# Patient Record
Sex: Female | Born: 1995 | Race: White | Hispanic: No | Smoking: Never smoker
Health system: Southern US, Community
[De-identification: ages and names within clinical notes are randomized; demographics above are authoritative.]

## PROBLEM LIST (undated history)

## (undated) ENCOUNTER — Inpatient Hospital Stay: Payer: Self-pay

## (undated) DIAGNOSIS — G930 Cerebral cysts: Secondary | ICD-10-CM

## (undated) DIAGNOSIS — F419 Anxiety disorder, unspecified: Secondary | ICD-10-CM

## (undated) DIAGNOSIS — M419 Scoliosis, unspecified: Secondary | ICD-10-CM

## (undated) DIAGNOSIS — R55 Syncope and collapse: Secondary | ICD-10-CM

## (undated) DIAGNOSIS — G43909 Migraine, unspecified, not intractable, without status migrainosus: Secondary | ICD-10-CM

## (undated) DIAGNOSIS — A1801 Tuberculosis of spine: Secondary | ICD-10-CM

## (undated) DIAGNOSIS — S82009A Unspecified fracture of unspecified patella, initial encounter for closed fracture: Secondary | ICD-10-CM

## (undated) DIAGNOSIS — R519 Headache, unspecified: Secondary | ICD-10-CM

## (undated) DIAGNOSIS — R000 Tachycardia, unspecified: Secondary | ICD-10-CM

## (undated) DIAGNOSIS — Z8489 Family history of other specified conditions: Secondary | ICD-10-CM

## (undated) DIAGNOSIS — Z87440 Personal history of urinary (tract) infections: Secondary | ICD-10-CM

## (undated) DIAGNOSIS — R569 Unspecified convulsions: Secondary | ICD-10-CM

## (undated) DIAGNOSIS — I499 Cardiac arrhythmia, unspecified: Secondary | ICD-10-CM

## (undated) DIAGNOSIS — R45851 Suicidal ideations: Secondary | ICD-10-CM

## (undated) DIAGNOSIS — R51 Headache: Secondary | ICD-10-CM

## (undated) DIAGNOSIS — R011 Cardiac murmur, unspecified: Secondary | ICD-10-CM

## (undated) HISTORY — DX: Unspecified fracture of unspecified patella, initial encounter for closed fracture: S82.009A

## (undated) HISTORY — DX: Migraine, unspecified, not intractable, without status migrainosus: G43.909

## (undated) HISTORY — DX: Tachycardia, unspecified: R00.0

## (undated) HISTORY — DX: Cardiac murmur, unspecified: R01.1

## (undated) HISTORY — DX: Syncope and collapse: R55

## (undated) HISTORY — DX: Scoliosis, unspecified: M41.9

## (undated) HISTORY — DX: Personal history of urinary (tract) infections: Z87.440

## (undated) HISTORY — DX: Suicidal ideations: R45.851

## (undated) HISTORY — DX: Headache: R51

## (undated) HISTORY — DX: Headache, unspecified: R51.9

---
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
## 2015-01-07 ENCOUNTER — Ambulatory Visit (INDEPENDENT_AMBULATORY_CARE_PROVIDER_SITE_OTHER): Payer: 59 | Admitting: Obstetrics and Gynecology

## 2015-01-07 ENCOUNTER — Encounter: Payer: Self-pay | Admitting: Obstetrics and Gynecology

## 2015-01-07 VITALS — BP 106/71 | HR 76 | Wt 168.2 lb

## 2015-01-07 DIAGNOSIS — Z3493 Encounter for supervision of normal pregnancy, unspecified, third trimester: Secondary | ICD-10-CM

## 2015-01-07 LAB — POCT URINALYSIS DIPSTICK
Bilirubin, UA: NEGATIVE
Blood, UA: NEGATIVE
Glucose, UA: NEGATIVE
Ketones, UA: NEGATIVE
LEUKOCYTES UA: NEGATIVE
Nitrite, UA: NEGATIVE
Spec Grav, UA: 1.01
UROBILINOGEN UA: 0.2
pH, UA: 8

## 2015-01-07 NOTE — Patient Instructions (Signed)

## 2015-01-07 NOTE — Progress Notes (Signed)
ROB- c/o fatigue- OK to take extra B12, glucola next visit- info given to enroll in [facility], [facility] and infant care class

## 2015-01-07 NOTE — Progress Notes (Signed)
ROB- pt thinks she has internal hemorrhoid, she is having some pain in R groin

## 2015-01-21 ENCOUNTER — Observation Stay
Admission: EM | Admit: 2015-01-21 | Discharge: 2015-01-21 | Disposition: A | Discharge status: Home / Self Care | Payer: Medicaid Other | Attending: Obstetrics and Gynecology | Admitting: Obstetrics and Gynecology

## 2015-01-21 DIAGNOSIS — Z3A27 27 weeks gestation of pregnancy: Secondary | ICD-10-CM | POA: Diagnosis not present

## 2015-01-21 DIAGNOSIS — O368120 Decreased fetal movements, second trimester, not applicable or unspecified: Secondary | ICD-10-CM | POA: Diagnosis present

## 2015-01-21 NOTE — Discharge Instructions (Signed)
Get lots of rest and drink plenty of water. Call your doctor with any questions or concerns.  Braxton Hicks Contractions Contractions of the uterus can occur throughout pregnancy. Contractions are not always a sign that you are in labor.  WHAT ARE BRAXTON HICKS CONTRACTIONS?  Contractions that occur before labor are called Braxton Hicks contractions, or false labor. Toward the end of pregnancy (32-34 weeks), these contractions can develop more often and may become more forceful. This is not true labor because these contractions do not result in opening (dilatation) and thinning of the cervix. They are sometimes difficult to tell apart from true labor because these contractions can be forceful and people have different pain tolerances. You should not feel embarrassed if you go to the hospital with false labor. Sometimes, the only way to tell if you are in true labor is for your health care provider to look for changes in the cervix. If there are no prenatal problems or other health problems associated with the pregnancy, it is completely safe to be sent home with false labor and await the onset of true labor. HOW CAN YOU TELL THE DIFFERENCE BETWEEN TRUE AND FALSE LABOR? False Labor  The contractions of false labor are usually shorter and not as hard as those of true labor.   The contractions are usually irregular.   The contractions are often felt in the front of the lower abdomen and in the groin.   The contractions may go away when you walk around or change positions while lying down.   The contractions get weaker and are shorter lasting as time goes on.   The contractions do not usually become progressively stronger, regular, and closer together as with true labor.  True Labor  Contractions in true labor last 30-70 seconds, become very regular, usually become more intense, and increase in frequency.   The contractions do not go away with walking.   The discomfort is usually felt  in the top of the uterus and spreads to the lower abdomen and low back.   True labor can be determined by your health care provider with an exam. This will show that the cervix is dilating and getting thinner.  WHAT TO REMEMBER  Keep up with your usual exercises and follow other instructions given by your health care provider.   Take medicines as directed by your health care provider.   Keep your regular prenatal appointments.   Eat and drink lightly if you think you are going into labor.   If Braxton Hicks contractions are making you uncomfortable:   Change your position from lying down or resting to walking, or from walking to resting.   Sit and rest in a tub of warm water.   Drink 2-3 glasses of water. Dehydration may cause these contractions.   Do slow and deep breathing several times an hour.  WHEN SHOULD I SEEK IMMEDIATE MEDICAL CARE? Seek immediate medical care if:  Your contractions become stronger, more regular, and closer together.   You have fluid leaking or gushing from your vagina.   You have a fever.   You pass blood-tinged mucus.   You have vaginal bleeding.   You have continuous abdominal pain.   You have low back pain that you never had before.   You feel your baby's head pushing down and causing pelvic pressure.   Your baby is not moving as much as it used to.    This information is not intended to replace advice given to  you by your health care provider. Make sure you discuss any questions you have with your health care provider.   Document Released: 02/07/2005 Document Revised: 02/12/2013 Document Reviewed: 11/19/2012 Elsevier Interactive Patient Education 2016 [place]. Fetal Movement Counts Patient Name: __________________________________________________ Patient Due Date: ____________________ Performing a fetal movement count is highly recommended in high-risk pregnancies, but it is good for every pregnant woman to do.  Your health care provider may ask you to start counting fetal movements at 28 weeks of the pregnancy. Fetal movements often increase:  After eating a full meal.  After physical activity.  After eating or drinking something sweet or cold.  At rest. Pay attention to when you feel the baby is most active. This will help you notice a pattern of your baby's sleep and wake cycles and what factors contribute to an increase in fetal movement. It is important to perform a fetal movement count at the same time each day when your baby is normally most active.  HOW TO COUNT FETAL MOVEMENTS  Find a quiet and comfortable area to sit or lie down on your left side. Lying on your left side provides the best blood and oxygen circulation to your baby.  Write down the day and time on a sheet of paper or in a journal.  Start counting kicks, flutters, swishes, rolls, or jabs in a 2-hour period. You should feel at least 10 movements within 2 hours.  If you do not feel 10 movements in 2 hours, wait 2-3 hours and count again. Look for a change in the pattern or not enough counts in 2 hours. SEEK MEDICAL CARE IF:  You feel less than 10 counts in 2 hours, tried twice.  There is no movement in over an hour.  The pattern is changing or taking longer each day to reach 10 counts in 2 hours.  You feel the baby is not moving as he or she usually does. Date: ____________ Movements: ____________ Start time: ____________ Elizebeth Koller time: ____________  Date: ____________ Movements: ____________ Start time: ____________ Elizebeth Koller time: ____________ Date: ____________ Movements: ____________ Start time: ____________ Elizebeth Koller time: ____________ Date: ____________ Movements: ____________ Start time: ____________ Elizebeth Koller time: ____________ Date: ____________ Movements: ____________ Start time: ____________ Elizebeth Koller time: ____________ Date: ____________ Movements: ____________ Start time: ____________ Elizebeth Koller time: ____________ Date:  ____________ Movements: ____________ Start time: ____________ Elizebeth Koller time: ____________ Date: ____________ Movements: ____________ Start time: ____________ Elizebeth Koller time: ____________  Date: ____________ Movements: ____________ Start time: ____________ Elizebeth Koller time: ____________ Date: ____________ Movements: ____________ Start time: ____________ Elizebeth Koller time: ____________ Date: ____________ Movements: ____________ Start time: ____________ Elizebeth Koller time: ____________ Date: ____________ Movements: ____________ Start time: ____________ Elizebeth Koller time: ____________ Date: ____________ Movements: ____________ Start time: ____________ Elizebeth Koller time: ____________ Date: ____________ Movements: ____________ Start time: ____________ Elizebeth Koller time: ____________ Date: ____________ Movements: ____________ Start time: ____________ Elizebeth Koller time: ____________  Date: ____________ Movements: ____________ Start time: ____________ Elizebeth Koller time: ____________ Date: ____________ Movements: ____________ Start time: ____________ Elizebeth Koller time: ____________ Date: ____________ Movements: ____________ Start time: ____________ Elizebeth Koller time: ____________ Date: ____________ Movements: ____________ Start time: ____________ Elizebeth Koller time: ____________ Date: ____________ Movements: ____________ Start time: ____________ Elizebeth Koller time: ____________ Date: ____________ Movements: ____________ Start time: ____________ Elizebeth Koller time: ____________ Date: ____________ Movements: ____________ Start time: ____________ Elizebeth Koller time: ____________  Date: ____________ Movements: ____________ Start time: ____________ Elizebeth Koller time: ____________ Date: ____________ Movements: ____________ Start time: ____________ Elizebeth Koller time: ____________ Date: ____________ Movements: ____________ Start time: ____________ Elizebeth Koller time: ____________ Date: ____________ Movements: ____________ Start time: ____________ Elizebeth Koller time: ____________ Date: ____________  Movements: ____________ Start  time: ____________ Elizebeth Koller time: ____________ Date: ____________ Movements: ____________ Start time: ____________ Elizebeth Koller time: ____________ Date: ____________ Movements: ____________ Start time: ____________ Elizebeth Koller time: ____________  Date: ____________ Movements: ____________ Start time: ____________ Elizebeth Koller time: ____________ Date: ____________ Movements: ____________ Start time: ____________ Elizebeth Koller time: ____________ Date: ____________ Movements: ____________ Start time: ____________ Elizebeth Koller time: ____________ Date: ____________ Movements: ____________ Start time: ____________ Elizebeth Koller time: ____________ Date: ____________ Movements: ____________ Start time: ____________ Elizebeth Koller time: ____________ Date: ____________ Movements: ____________ Start time: ____________ Elizebeth Koller time: ____________ Date: ____________ Movements: ____________ Start time: ____________ Elizebeth Koller time: ____________  Date: ____________ Movements: ____________ Start time: ____________ Elizebeth Koller time: ____________ Date: ____________ Movements: ____________ Start time: ____________ Elizebeth Koller time: ____________ Date: ____________ Movements: ____________ Start time: ____________ Elizebeth Koller time: ____________ Date: ____________ Movements: ____________ Start time: ____________ Elizebeth Koller time: ____________ Date: ____________ Movements: ____________ Start time: ____________ Elizebeth Koller time: ____________ Date: ____________ Movements: ____________ Start time: ____________ Elizebeth Koller time: ____________ Date: ____________ Movements: ____________ Start time: ____________ Elizebeth Koller time: ____________  Date: ____________ Movements: ____________ Start time: ____________ Elizebeth Koller time: ____________ Date: ____________ Movements: ____________ Start time: ____________ Elizebeth Koller time: ____________ Date: ____________ Movements: ____________ Start time: ____________ Elizebeth Koller time: ____________ Date: ____________ Movements: ____________ Start time: ____________ Elizebeth Koller time:  ____________ Date: ____________ Movements: ____________ Start time: ____________ Elizebeth Koller time: ____________ Date: ____________ Movements: ____________ Start time: ____________ Elizebeth Koller time: ____________ Date: ____________ Movements: ____________ Start time: ____________ Elizebeth Koller time: ____________  Date: ____________ Movements: ____________ Start time: ____________ Elizebeth Koller time: ____________ Date: ____________ Movements: ____________ Start time: ____________ Elizebeth Koller time: ____________ Date: ____________ Movements: ____________ Start time: ____________ Elizebeth Koller time: ____________ Date: ____________ Movements: ____________ Start time: ____________ Elizebeth Koller time: ____________ Date: ____________ Movements: ____________ Start time: ____________ Elizebeth Koller time: ____________ Date: ____________ Movements: ____________ Start time: ____________ Elizebeth Koller time: ____________   This information is not intended to replace advice given to you by your health care provider. Make sure you discuss any questions you have with your health care provider.   Document Released: 03/09/2006 Document Revised: 02/28/2014 Document Reviewed: 12/05/2011 Elsevier Interactive Patient Education [employer].

## 2015-01-21 NOTE — OB Triage Note (Signed)
Pt arrived complaining of no fetal movement today. Pt states that she fell onto her abdomen on her mattress yesterday night (01/20/15) between 9-10 pm. Pt Denies any bleeding or fluid leakage and no complaints of pain.

## 2015-01-23 NOTE — OB Triage Provider Note (Signed)
L&D OB Triage Note  Sarah Haney is a 19 y.o. G2P0 female at [redacted]w[redacted]d, EDD Estimated Date of Delivery: 04/20/15 who presented to triage for complaints of decreased fetal mov't.  She was evaluated by the nurses with no significant findings/findings significant for concern as fetus started moving like normal when she got on the unit. Vital signs stable. An NST was performed and has been reviewed by myself.   NST INTERPRETATION: Indications: decreased fetal movement  Mode: External Baseline Rate (A): 125 bpm Variability: Moderate Accelerations: 15 x 15 Decelerations: None        Impression: reactive   Plan: NST performed was reviewed and was found to be reactive. She was discharged home with bleeding/labor precautions.  Continue routine prenatal care. Follow up with OB/GYN as previously scheduled.     [name] Ghee, CNM

## 2015-01-27 ENCOUNTER — Other Ambulatory Visit: Payer: Self-pay | Admitting: *Deleted

## 2015-01-27 DIAGNOSIS — Z131 Encounter for screening for diabetes mellitus: Secondary | ICD-10-CM

## 2015-01-27 DIAGNOSIS — Z3493 Encounter for supervision of normal pregnancy, unspecified, third trimester: Secondary | ICD-10-CM

## 2015-01-28 ENCOUNTER — Encounter: Payer: Self-pay | Admitting: Obstetrics and Gynecology

## 2015-01-28 ENCOUNTER — Ambulatory Visit (INDEPENDENT_AMBULATORY_CARE_PROVIDER_SITE_OTHER): Payer: 59 | Admitting: Obstetrics and Gynecology

## 2015-01-28 VITALS — BP 112/62 | HR 88

## 2015-01-28 DIAGNOSIS — Z23 Encounter for immunization: Secondary | ICD-10-CM

## 2015-01-28 DIAGNOSIS — Z3493 Encounter for supervision of normal pregnancy, unspecified, third trimester: Secondary | ICD-10-CM

## 2015-01-28 MED ORDER — TETANUS-DIPHTH-ACELL PERTUSSIS 5-2.5-18.5 LF-MCG/0.5 IM SUSP
0.5000 mL | Freq: Once | INTRAMUSCULAR | Status: AC
  Administered 2015-01-28: 0.5 mL via INTRAMUSCULAR

## 2015-01-28 NOTE — Patient Instructions (Addendum)
Third Trimester of Pregnancy The third trimester is from week 29 through week 42, months 7 through 9. The third trimester is a time when the fetus is growing rapidly. At the end of the ninth month, the fetus is about 20 inches in length and weighs 6-10 pounds.  BODY CHANGES Your body goes through many changes during pregnancy. The changes vary from woman to woman.   Your weight will continue to increase. You can expect to gain 25-35 pounds (11-16 kg) by the end of the pregnancy.  You may begin to get stretch marks on your hips, abdomen, and breasts.  You may urinate more often because the fetus is moving lower into your pelvis and pressing on your bladder.  You may develop or continue to have heartburn as a result of your pregnancy.  You may develop constipation because certain hormones are causing the muscles that push waste through your intestines to slow down.  You may develop hemorrhoids or swollen, bulging veins (varicose veins).  You may have pelvic pain because of the weight gain and pregnancy hormones relaxing your joints between the bones in your pelvis. Backaches may result from overexertion of the muscles supporting your posture.  You may have changes in your hair. These can include thickening of your hair, rapid growth, and changes in texture. Some women also have hair loss during or after pregnancy, or hair that feels dry or thin. Your hair will most likely return to normal after your baby is born.  Your breasts will continue to grow and be tender. A yellow discharge may leak from your breasts called colostrum.  Your belly button may stick out.  You may feel short of breath because of your expanding uterus.  You may notice the fetus "dropping," or moving lower in your abdomen.  You may have a bloody mucus discharge. This usually occurs a few days to a week before labor begins.  Your cervix becomes thin and soft (effaced) near your due date. WHAT TO EXPECT AT YOUR PRENATAL  EXAMS  You will have prenatal exams every 2 weeks until week 36. Then, you will have weekly prenatal exams. During a routine prenatal visit:  You will be weighed to make sure you and the fetus are growing normally.  Your blood pressure is taken.  Your abdomen will be measured to track your baby's growth.  The fetal heartbeat will be listened to.  Any test results from the previous visit will be discussed.  You may have a cervical check near your due date to see if you have effaced. At around 36 weeks, your caregiver will check your cervix. At the same time, your caregiver will also perform a test on the secretions of the vaginal tissue. This test is to determine if a type of bacteria, Group B streptococcus, is present. Your caregiver will explain this further. Your caregiver may ask you:  What your birth plan is.  How you are feeling.  If you are feeling the baby move.  If you have had any abnormal symptoms, such as leaking fluid, bleeding, severe headaches, or abdominal cramping.  If you are using any tobacco products, including cigarettes, chewing tobacco, and electronic cigarettes.  If you have any questions. Other tests or screenings that may be performed during your third trimester include:  Blood tests that check for low iron levels (anemia).  Fetal testing to check the health, activity level, and growth of the fetus. Testing is done if you have certain medical conditions or if  there are problems during the pregnancy.  HIV (human immunodeficiency virus) testing. If you are at high risk, you may be screened for HIV during your third trimester of pregnancy. FALSE LABOR You may feel small, irregular contractions that eventually go away. These are called Braxton Hicks contractions, or false labor. Contractions may last for hours, days, or even weeks before true labor sets in. If contractions come at regular intervals, intensify, or become painful, it is best to be seen by your  caregiver.  SIGNS OF LABOR   Menstrual-like cramps.  Contractions that are 5 minutes apart or less.  Contractions that start on the top of the uterus and spread down to the lower abdomen and back.  A sense of increased pelvic pressure or back pain.  A watery or bloody mucus discharge that comes from the vagina. If you have any of these signs before the 37th week of pregnancy, call your caregiver right away. You need to go to the hospital to get checked immediately. HOME CARE INSTRUCTIONS   Avoid all smoking, herbs, alcohol, and unprescribed drugs. These chemicals affect the formation and growth of the baby.  Do not use any tobacco products, including cigarettes, chewing tobacco, and electronic cigarettes. If you need help quitting, ask your health care provider. You may receive counseling support and other resources to help you quit.  Follow your caregiver's instructions regarding medicine use. There are medicines that are either safe or unsafe to take during pregnancy.  Exercise only as directed by your caregiver. Experiencing uterine cramps is a good sign to stop exercising.  Continue to eat regular, healthy meals.  Wear a good support bra for breast tenderness.  Do not use hot tubs, steam rooms, or saunas.  Wear your seat belt at all times when driving.  Avoid raw meat, uncooked cheese, cat litter boxes, and soil used by cats. These carry germs that can cause birth defects in the baby.  Take your prenatal vitamins.  Take 1500-2000 mg of calcium daily starting at the 20th week of pregnancy until you deliver your baby.  Try taking a stool softener (if your caregiver approves) if you develop constipation. Eat more high-fiber foods, such as fresh vegetables or fruit and whole grains. Drink plenty of fluids to keep your urine clear or pale yellow.  Take warm sitz baths to soothe any pain or discomfort caused by hemorrhoids. Use hemorrhoid cream if your caregiver approves.  If  you develop varicose veins, wear support hose. Elevate your feet for 15 minutes, 3-4 times a day. Limit salt in your diet.  Avoid heavy lifting, wear low heal shoes, and practice good posture.  Rest a lot with your legs elevated if you have leg cramps or low back pain.  Visit your dentist if you have not gone during your pregnancy. Use a soft toothbrush to brush your teeth and be gentle when you floss.  A sexual relationship may be continued unless your caregiver directs you otherwise.  Do not travel far distances unless it is absolutely necessary and only with the approval of your caregiver.  Take prenatal classes to understand, practice, and ask questions about the labor and delivery.  Make a trial run to the hospital.  Pack your hospital bag.  Prepare the baby's nursery.  Continue to go to all your prenatal visits as directed by your caregiver. SEEK MEDICAL CARE IF:  You are unsure if you are in labor or if your water has broken.  You have dizziness.  You have  mild pelvic cramps, pelvic pressure, or nagging pain in your abdominal area.  You have persistent nausea, vomiting, or diarrhea.  You have a bad smelling vaginal discharge.  You have pain with urination. SEEK IMMEDIATE MEDICAL CARE IF:   You have a fever.  You are leaking fluid from your vagina.  You have spotting or bleeding from your vagina.  You have severe abdominal cramping or pain.  You have rapid weight loss or gain.  You have shortness of breath with chest pain.  You notice sudden or extreme swelling of your face, hands, ankles, feet, or legs.  You have not felt your baby move in over an hour.  You have severe headaches that do not go away with medicine.  You have vision changes.   This information is not intended to replace advice given to you by your health care provider. Make sure you discuss any questions you have with your health care provider.   Document Released: 02/01/2001 Document  Revised: 02/28/2014 Document Reviewed: 04/10/2012 Elsevier Interactive Patient Education 2016 [place] you for enrolling in [place]. Please follow the instructions below to securely access your online medical record. MyChart allows you to send messages to your doctor, view your test results, renew your prescriptions, schedule appointments, and more.  How Do I Sign Up? 1. In your Internet browser, go to [email or web address]. 2. Click on the New  User? link in the Sign In box.  3. Enter your MyChart Access Code exactly as it appears below. You will not need to use this code after you have completed the sign-up process. If you do not sign up before the expiration date, you must request a new code. MyChart Access Code: [email or web address] Expires: 03/29/2015 11:50 AM  4. Enter the last four digits of your Social Security Number (xxxx) and Date of Birth (mm/dd/yyyy) as indicated and click Next. You will be taken to the next sign-up page. 5. Create a MyChart ID. This will be your MyChart login ID and cannot be changed, so think of one that is secure and easy to remember. 6. Create a MyChart password. You can change your password at any time. 7. Enter your Password Reset Question and Answer and click Next. This can be used at a later time if you forget your password.  8. Select your communication preference, and if applicable enter your e-mail address. You will receive e-mail notification when new information is available in MyChart by choosing to receive e-mail notifications and filling in your e-mail. 9. Click Sign In. You can now view your medical record.   Additional Information If you have questions, you can email [email or web address] or call [phone] to talk to our [place] staff. Remember, MyChart is NOT to be used for urgent needs. For medical emergencies, dial 911.

## 2015-01-28 NOTE — Progress Notes (Signed)
ROB-glucola done, blood consent signed, tdap given Pt is having LLQ pain

## 2015-01-28 NOTE — Progress Notes (Signed)
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

## 2015-01-29 ENCOUNTER — Other Ambulatory Visit: Payer: Self-pay | Admitting: Obstetrics and Gynecology

## 2015-01-29 DIAGNOSIS — O99810 Abnormal glucose complicating pregnancy: Secondary | ICD-10-CM

## 2015-01-29 LAB — HEMOGLOBIN AND HEMATOCRIT, BLOOD
HEMATOCRIT: 32.3 % — AB (ref 34.0–46.6)
HEMOGLOBIN: 10.9 g/dL — AB (ref 11.1–15.9)

## 2015-01-29 LAB — GLUCOSE, 1 HOUR GESTATIONAL: GESTATIONAL DIABETES SCREEN: 139 mg/dL (ref 65–139)

## 2015-02-03 ENCOUNTER — Other Ambulatory Visit: Payer: Self-pay | Admitting: Obstetrics and Gynecology

## 2015-02-03 ENCOUNTER — Other Ambulatory Visit: Payer: 59

## 2015-02-03 DIAGNOSIS — O99810 Abnormal glucose complicating pregnancy: Secondary | ICD-10-CM

## 2015-02-04 LAB — GESTATIONAL GLUCOSE TOLERANCE
GLUCOSE 1 HOUR GTT: 141 mg/dL (ref 65–179)
GLUCOSE FASTING: 76 mg/dL (ref 65–94)
Glucose, GTT - 2 Hour: 124 mg/dL (ref 65–154)
Glucose, GTT - 3 Hour: 105 mg/dL (ref 65–139)

## 2015-02-10 ENCOUNTER — Encounter: Payer: Self-pay | Admitting: Obstetrics and Gynecology

## 2015-02-12 ENCOUNTER — Ambulatory Visit (INDEPENDENT_AMBULATORY_CARE_PROVIDER_SITE_OTHER): Payer: 59 | Admitting: Obstetrics and Gynecology

## 2015-02-12 ENCOUNTER — Encounter: Payer: Self-pay | Admitting: Obstetrics and Gynecology

## 2015-02-12 VITALS — BP 104/71 | HR 84 | Wt 176.2 lb

## 2015-02-12 DIAGNOSIS — Z3493 Encounter for supervision of normal pregnancy, unspecified, third trimester: Secondary | ICD-10-CM

## 2015-02-12 DIAGNOSIS — R1031 Right lower quadrant pain: Secondary | ICD-10-CM

## 2015-02-12 NOTE — Progress Notes (Signed)
ROB-  Counseled on V2 supporter for groin pain- patient tearful at today's visit due to pain, note given to excuse from work missed yesterday. PT referral placed also.Ibuprofen 600mg  ok is for next few days.

## 2015-02-12 NOTE — Progress Notes (Signed)
ROB- pt c/o R groin pain, pulling sensation, states it hurts badly

## 2015-02-12 NOTE — Patient Instructions (Signed)
Groin Strain  A groin strain (also called a groin pull) is an injury to the muscles or tendon on the upper inner part of the thigh. These muscles are called the adductor muscles or groin muscles. They are responsible for moving the leg across the body. A muscle strain occurs when a muscle is overstretched and some muscle fibers are torn. A groin strain can range from mild to severe depending on how many muscle fibers are affected and whether the muscle fibers are partially or completely torn.   Groin strains usually occur during exercise or participation in sports. The injury often happens when a sudden, violent force is placed on a muscle, stretching the muscle too far. A strain is more likely to occur when your muscles are not warmed up or if you are not properly conditioned. Depending on the severity of the groin strain, recovery time may vary from a few weeks to several weeks. Severe injuries often require 4-6 weeks for recovery. In these cases, complete healing can take 4-5 months.   CAUSES    Stretching the groin muscles too far or too suddenly, often during side-to-side motion with an abrupt change in direction.   Putting repeated stress on the groin muscles over a long period of time.   Performing vigorous activity without properly stretching the groin muscles beforehand.  SYMPTOMS    Pain and tenderness in the groin area. This begins as sharp pain and persists as a dull ache.   Popping or snapping feeling when the injury occurs (for severe strains).   Swelling or bruising.   Muscle spasms.   Weakness in the leg.   Stiffness in the groin area with decreased ability to move the affected muscles.  DIAGNOSIS   Your caregiver will perform a physical exam to diagnose a groin strain. You will be asked about your symptoms and how the injury occurred. X-rays are sometimes needed to rule out a broken bone or cartilage problems. Your caregiver may order a CT scan or MRI if a complete muscle tear is  suspected.  TREATMENT   A groin strain will often heal on its own. Your caregiver may prescribe medicines to help manage pain and swelling (anti-inflammatory medicine). You may be told to use crutches for the first few days to minimize your pain.  HOME CARE INSTRUCTIONS    Rest. Do not use the strained muscle if it causes pain.   Put ice on the injured area.   Put ice in a plastic bag.   Place a towel between your skin and the bag.   Leave the ice on for 15-20 minutes, every 2-3 hours. Do this for the first 2 days after the injury.   Only take over-the-counter or prescription medicines as directed by your caregiver.   Wrap the injured area with an elastic bandage as directed by your caregiver.   Keep the injured leg raised (elevated).   Walk, stretch, and perform range-of-motion exercises to improve blood flow to the injured area. Only perform these activities if you can do so without any pain.  To prevent muscle strains:   Warm up before exercise.   Develop proper conditioning and strength in the groin muscles.  SEEK IMMEDIATE MEDICAL CARE IF:    You have increased pain or swelling in the affected area.    Your symptoms are not improving or are getting worse.  MAKE SURE YOU:    Understand these instructions.   Will watch your condition.   Will get help   right away if you are not doing well or get worse.     This information is not intended to replace advice given to you by your health care provider. Make sure you discuss any questions you have with your health care provider.     Document Released: 10/06/2003 Document Revised: 01/25/2012 Document Reviewed: 10/12/2011  Elsevier Interactive Patient Education 2016 Elsevier Inc.

## 2015-02-22 NOTE — L&D Delivery Note (Signed)
Delivery Summary for [facility]  Labor Events:   Preterm labor:   Rupture date:   Rupture time:   Rupture type: Spontaneous  Fluid Color: Clear  Induction:   Augmentation:   Complications:   Cervical ripening:          Delivery:   Episiotomy:   Lacerations:   Repair suture:   Repair # of packets:   Blood loss (ml):    Information for the patient's newborn:  Celeste, Gong [id number]    Delivery 04/30/2015 10:23 AM by  Vaginal, Spontaneous Delivery Sex:  female Gestational Age: [redacted]w[redacted]d Delivery Clinician:  Melody N Shambley Living?: Yes        APGARS  One minute Five minutes Ten minutes  Skin color: 0   1      Heart rate: 2   2      Grimace: 2   2      Muscle tone: 1   2      Breathing: 2   2      Totals: 7  9      Presentation/position: Vertex     Resuscitation: None  Cord information: 3 vessels   Disposition of cord blood: No    Blood gases sent? No Complications: None  Placenta: Delivered: 04/30/2015 10:36 AM  Spontaneous  Intact appearance Newborn Measurements: Weight: 8 lb 7.5 oz (3840 g)  Height: 20.87"  Head circumference: 35 cm  Chest circumference: 35 cm  Other providers: Delivery Nurse Transition RN Valda Favia Tiffany D [place]otte  Additional  information: Forceps:   Vacuum:   Breech:   Observed anomalies         Delivery Note At  a viable and healthy female was delivered via  (Presentation:OA ;  ).  APGAR:8 ,9 ; weight  .8#7oz   Placenta status:delivered intact with 3 vessel Cord:  with the following complications:none .    Anesthesia:  epidural Episiotomy:  none Lacerations:  1st degree on right side Suture Repair: 3.0 vicryl rapide Est. Blood Loss (mL):  400- due to uterine atony-241mcg Cytotec placed rectally  Mom to postpartum.  Baby to Couplet care / Skin to Skin.  Melody N Shambley,CNM 04/30/2015, 10:47 AM

## 2015-02-26 ENCOUNTER — Ambulatory Visit (INDEPENDENT_AMBULATORY_CARE_PROVIDER_SITE_OTHER): Payer: 59 | Admitting: Obstetrics and Gynecology

## 2015-02-26 ENCOUNTER — Encounter: Payer: Self-pay | Admitting: Obstetrics and Gynecology

## 2015-02-26 VITALS — BP 112/78 | HR 98 | Wt 181.2 lb

## 2015-02-26 DIAGNOSIS — Z331 Pregnant state, incidental: Secondary | ICD-10-CM

## 2015-02-26 NOTE — Progress Notes (Signed)
ROB-pt is still c/o R groin pain

## 2015-02-26 NOTE — Progress Notes (Signed)
ROB-to see PT tomorrow. Plans breastfeeding, and condoms use PP, OK with all pain med options in labor

## 2015-02-27 ENCOUNTER — Ambulatory Visit: Payer: 59 | Attending: Obstetrics and Gynecology | Admitting: Physical Therapy

## 2015-02-27 ENCOUNTER — Encounter: Payer: Self-pay | Admitting: Physical Therapy

## 2015-02-27 DIAGNOSIS — R29898 Other symptoms and signs involving the musculoskeletal system: Secondary | ICD-10-CM | POA: Diagnosis not present

## 2015-02-27 DIAGNOSIS — R279 Unspecified lack of coordination: Secondary | ICD-10-CM

## 2015-02-27 NOTE — Patient Instructions (Signed)
Standing posture (handout )   Adductor squeeze with pillow  : sidelying with rolled towel under belly  5 sec with exhale 10x 3   Band on thighs  pull apart band seated:  10 x 3
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
## 2015-03-04 ENCOUNTER — Encounter: Payer: Self-pay | Admitting: Obstetrics and Gynecology

## 2015-03-09 ENCOUNTER — Ambulatory Visit: Payer: 59 | Admitting: Physical Therapy

## 2015-03-09 ENCOUNTER — Encounter: Payer: Self-pay | Admitting: Obstetrics and Gynecology

## 2015-03-11 ENCOUNTER — Ambulatory Visit: Payer: 59 | Admitting: Physical Therapy

## 2015-03-11 DIAGNOSIS — R29898 Other symptoms and signs involving the musculoskeletal system: Secondary | ICD-10-CM

## 2015-03-11 DIAGNOSIS — R279 Unspecified lack of coordination: Secondary | ICD-10-CM

## 2015-03-11 NOTE — Patient Instructions (Signed)
pelvic tilts 10-20 sidelying      Cat-cow pose  10-20 x morning and night       Sit to stand with knees alignment Standing with knees unlocked and pelvic neutral      PELVIC FLOOR / KEGEL EXERCISES   Pelvic floor/ Kegel exercises are used to strengthen the muscles in the base of your pelvis that are responsible for supporting your pelvic organs and preventing urine/feces leakage. Based on your therapist's recommendations, they can be performed while standing, sitting, or lying down. Imagine pelvic floor area as a diamond with pelvic landmarks: top =pubic bone, bottom tip=tailbone, sides=sitting bones (ischial tuberosities).    Make yourself aware of this muscle group by using these cues while coordinating your breath:  Inhale, feel pelvic floor diamond area lower like hammock towards your feet and ribcage/belly expanding. Pause. Let the exhale naturally and feel your belly sink, abdominal muscles hugging in around you and you may notice the pelvic diamond draws upward towards your head forming a umbrella shape. Give a squeeze during the exhalation like you are stopping the flow of urine. If you are squeezing the buttock muscles, try to give 50% less effort.   Common Errors:  Breath holding: If you are holding your breath, you may be bearing down against your bladder instead of pulling it up. If you belly bulges up while you are squeezing, you are holding your breath. Be sure to breathe gently in and out while exercising. Counting out loud may help you avoid holding your breath.  Accessory muscle use: You should not see or feel other muscle movement when performing pelvic floor exercises. When done properly, no one can tell that you are performing the exercises. Keep the buttocks, belly and inner thighs relaxed.  Overdoing it: Your muscles can fatigue and stop working for you if you over-exercise. You may actually leak more or feel soreness at the lower abdomen or rectum.  YOUR  HOME EXERCISE PROGRAM     SHORT HOLDS: Position: on back, sitting   Inhale and then exhale. Then squeeze the muscle.  (Be sure to let belly sink in with exhales and not push outward)  Perform 5 repetitions, 5  Times/day                      DECREASE DOWNWARD PRESSURE ON  YOUR PELVIC FLOOR, ABDOMINAL, LOW BACK MUSCLES       PRESERVE YOUR PELVIC HEALTH LONG-TERM   ** SQUEEZE pelvic floor BEFORE YOUR SNEEZE, COUGH, LAUGH   ** EXHALE BEFORE YOU RISE AGAINST GRAVITY (lifting, sit to stand, from squat to stand)   ** LOG ROLL OUT OF BED INSTEAD OF CRUNCH/SIT-UP
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
## 2015-03-11 NOTE — Addendum Note (Signed)
Addended by: Jerl Mina on: 03/11/2015 08:51 AM   Modules accepted: Orders

## 2015-03-12 ENCOUNTER — Ambulatory Visit (INDEPENDENT_AMBULATORY_CARE_PROVIDER_SITE_OTHER): Payer: 59 | Admitting: Obstetrics and Gynecology

## 2015-03-12 ENCOUNTER — Encounter: Payer: Self-pay | Admitting: Obstetrics and Gynecology

## 2015-03-12 VITALS — BP 101/71 | HR 81 | Wt 182.4 lb

## 2015-03-12 DIAGNOSIS — Z331 Pregnant state, incidental: Secondary | ICD-10-CM

## 2015-03-12 NOTE — Progress Notes (Signed)
ROB- going to PT, doesn't feel like it is helping much, belt makes her feel like she can't breath. Cultures next visit.

## 2015-03-12 NOTE — Progress Notes (Signed)
ROB-pt is still having pain in her groin area, having some pelvic pressure

## 2015-03-18 ENCOUNTER — Ambulatory Visit: Payer: 59 | Admitting: Physical Therapy

## 2015-03-20 ENCOUNTER — Ambulatory Visit: Payer: 59 | Admitting: Physical Therapy

## 2015-03-24 ENCOUNTER — Encounter: Payer: Self-pay | Admitting: *Deleted

## 2015-03-24 ENCOUNTER — Observation Stay
Admission: EM | Admit: 2015-03-24 | Discharge: 2015-03-24 | Disposition: A | Discharge status: Home / Self Care | Payer: 59 | Attending: Obstetrics and Gynecology | Admitting: Obstetrics and Gynecology

## 2015-03-24 DIAGNOSIS — Z3A36 36 weeks gestation of pregnancy: Secondary | ICD-10-CM | POA: Diagnosis not present

## 2015-03-24 DIAGNOSIS — O368130 Decreased fetal movements, third trimester, not applicable or unspecified: Secondary | ICD-10-CM | POA: Diagnosis present

## 2015-03-24 NOTE — Progress Notes (Signed)
Negative protein dip

## 2015-03-24 NOTE — OB Triage Note (Signed)
20 yo pt at [redacted]w[redacted]d gestation presents with c/o leaking fluid. Denies bleeding, +FM.

## 2015-03-25 ENCOUNTER — Ambulatory Visit: Payer: 59 | Admitting: Physical Therapy

## 2015-03-25 NOTE — OB Triage Provider Note (Signed)
L&D OB Triage Note  Sarah Haney is a 20 y.o. G1P0000 female at [redacted]w[redacted]d, EDD Estimated Date of Delivery: 04/20/15 who presented to triage for complaints of leaking of fluid.  She was evaluated by the nurses with no significant findings for ROM with negative NTZ and dry perineum. Vital signs stable. An NST was performed and has been reviewed by Me. She was d/c home.  NST INTERPRETATION: Indications: decreased fetal movement  Mode: External Baseline Rate (A): 135 bpm Variability: Moderate Accelerations: 15 x 15 Decelerations: None     Contraction Frequency (min): occasional  Impression: reactive   Plan: NST performed was reviewed and was found to be reactive. She was discharged home with bleeding/labor precautions.  Continue routine prenatal care. Follow up with OB/GYN as previously scheduled.     Melody Rockney Ghee, CNM

## 2015-03-26 ENCOUNTER — Ambulatory Visit (INDEPENDENT_AMBULATORY_CARE_PROVIDER_SITE_OTHER): Payer: 59 | Admitting: Obstetrics and Gynecology

## 2015-03-26 ENCOUNTER — Encounter: Payer: Self-pay | Admitting: Obstetrics and Gynecology

## 2015-03-26 VITALS — BP 120/78 | HR 95 | Wt 185.4 lb

## 2015-03-26 DIAGNOSIS — Z331 Pregnant state, incidental: Secondary | ICD-10-CM

## 2015-03-26 DIAGNOSIS — Z36 Encounter for antenatal screening of mother: Secondary | ICD-10-CM

## 2015-03-26 DIAGNOSIS — Z3685 Encounter for antenatal screening for Streptococcus B: Secondary | ICD-10-CM

## 2015-03-26 DIAGNOSIS — Z113 Encounter for screening for infections with a predominantly sexual mode of transmission: Secondary | ICD-10-CM

## 2015-03-26 LAB — POCT URINALYSIS DIPSTICK
BILIRUBIN UA: NEGATIVE
Blood, UA: NEGATIVE
Glucose, UA: NEGATIVE
KETONES UA: NEGATIVE
Leukocytes, UA: NEGATIVE
Nitrite, UA: NEGATIVE
Protein, UA: NEGATIVE
Spec Grav, UA: 1.01
Urobilinogen, UA: 0.2
pH, UA: 6.5

## 2015-03-26 NOTE — Progress Notes (Signed)
ROB-cultures obtained today, states she has a headache since last night, some pelvic pressure

## 2015-03-26 NOTE — Progress Notes (Signed)
ROB- doing OK just a lot of pelvic pains in hips. Cultures obtained. Desires to stop work- note given today; labor precautions discussed.

## 2015-03-27 ENCOUNTER — Ambulatory Visit: Payer: 59 | Attending: Obstetrics and Gynecology | Admitting: Physical Therapy

## 2015-03-28 LAB — GC/CHLAMYDIA PROBE AMP
Chlamydia trachomatis, NAA: NEGATIVE
Neisseria gonorrhoeae by PCR: NEGATIVE

## 2015-03-28 LAB — STREP GP B NAA: STREP GROUP B AG: NEGATIVE

## 2015-04-01 ENCOUNTER — Ambulatory Visit: Payer: 59 | Admitting: Physical Therapy

## 2015-04-02 ENCOUNTER — Encounter: Payer: Self-pay | Admitting: Obstetrics and Gynecology

## 2015-04-02 ENCOUNTER — Telehealth: Payer: Self-pay

## 2015-04-02 ENCOUNTER — Ambulatory Visit (INDEPENDENT_AMBULATORY_CARE_PROVIDER_SITE_OTHER): Payer: 59 | Admitting: Obstetrics and Gynecology

## 2015-04-02 VITALS — BP 131/73 | HR 94 | Wt 185.6 lb

## 2015-04-02 DIAGNOSIS — Z331 Pregnant state, incidental: Secondary | ICD-10-CM

## 2015-04-02 LAB — POCT URINALYSIS DIPSTICK
Bilirubin, UA: NEGATIVE
Glucose, UA: NEGATIVE
Ketones, UA: NEGATIVE
Leukocytes, UA: NEGATIVE
Nitrite, UA: NEGATIVE
PH UA: 7
RBC UA: NEGATIVE
SPEC GRAV UA: 1.01
UROBILINOGEN UA: 0.2

## 2015-04-02 NOTE — Telephone Encounter (Signed)
Pt dates for start of leave of work had changed from 04/20/2015 to 03/25/2015- clarified by pt. Taken out due to bilateral hip instability secondary to pregnancy. Correction sent to [employer] as requested.

## 2015-04-02 NOTE — Progress Notes (Signed)
ROB- pt is c/o some pelvic pressure

## 2015-04-02 NOTE — Progress Notes (Signed)
ROB-doing well, occassional [place], reviewed negative cultures.

## 2015-04-03 ENCOUNTER — Ambulatory Visit: Payer: 59 | Admitting: Physical Therapy

## 2015-04-09 ENCOUNTER — Encounter: Payer: Self-pay | Admitting: Obstetrics and Gynecology

## 2015-04-09 ENCOUNTER — Ambulatory Visit (INDEPENDENT_AMBULATORY_CARE_PROVIDER_SITE_OTHER): Payer: 59 | Admitting: Obstetrics and Gynecology

## 2015-04-09 ENCOUNTER — Encounter: Payer: 59 | Admitting: Obstetrics and Gynecology

## 2015-04-09 VITALS — BP 113/66 | HR 92 | Wt 189.7 lb

## 2015-04-09 DIAGNOSIS — Z3490 Encounter for supervision of normal pregnancy, unspecified, unspecified trimester: Secondary | ICD-10-CM

## 2015-04-09 DIAGNOSIS — Z331 Pregnant state, incidental: Secondary | ICD-10-CM

## 2015-04-09 LAB — POCT URINALYSIS DIPSTICK
BILIRUBIN UA: NEGATIVE
GLUCOSE UA: NEGATIVE
KETONES UA: NEGATIVE
Leukocytes, UA: NEGATIVE
NITRITE UA: NEGATIVE
PH UA: 6.5
Protein, UA: NEGATIVE
RBC UA: NEGATIVE
SPEC GRAV UA: 1.015
Urobilinogen, UA: 0.2

## 2015-04-09 NOTE — Progress Notes (Signed)
ROB- doing well, to increase fluid intake and eat freq. Small high protein snacks.

## 2015-04-09 NOTE — Progress Notes (Signed)
ROB-c/o being lightheaded, is feeling some pelvic pressure

## 2015-04-16 ENCOUNTER — Encounter: Payer: Self-pay | Admitting: Obstetrics and Gynecology

## 2015-04-16 ENCOUNTER — Ambulatory Visit (INDEPENDENT_AMBULATORY_CARE_PROVIDER_SITE_OTHER): Payer: 59 | Admitting: Obstetrics and Gynecology

## 2015-04-16 VITALS — BP 116/75 | HR 112 | Wt 187.5 lb

## 2015-04-16 DIAGNOSIS — Z331 Pregnant state, incidental: Secondary | ICD-10-CM

## 2015-04-16 LAB — POCT URINALYSIS DIPSTICK
Blood, UA: NEGATIVE
GLUCOSE UA: NEGATIVE
Ketones, UA: NEGATIVE
Leukocytes, UA: NEGATIVE
Nitrite, UA: NEGATIVE
SPEC GRAV UA: 1.01
UROBILINOGEN UA: 0.2
pH, UA: 7

## 2015-04-16 NOTE — Progress Notes (Signed)
ROB- reports irreg [place] and pelvis popping at times- causes pain and increased pressure- still won't wear maternity belt as she feels like it makes it worse- discussed postdate care and IOL at 41 weeks

## 2015-04-16 NOTE — Patient Instructions (Signed)
Nonstress Test  The nonstress test is a procedure that monitors the fetus's heartbeat. The test will monitor the heartbeat when the fetus is at rest and while the fetus is moving. In a healthy fetus, there will be an increase in fetal heart rate when the fetus moves or kicks. The heart rate will decrease at rest. This test helps determine if the fetus is healthy. Your health care provider will look at a number of patterns in the heart rate tracing to make sure your baby is thriving. If there is concern, your health care provider may order additional tests or may suggest another course of action. This test is often done in the third trimester and can help determine if an early delivery is needed and safe. Common reasons to have this test are:  · You are past your due date.  · You have a high-risk pregnancy.  · You are feeling less movement than normal.  · You have lost a pregnancy in the past.  · Your health care provider suspects fetal growth problems.  · You have too much or too little amniotic fluid.  BEFORE THE PROCEDURE  · Eat a meal right before the test or as directed by your health care provider. Food may help stimulate fetal movements.  · Use the restroom right before the test.  PROCEDURE  · Two belts will be placed around your abdomen. These belts have monitors attached to them. One records the fetal heart rate and the other records uterine contractions.  · You may be asked to lie down on your side or to stay sitting upright.  · You may be given a button to press when you feel movement.  · The fetal heartbeat is listened to and watched on a screen. The heartbeat is recorded on a sheet of paper.  · If the fetus seems to be sleeping, you may be asked to drink some juice or soda, gently press your abdomen, or make some noise to wake the fetus.  AFTER THE PROCEDURE   Your health care provider will discuss the test results with you and make recommendations for the near future.     This information is not  intended to replace advice given to you by your health care provider. Make sure you discuss any questions you have with your health care provider.     Document Released: 01/28/2002 Document Revised: 02/28/2014 Document Reviewed: 03/13/2012  Elsevier Interactive Patient Education ©2016 [employer]vier Inc[employer]

## 2015-04-16 NOTE — Progress Notes (Signed)
ROB- pt is having some contractions

## 2015-04-21 ENCOUNTER — Ambulatory Visit (INDEPENDENT_AMBULATORY_CARE_PROVIDER_SITE_OTHER): Payer: 59

## 2015-04-21 ENCOUNTER — Other Ambulatory Visit: Payer: 59

## 2015-04-21 ENCOUNTER — Ambulatory Visit (INDEPENDENT_AMBULATORY_CARE_PROVIDER_SITE_OTHER): Payer: 59 | Admitting: Obstetrics and Gynecology

## 2015-04-21 DIAGNOSIS — O480 Post-term pregnancy: Secondary | ICD-10-CM

## 2015-04-21 DIAGNOSIS — IMO0001 Reserved for inherently not codable concepts without codable children: Secondary | ICD-10-CM

## 2015-04-21 DIAGNOSIS — Z331 Pregnant state, incidental: Secondary | ICD-10-CM

## 2015-04-21 NOTE — Progress Notes (Signed)
Indications:Growth and AFI for post dates Findings:  Singleton intrauterine pregnancy is visualized with FHR at 144 BPM. Biometrics give an (U/S) Gestational age of 40 6/7 weeks and an (U/S) EDD of 04/22/15; this correlates with the clinically established EDD of 04/20/15.  Fetal presentation is Vertex.  EFW: 3836g (8lb 7oz). Placenta: Anterior, grade 1, remote to cervix. AFI: 16.6cm.  Survey of the adnexa demonstrates no adnexal masses. There is no free peritoneal fluid in the cul de sac.  Impression: 1. 39 6/7 week Viable Singleton Intrauterine pregnancy by U/S. 2. (U/S) EDD is consistent with Clinically established (LMP) EDD of 04/20/15. 3. Adequate growth and AFI  NST performed today was reviewed and was found to be reactive. Baseline128 with Moderate variability; No decels noted.  Continue recommended antenatal testing and prenatal care.

## 2015-04-24 ENCOUNTER — Ambulatory Visit (INDEPENDENT_AMBULATORY_CARE_PROVIDER_SITE_OTHER): Payer: Medicaid Other | Admitting: Obstetrics and Gynecology

## 2015-04-24 ENCOUNTER — Other Ambulatory Visit: Payer: 59

## 2015-04-24 ENCOUNTER — Encounter: Payer: Self-pay | Admitting: Obstetrics and Gynecology

## 2015-04-24 VITALS — BP 142/84 | HR 102 | Wt 186.8 lb

## 2015-04-24 DIAGNOSIS — Z331 Pregnant state, incidental: Secondary | ICD-10-CM

## 2015-04-24 DIAGNOSIS — Z3493 Encounter for supervision of normal pregnancy, unspecified, third trimester: Secondary | ICD-10-CM

## 2015-04-24 LAB — POCT URINALYSIS DIPSTICK
BILIRUBIN UA: NEGATIVE
Glucose, UA: NEGATIVE
Ketones, UA: NEGATIVE
Leukocytes, UA: NEGATIVE
NITRITE UA: NEGATIVE
PH UA: 7
RBC UA: NEGATIVE
Spec Grav, UA: 1.01
UROBILINOGEN UA: 0.2

## 2015-04-24 NOTE — Progress Notes (Signed)
ROB- NST done today

## 2015-04-24 NOTE — Progress Notes (Signed)
ROB- reports irregular mild contractions, NST reactive. Will plan IOL on 04/28/15 if not delivered

## 2015-04-26 ENCOUNTER — Encounter: Payer: Self-pay | Admitting: Obstetrics and Gynecology

## 2015-04-28 ENCOUNTER — Inpatient Hospital Stay
Admission: EM | Admit: 2015-04-28 | Discharge: 2015-05-02 | DRG: 774 | Disposition: A | Discharge status: Home / Self Care | Payer: 59 | Attending: Obstetrics and Gynecology | Admitting: Obstetrics and Gynecology

## 2015-04-28 DIAGNOSIS — Z3A41 41 weeks gestation of pregnancy: Secondary | ICD-10-CM | POA: Diagnosis not present

## 2015-04-28 DIAGNOSIS — Z3403 Encounter for supervision of normal first pregnancy, third trimester: Secondary | ICD-10-CM | POA: Diagnosis not present

## 2015-04-28 DIAGNOSIS — A084 Viral intestinal infection, unspecified: Secondary | ICD-10-CM | POA: Diagnosis present

## 2015-04-28 DIAGNOSIS — O9852 Other viral diseases complicating childbirth: Secondary | ICD-10-CM | POA: Diagnosis not present

## 2015-04-28 DIAGNOSIS — D649 Anemia, unspecified: Secondary | ICD-10-CM | POA: Diagnosis not present

## 2015-04-28 DIAGNOSIS — O9081 Anemia of the puerperium: Secondary | ICD-10-CM | POA: Diagnosis not present

## 2015-04-28 DIAGNOSIS — O480 Post-term pregnancy: Principal | ICD-10-CM | POA: Diagnosis present

## 2015-04-28 LAB — CBC
HCT: 30.4 % — ABNORMAL LOW (ref 35.0–47.0)
HEMOGLOBIN: 10.1 g/dL — AB (ref 12.0–16.0)
MCH: 27.1 pg (ref 26.0–34.0)
MCHC: 33.1 g/dL (ref 32.0–36.0)
MCV: 81.9 fL (ref 80.0–100.0)
PLATELETS: 222 10*3/uL (ref 150–440)
RBC: 3.72 MIL/uL — ABNORMAL LOW (ref 3.80–5.20)
RDW: 15.3 % — AB (ref 11.5–14.5)
WBC: 10.6 10*3/uL (ref 3.6–11.0)

## 2015-04-28 LAB — CHLAMYDIA/NGC RT PCR (ARMC ONLY)
Chlamydia Tr: NOT DETECTED
N gonorrhoeae: NOT DETECTED

## 2015-04-28 LAB — ABO/RH: ABO/RH(D): O NEG

## 2015-04-28 MED ORDER — OXYTOCIN 40 UNITS IN LACTATED RINGERS INFUSION - SIMPLE MED
1.0000 m[IU]/min | INTRAVENOUS | Status: DC
  Administered 2015-04-29: 2 m[IU]/min via INTRAVENOUS
  Filled 2015-04-28: qty 1000

## 2015-04-28 MED ORDER — LACTATED RINGERS IV SOLN
INTRAVENOUS | Status: DC
  Administered 2015-04-28 – 2015-04-29 (×4): via INTRAVENOUS

## 2015-04-28 MED ORDER — OXYTOCIN 40 UNITS IN LACTATED RINGERS INFUSION - SIMPLE MED: 2.5000 [IU]/h | INTRAVENOUS | Status: DC

## 2015-04-28 MED ORDER — OXYCODONE-ACETAMINOPHEN 5-325 MG PO TABS: 2.0000 | ORAL_TABLET | ORAL | Status: DC | PRN

## 2015-04-28 MED ORDER — OXYCODONE-ACETAMINOPHEN 5-325 MG PO TABS: 1.0000 | ORAL_TABLET | ORAL | Status: DC | PRN

## 2015-04-28 MED ORDER — OXYTOCIN BOLUS FROM INFUSION
500.0000 mL | INTRAVENOUS | Status: DC
  Administered 2015-04-30: 500 mL via INTRAVENOUS

## 2015-04-28 MED ORDER — LACTATED RINGERS IV SOLN
500.0000 mL | INTRAVENOUS | Status: DC | PRN
  Administered 2015-04-29: 500 mL via INTRAVENOUS

## 2015-04-28 MED ORDER — FENTANYL CITRATE (PF) 100 MCG/2ML IJ SOLN
50.0000 ug | INTRAMUSCULAR | Status: DC | PRN
  Administered 2015-04-29 (×2): 100 ug via INTRAVENOUS
  Filled 2015-04-28 (×2): qty 2

## 2015-04-28 MED ORDER — CITRIC ACID-SODIUM CITRATE 334-500 MG/5ML PO SOLN: 30.0000 mL | ORAL | Status: DC | PRN

## 2015-04-28 MED ORDER — ONDANSETRON HCL 4 MG/2ML IJ SOLN
4.0000 mg | Freq: Four times a day (QID) | INTRAMUSCULAR | Status: DC | PRN
  Administered 2015-04-29 – 2015-04-30 (×4): 4 mg via INTRAVENOUS
  Filled 2015-04-28 (×4): qty 2

## 2015-04-28 MED ORDER — TERBUTALINE SULFATE 1 MG/ML IJ SOLN: 0.2500 mg | Freq: Once | INTRAMUSCULAR | Status: DC | PRN

## 2015-04-28 MED ORDER — ACETAMINOPHEN 325 MG PO TABS: 650.0000 mg | ORAL_TABLET | ORAL | Status: DC | PRN

## 2015-04-28 MED ORDER — LIDOCAINE HCL (PF) 1 % IJ SOLN
30.0000 mL | INTRAMUSCULAR | Status: DC | PRN
  Administered 2015-04-30: 30 mL via SUBCUTANEOUS

## 2015-04-28 MED ORDER — MISOPROSTOL 25 MCG QUARTER TABLET
25.0000 ug | ORAL_TABLET | ORAL | Status: DC | PRN
  Administered 2015-04-28: 25 ug via VAGINAL
  Filled 2015-04-28 (×2): qty 1
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
## 2015-04-28 NOTE — Plan of Care (Signed)
Pt here for IOL for postdates.pt's heart rate is 148 with FHR 131. Pt's mom states that her heart rate "does that" at times and she has taken her to a cardiologist but unable to find out why

## 2015-04-29 ENCOUNTER — Inpatient Hospital Stay: Payer: 59 | Admitting: Anesthesiology

## 2015-04-29 MED ORDER — BUPIVACAINE HCL (PF) 0.25 % IJ SOLN
INTRAMUSCULAR | Status: DC | PRN
  Administered 2015-04-29: 5 mL via EPIDURAL

## 2015-04-29 MED ORDER — SODIUM CHLORIDE FLUSH 0.9 % IV SOLN
INTRAVENOUS | Status: AC
Start: 2015-04-29 — End: 2015-04-30
  Filled 2015-04-29: qty 10

## 2015-04-29 MED ORDER — OXYTOCIN 10 UNIT/ML IJ SOLN
INTRAMUSCULAR | Status: AC
  Filled 2015-04-29: qty 2

## 2015-04-29 MED ORDER — LIDOCAINE HCL (PF) 1 % IJ SOLN
INTRAMUSCULAR | Status: AC
  Administered 2015-04-29: 2 mL
  Filled 2015-04-29: qty 30

## 2015-04-29 MED ORDER — AMMONIA AROMATIC IN INHA
RESPIRATORY_TRACT | Status: AC
  Filled 2015-04-29: qty 10

## 2015-04-29 MED ORDER — LIDOCAINE-EPINEPHRINE (PF) 1.5 %-1:200000 IJ SOLN
INTRAMUSCULAR | Status: DC | PRN
  Administered 2015-04-29: 2 mL via EPIDURAL

## 2015-04-29 MED ORDER — FENTANYL 2.5 MCG/ML W/ROPIVACAINE 0.2% IN NS 100 ML EPIDURAL INFUSION (ARMC-ANES)
EPIDURAL | Status: AC
  Administered 2015-04-29: 10 mL/h via EPIDURAL
  Filled 2015-04-29: qty 100

## 2015-04-29 MED ORDER — MISOPROSTOL 200 MCG PO TABS
ORAL_TABLET | ORAL | Status: AC
  Administered 2015-04-30: 200 ug via RECTAL
  Filled 2015-04-29: qty 4

## 2015-04-29 NOTE — Progress Notes (Signed)
Sarah Haney is a 20 y.o. G1P0000 at [redacted]w[redacted]d by LMP admitted for induction of labor due to Post dates.  Subjective: Denies pain with epidural inplace, resting well  Objective: BP 125/85 mmHg  Pulse 118  Temp(Src) 98.5 F (36.9 C) (Oral)  Resp 18  Ht 5\' 5"  (1.651 m)  Wt 84.369 kg (186 lb)  BMI 30.95 kg/m2  SpO2 99%  LMP 07/14/2014 (Approximate) I/O last 3 completed shifts: In: 3501.5 [I.V.:3501.5] Out: - unable to monitor contractions either with IUPC or external monitor, but able to palpate    FHT:  FHR: 125 bpm, variability: minimal ,  accelerations:  Present,  decelerations:  Absent UC:   irregular, every 2 minutes, moderate to palpation SVE:   Dilation: 5 Effacement (%): 80 Station: -1 Exam by:: Millner, RN   Labs: Lab Results  Component Value Date   WBC 10.6 04/28/2015   HGB 10.1* 04/28/2015   HCT 30.4* 04/28/2015   MCV 81.9 04/28/2015   PLT 222 04/28/2015    Assessment / Plan: Protracted active phase, second IUPC placed with adequate monitoring now, will continue to increase pitocin per protocol  Labor: slow progression due to ineffective contractions Preeclampsia:  labs stable Fetal Wellbeing:  Category II Pain Control:  Epidural I/D:  n/a Anticipated MOD:  NSVD  Sarah Haney, CNM 04/29/2015, 10:22 PM

## 2015-04-29 NOTE — Progress Notes (Signed)
Isabellamarie Gaylyn Rong is a 20 y.o. G1P0000 at [redacted]w[redacted]d by LMP admitted for induction of labor due to Post dates.  Subjective: Reports diarrhea like cramping  Objective: BP 142/84 mmHg  Pulse 118  Temp(Src) 98.2 F (36.8 C) (Oral)  SpO2 99%  LMP 07/14/2014 (Approximate) I/O last 3 completed shifts: In: 1562.5 [I.V.:1562.5] Out: -     FHT:  FHR: 135-145 bpm, variability: moderate,  accelerations:  Present,  decelerations:  Absent UC:   irregular, every 2-6 minutes, mild to palpation SVE:   Dilation: 2 Effacement (%): 80 Station: -2 Exam by:: JCM  Labs: Lab Results  Component Value Date   WBC 10.6 04/28/2015   HGB 10.1* 04/28/2015   HCT 30.4* 04/28/2015   MCV 81.9 04/28/2015   PLT 222 04/28/2015    Assessment / Plan: Induction of labor due to postterm,  progressing well on pitocin  Labor: SROM at 2am with irregular contractions since, starting pitocin now per protocol Preeclampsia:  labs stable Fetal Wellbeing:  Category I Pain Control:  Labor support without medications I/D:  n/a Anticipated MOD:  NSVD  Melody Rockney Ghee, CNM, Dr Marcelline Mates updated on patient status 04/29/2015, 8:47 AM

## 2015-04-29 NOTE — Progress Notes (Signed)
Pt. Dislikes IV location, but decline restart. IV site without s/s of infiltrate.

## 2015-04-29 NOTE — Progress Notes (Signed)
Sarah Haney is a 20 y.o. G1P0000 at [redacted]w[redacted]d by LMP admitted for induction of labor due to Post dates.  Subjective: Reports lower pelvic pain with contractions, was ambulating.  Objective: BP 114/63 mmHg  Pulse 82  Temp(Src) 98.5 F (36.9 C) (Oral)  Resp 12  Ht 5\' 5"  (1.651 m)  Wt 84.369 kg (186 lb)  BMI 30.95 kg/m2  SpO2 99%  LMP 07/14/2014 (Approximate) I/O last 3 completed shifts: In: 1562.5 [I.V.:1562.5] Out: -  Total I/O In: 1904.1 [I.V.:1904.1] Out: -   FHT:  FHR: 125 bpm, variability: moderate,  accelerations:  Present,  decelerations:  Absent UC:   regular, every 2-3 minutes, mild on palpation; IUPC placed; pitocin at 4 mu/min SVE:   Dilation: 4.5 Effacement (%): 80 Station: -1 Exam by:: Felisa Bonier CNM  Labs: Lab Results  Component Value Date   WBC 10.6 04/28/2015   HGB 10.1* 04/28/2015   HCT 30.4* 04/28/2015   MCV 81.9 04/28/2015   PLT 222 04/28/2015    Assessment / Plan: Induction of labor due to postterm,  progressing well on pitocin  Labor: Progressing normally Preeclampsia:  labs stable Fetal Wellbeing:  Category I Pain Control:  IV pain meds I/D:  n/a Anticipated MOD:  NSVD  Sarah Haney 04/29/2015, 6:22 PM

## 2015-04-29 NOTE — Anesthesia Procedure Notes (Signed)
Epidural Patient location during procedure: OB Start time: 04/29/2015 7:32 PM End time: 04/29/2015 8:05 PM  Staffing Anesthesiologist: Marline Backbone F Performed by: anesthesiologist   Preanesthetic Checklist Completed: patient identified, site marked, surgical consent, pre-op evaluation, timeout performed, IV checked, risks and benefits discussed and monitors and equipment checked  Epidural Patient position: sitting Prep: Betadine Patient monitoring: heart rate and blood pressure Approach: midline Location: L3-L4 Injection technique: LOR air and LOR saline  Needle:  Needle type: Tuohy  Needle gauge: 17 G Needle length: 9 cm Needle insertion depth: 6 cm Catheter type: closed end flexible Catheter size: 20 Guage Catheter at skin depth: 10 cm Test dose: negative and 1.5% lidocaine with Epi 1:200 K  Assessment Sensory level: T8

## 2015-04-29 NOTE — Progress Notes (Signed)
Sarah Haney is a 20 y.o. G1P0000 at [redacted]w[redacted]d by LMP admitted for induction of labor due to Post dates.  Subjective: Rates pain an 8 on pain scale, down to 4 with IV fentanyl, tearful, and frustrated over lack of cervical change,   Objective: BP 131/81 mmHg  Pulse 86  Temp(Src) 98.8 F (37.1 C) (Oral)  Resp 12  Ht 5\' 5"  (1.651 m)  Wt 84.369 kg (186 lb)  BMI 30.95 kg/m2  SpO2 99%  LMP 07/14/2014 (Approximate) I/O last 3 completed shifts: In: 1562.5 [I.V.:1562.5] Out: -  Total I/O In: 640 [I.V.:640] Out: -   FHT:  FHR: 130 bpm, variability: moderate,  accelerations:  Present,  decelerations:  Absent UC:   irregular, every 2-4 minutes, mild to palpation, on 4 mu/min of pitocin SVE:   Dilation: 1.5 Effacement (%): 80 Station: -2 Exam by:: JCM  Labs: Lab Results  Component Value Date   WBC 10.6 04/28/2015   HGB 10.1* 04/28/2015   HCT 30.4* 04/28/2015   MCV 81.9 04/28/2015   PLT 222 04/28/2015    Assessment / Plan: IOL progressing slowly, encouraged patient to ambulate, and agrees- is up now on wireless monitoring and doing well.  Labor: Progressing normally Preeclampsia:  labs stable Fetal Wellbeing:  Category I Pain Control:  IV pain meds I/D:  n/a Anticipated MOD:  NSVD  Saleha Kalp N Brieonna Crutcher, CNM 04/29/2015, 1:30 PM

## 2015-04-30 ENCOUNTER — Encounter: Payer: Self-pay | Admitting: Student

## 2015-04-30 DIAGNOSIS — Z3403 Encounter for supervision of normal first pregnancy, third trimester: Secondary | ICD-10-CM

## 2015-04-30 LAB — RPR: RPR Ser Ql: NONREACTIVE

## 2015-04-30 MED ORDER — BENZOCAINE-MENTHOL 20-0.5 % EX AERO: 1.0000 "application " | INHALATION_SPRAY | CUTANEOUS | Status: DC | PRN

## 2015-04-30 MED ORDER — IBUPROFEN 600 MG PO TABS
ORAL_TABLET | ORAL | Status: AC
  Administered 2015-04-30: 600 mg via ORAL
  Filled 2015-04-30: qty 1

## 2015-04-30 MED ORDER — ONDANSETRON HCL 4 MG/2ML IJ SOLN
4.0000 mg | INTRAMUSCULAR | Status: DC | PRN
Start: 2015-04-30 — End: 2015-04-30

## 2015-04-30 MED ORDER — MISOPROSTOL 200 MCG PO TABS
200.0000 ug | ORAL_TABLET | Freq: Once | ORAL | Status: AC
  Administered 2015-04-30: 200 ug via RECTAL

## 2015-04-30 MED ORDER — MEASLES, MUMPS & RUBELLA VAC ~~LOC~~ INJ
0.5000 mL | INJECTION | Freq: Once | SUBCUTANEOUS | Status: DC
  Filled 2015-04-30: qty 0.5

## 2015-04-30 MED ORDER — MISOPROSTOL 200 MCG PO TABS: 2000.0000 ug | ORAL_TABLET | Freq: Once | ORAL | Status: DC

## 2015-04-30 MED ORDER — PHENYLEPHRINE 40 MCG/ML (10ML) SYRINGE FOR IV PUSH (FOR BLOOD PRESSURE SUPPORT)
80.0000 ug | PREFILLED_SYRINGE | INTRAVENOUS | Status: DC | PRN
  Filled 2015-04-30: qty 2

## 2015-04-30 MED ORDER — LANOLIN HYDROUS EX OINT: TOPICAL_OINTMENT | CUTANEOUS | Status: DC | PRN

## 2015-04-30 MED ORDER — VARICELLA VIRUS VACCINE LIVE 1350 PFU/0.5ML IJ SUSR
0.5000 mL | Freq: Once | INTRAMUSCULAR | Status: DC
  Filled 2015-04-30: qty 0.5

## 2015-04-30 MED ORDER — FENTANYL 2.5 MCG/ML W/ROPIVACAINE 0.2% IN NS 100 ML EPIDURAL INFUSION (ARMC-ANES)
10.0000 mL/h | EPIDURAL | Status: DC
  Administered 2015-04-29: 10 mL/h via EPIDURAL
  Administered 2015-04-30: 250 ug via EPIDURAL

## 2015-04-30 MED ORDER — OXYCODONE-ACETAMINOPHEN 5-325 MG PO TABS
2.0000 | ORAL_TABLET | ORAL | Status: DC | PRN
  Administered 2015-05-01 (×2): 2 via ORAL
  Filled 2015-04-30 (×2): qty 2

## 2015-04-30 MED ORDER — PRENATAL MULTIVITAMIN CH
1.0000 | ORAL_TABLET | Freq: Every day | ORAL | Status: DC
  Administered 2015-05-01: 1 via ORAL
  Filled 2015-04-30: qty 1

## 2015-04-30 MED ORDER — ACETAMINOPHEN 325 MG PO TABS
650.0000 mg | ORAL_TABLET | ORAL | Status: DC | PRN
Start: 2015-04-30 — End: 2015-05-02

## 2015-04-30 MED ORDER — DIPHENHYDRAMINE HCL 25 MG PO CAPS: 25.0000 mg | ORAL_CAPSULE | Freq: Four times a day (QID) | ORAL | Status: DC | PRN

## 2015-04-30 MED ORDER — LACTATED RINGERS IV SOLN: 500.0000 mL | Freq: Once | INTRAVENOUS | Status: DC

## 2015-04-30 MED ORDER — WITCH HAZEL-GLYCERIN EX PADS
1.0000 | MEDICATED_PAD | CUTANEOUS | Status: DC | PRN
Start: 2015-04-30 — End: 2015-05-02

## 2015-04-30 MED ORDER — OXYCODONE-ACETAMINOPHEN 5-325 MG PO TABS
1.0000 | ORAL_TABLET | ORAL | Status: DC | PRN
  Administered 2015-04-30 – 2015-05-01 (×3): 1 via ORAL
  Filled 2015-04-30 (×3): qty 1

## 2015-04-30 MED ORDER — FENTANYL 2.5 MCG/ML W/ROPIVACAINE 0.2% IN NS 100 ML EPIDURAL INFUSION (ARMC-ANES)
EPIDURAL | Status: AC
  Administered 2015-04-30: 250 ug via EPIDURAL
  Filled 2015-04-30: qty 100

## 2015-04-30 MED ORDER — DOCUSATE SODIUM 100 MG PO CAPS
100.0000 mg | ORAL_CAPSULE | Freq: Two times a day (BID) | ORAL | Status: DC
  Administered 2015-04-30 – 2015-05-01 (×3): 100 mg via ORAL
  Filled 2015-04-30 (×3): qty 1

## 2015-04-30 MED ORDER — DIBUCAINE 1 % RE OINT: 1.0000 "application " | TOPICAL_OINTMENT | RECTAL | Status: DC | PRN

## 2015-04-30 MED ORDER — EPHEDRINE 5 MG/ML INJ
10.0000 mg | INTRAVENOUS | Status: DC | PRN
Start: 2015-04-30 — End: 2015-04-30
  Filled 2015-04-30: qty 2

## 2015-04-30 MED ORDER — DIPHENHYDRAMINE HCL 50 MG/ML IJ SOLN: 12.5000 mg | INTRAMUSCULAR | Status: DC | PRN

## 2015-04-30 MED ORDER — SIMETHICONE 80 MG PO CHEW: 80.0000 mg | CHEWABLE_TABLET | ORAL | Status: DC | PRN

## 2015-04-30 MED ORDER — PHENYLEPHRINE 40 MCG/ML (10ML) SYRINGE FOR IV PUSH (FOR BLOOD PRESSURE SUPPORT)
80.0000 ug | PREFILLED_SYRINGE | INTRAVENOUS | Status: DC | PRN
Start: 2015-04-30 — End: 2015-04-30
  Filled 2015-04-30: qty 2

## 2015-04-30 MED ORDER — EPHEDRINE 5 MG/ML INJ
10.0000 mg | INTRAVENOUS | Status: DC | PRN
  Filled 2015-04-30: qty 2

## 2015-04-30 MED ORDER — IBUPROFEN 600 MG PO TABS
600.0000 mg | ORAL_TABLET | Freq: Four times a day (QID) | ORAL | Status: DC
  Administered 2015-04-30 – 2015-05-02 (×9): 600 mg via ORAL
  Filled 2015-04-30 (×8): qty 1

## 2015-04-30 MED ORDER — ONDANSETRON HCL 4 MG PO TABS: 4.0000 mg | ORAL_TABLET | ORAL | Status: DC | PRN

## 2015-04-30 NOTE — Progress Notes (Addendum)
Pt.'s pain 7/10, level at T9, Dr. Janeann Forehand notified of patient's pain. Order received to increase epidural pump to 20 ml for one hour, after one hour decrease back to 10 ml/hr. RN will continue to monitor. FHR 125 bpm, contractions q85min, each contraction lasting 60-70 sec.,  with IUPC in place (MVU 65-135).

## 2015-04-30 NOTE — Progress Notes (Signed)
Sarah Haney is a 20 y.o. G1P0000 at [redacted]w[redacted]d by LMP admitted for induction of labor due to Post dates.  Subjective: Feeling light pressure , otherwise denies pain, epidural in place  Objective: BP 124/72 mmHg  Pulse 104  Temp(Src) 98.7 F (37.1 C) (Oral)  Resp 18  Ht 5\' 5"  (1.651 m)  Wt 186 lb (84.369 kg)  BMI 30.95 kg/m2  SpO2 99%  LMP 07/14/2014 (Approximate) I/O last 3 completed shifts: In: 5447.9 [I.V.:5338.1; Other:109.8] Out: 1425 [Urine:1425]    Fetal Wellbeing:  Category II UC:   regular, every 2 minutes SVE:   9.5/95/+1 Labs: Lab Results  Component Value Date   WBC 10.6 04/28/2015   HGB 10.1* 04/28/2015   HCT 30.4* 04/28/2015   MCV 81.9 04/28/2015   PLT 222 04/28/2015    Assessment / Plan: progressing well  Labor: Progressing normally Preeclampsia:  labs stable Pain Control:  Epidural Anticipated MOD:  NSVD  Elexius Minar N Donicia Druck, CNM 04/30/2015, 8:13 AM

## 2015-04-30 NOTE — Progress Notes (Signed)
IUPC will not monitor when peanut ball removed.

## 2015-04-30 NOTE — Lactation Note (Signed)
This note was copied from a baby's chart. Lactation Consultation Note  Patient Name: Sarah Haney [id number] Date: 04/30/2015 Reason for consult: Initial assessment   Maternal Data Formula Feeding for Exclusion: No Has patient been taught Hand Expression?: Yes Does the patient have breastfeeding experience prior to this delivery?: No  Feeding Feeding Type: Breast Fed  LATCH Score/Interventions Latch: Grasps breast easily, tongue down, lips flanged, rhythmical sucking.  Intervention(s): Skin to skin  Type of Nipple: Everted at rest and after stimulation  Comfort (Breast/Nipple): Soft / non-tender     Hold (Positioning): No assistance needed to correctly position infant at breast.     Lactation Tools Discussed/Used     Consult Status      Daryel November 04/30/2015, 4:39 PM

## 2015-04-30 NOTE — Lactation Note (Signed)
This note was copied from a baby's chart. Lactation Consultation Note  Patient Name: Girl Susana Regier [id number] Date: 04/30/2015 Reason for consult: Initial assessment   Maternal Data Formula Feeding for Exclusion: No Has patient been taught Hand Expression?: Yes Does the patient have breastfeeding experience prior to this delivery?: No   Feeding Feeding Type: Breast Fed  LATCH Score/Interventions Latch: Grasps breast easily, tongue down, lips flanged, rhythmical sucking.  Intervention(s): Skin to skin  Type of Nipple: Everted at rest and after stimulation  Comfort (Breast/Nipple): Soft / non-tender     Hold (Positioning): No assistance needed to correctly position infant at breast.       Mother is pumping.   Consult Status  Ongoing  Daryel November 04/30/2015, 4:34 PM

## 2015-05-01 LAB — CBC
HEMATOCRIT: 23.3 % — AB (ref 35.0–47.0)
HEMATOCRIT: 31 % — AB (ref 35.0–47.0)
HEMOGLOBIN: 7.5 g/dL — AB (ref 12.0–16.0)
Hemoglobin: 10.3 g/dL — ABNORMAL LOW (ref 12.0–16.0)
MCH: 26.2 pg (ref 26.0–34.0)
MCH: 27 pg (ref 26.0–34.0)
MCHC: 32.2 g/dL (ref 32.0–36.0)
MCHC: 33.2 g/dL (ref 32.0–36.0)
MCV: 81.4 fL (ref 80.0–100.0)
MCV: 81.5 fL (ref 80.0–100.0)
Platelets: 191 10*3/uL (ref 150–440)
Platelets: 200 10*3/uL (ref 150–440)
RBC: 2.86 MIL/uL — AB (ref 3.80–5.20)
RBC: 3.8 MIL/uL (ref 3.80–5.20)
RDW: 15.6 % — AB (ref 11.5–14.5)
RDW: 15.7 % — ABNORMAL HIGH (ref 11.5–14.5)
WBC: 13.3 10*3/uL — AB (ref 3.6–11.0)
WBC: 13.1 10*3/uL — AB (ref 3.6–11.0)

## 2015-05-01 LAB — PREPARE RBC (CROSSMATCH)

## 2015-05-01 MED ORDER — SODIUM CHLORIDE 0.9 % IV SOLN
Freq: Once | INTRAVENOUS | Status: AC
  Administered 2015-05-01: 09:00:00 via INTRAVENOUS

## 2015-05-01 MED ORDER — ACETAMINOPHEN 325 MG PO TABS
650.0000 mg | ORAL_TABLET | Freq: Once | ORAL | Status: AC
Start: 2015-05-01 — End: 2015-05-01
  Administered 2015-05-01: 650 mg via ORAL
  Filled 2015-05-01: qty 2

## 2015-05-01 MED ORDER — DIPHENHYDRAMINE HCL 25 MG PO CAPS
25.0000 mg | ORAL_CAPSULE | Freq: Once | ORAL | Status: AC
  Administered 2015-05-01: 25 mg via ORAL
  Filled 2015-05-01: qty 1

## 2015-05-01 NOTE — Anesthesia Preprocedure Evaluation (Signed)
Anesthesia Evaluation  Patient identified by MRN, date of birth, ID band Patient awake    Reviewed: Allergy & Precautions  History of Anesthesia Complications Negative for: history of anesthetic complications  Airway Mallampati: II       Dental  (+) Teeth Intact   Pulmonary neg pulmonary ROS,    breath sounds clear to auscultation       Cardiovascular Exercise Tolerance: Good  Rhythm:Regular     Neuro/Psych    GI/Hepatic negative GI ROS, Neg liver ROS,   Endo/Other  negative endocrine ROS  Renal/GU negative Renal ROS     Musculoskeletal negative musculoskeletal ROS (+)   Abdominal Normal abdominal exam  (+)   Peds  Hematology negative hematology ROS (+)   Anesthesia Other Findings   Reproductive/Obstetrics negative OB ROS                             Anesthesia Physical Anesthesia Plan  ASA: II  Anesthesia Plan: Epidural   Post-op Pain Management:    Induction:   Airway Management Planned: Natural Airway  Additional Equipment:   Intra-op Plan:   Post-operative Plan:   Informed Consent: I have reviewed the patients History and Physical, chart, labs and discussed the procedure including the risks, benefits and alternatives for the proposed anesthesia with the patient or authorized representative who has indicated his/her understanding and acceptance.     Plan Discussed with:   Anesthesia Plan Comments:         Anesthesia Quick Evaluation

## 2015-05-01 NOTE — Anesthesia Postprocedure Evaluation (Signed)
Anesthesia Post Note  Patient: Sarah Haney  Procedure(s) Performed: * No procedures listed *  Patient location during evaluation: Mother Baby Anesthesia Type: Epidural Level of consciousness: awake and alert Pain management: pain level controlled Vital Signs Assessment: post-procedure vital signs reviewed and stable Respiratory status: spontaneous breathing Cardiovascular status: stable Postop Assessment: no headache and patient able to bend at knees (c/o localized backache) Anesthetic complications: no    Last Vitals:  Filed Vitals:   05/01/15 0009 05/01/15 0406  BP: 104/79 112/59  Pulse: 90 93  Temp: 36.6 C 36.7 C  Resp: 18 20    Last Pain:  Filed Vitals:   05/01/15 0656  PainSc: 6                  Rolla Plate P

## 2015-05-01 NOTE — Progress Notes (Signed)
Post Partum Day 1 Subjective: up ad lib, voiding and some dizziness when up  Objective: Blood pressure 112/59, pulse 93, temperature 98.1 F (36.7 C), temperature source Oral, resp. rate 20, height 5\' 5"  (1.651 m), weight 186 lb (84.369 kg), last menstrual period 07/14/2014, SpO2 99 %, unknown if currently breastfeeding.  Physical Exam:  General: alert, cooperative, appears stated age, fatigued and pale Lochia: appropriate Uterine Fundus: firm Incision: NA DVT Evaluation: No evidence of DVT seen on physical exam. Negative Homan's sign.   Recent Labs  04/28/15 1855 05/01/15 0554  HGB 10.1* 7.5*  HCT 30.4* 23.3*    Assessment/Plan: Anemic secondary to [place] yesterday- will transfuse 2 units PRBC Plan for discharge tomorrow Infant feeding formula   LOS: 3 days   Melody N Shambley,CNM 05/01/2015, 8:24 AM

## 2015-05-02 MED ORDER — ONDANSETRON 4 MG PO TBDP: 4.0000 mg | ORAL_TABLET | Freq: Three times a day (TID) | ORAL | Status: DC | PRN

## 2015-05-02 MED ORDER — IBUPROFEN 600 MG PO TABS: 600.0000 mg | ORAL_TABLET | Freq: Four times a day (QID) | ORAL | Status: DC

## 2015-05-02 MED ORDER — DOCUSATE SODIUM 100 MG PO CAPS: 100.0000 mg | ORAL_CAPSULE | Freq: Two times a day (BID) | ORAL | Status: DC

## 2015-05-02 MED ORDER — ONDANSETRON 4 MG PO TBDP
4.0000 mg | ORAL_TABLET | Freq: Three times a day (TID) | ORAL | Status: DC | PRN
  Administered 2015-05-02: 4 mg via ORAL
  Filled 2015-05-02: qty 1

## 2015-05-02 MED ORDER — OXYCODONE-ACETAMINOPHEN 5-325 MG PO TABS: 2.0000 | ORAL_TABLET | ORAL | Status: DC | PRN

## 2015-05-02 NOTE — Progress Notes (Signed)
Prenatal records indicate that pt received TDaP and Influenza vaccines during pregnancy.  Pt educated on need for MMR and rationale for taking this vaccine.  Pt declines MMR vaccine at this time. Reed Breech, RN 05/02/2015 2:02 PM

## 2015-05-02 NOTE — Progress Notes (Signed)
Discharge instructions provided.  Pt and pt;s mother verbalize understanding of all instructions and follow-up care.  Prescriptions given.  Pt discharged to home with infant at 1545 on 05/02/15 via wheelchair by CNA. Reed Breech, RN 05/02/2015 4:18 PM

## 2015-05-02 NOTE — Discharge Summary (Signed)
Obstetric Discharge Summary Reason for Admission: induction of labor and Postdates Prenatal Procedures: NST and ultrasound Intrapartum Procedures: spontaneous vaginal delivery Postpartum Procedures: transfusion 2 units PRBC's Complications-Operative and Postpartum: vaginal laceration and [place] with PPH HEMOGLOBIN  Date Value Ref Range Status  05/01/2015 10.3* 12.0 - 16.0 g/dL Final    Comment:    RESULT REPEATED AND VERIFIED   HGB  Date Value Ref Range Status  11/23/2012 14.2 12.0-16.0 g/dL Final   HCT  Date Value Ref Range Status  05/01/2015 31.0* 35.0 - 47.0 % Final  11/23/2012 41.2 35.0-47.0 % Final   HEMATOCRIT  Date Value Ref Range Status  01/28/2015 32.3* 34.0 - 46.6 % Final   CBC Latest Ref Rng 05/01/2015 05/01/2015 04/28/2015  WBC 3.6 - 11.0 K/uL 13.1(H) 13.3(H) 10.6  Hemoglobin 12.0 - 16.0 g/dL 10.3(L) 7.5(L) 10.1(L)  Hematocrit 35.0 - 47.0 % 31.0(L) 23.3(L) 30.4(L)  Platelets 150 - 440 K/uL 200 191 222     Physical Exam:  General: alert and cooperative Lochia: appropriate Uterine Fundus: firm Incision: NA DVT Evaluation: No evidence of DVT seen on physical exam.  Discharge Diagnoses: 1. Term Pregnancy-delivered  2. Nausea and diarrhea, likely Acute Viral GE 3. PPH, S/P Transfusion 2 units PRBC'S  Discharge Information: Date: 05/02/2015 Activity: pelvic rest Diet: routine and BRAT diet while Nausea and diarrhea are ongoing Medications: PNV, Ibuprofen, Colace, Percocet and Zofran Condition: stable Instructions: refer to practice specific booklet Discharge to: home Follow-up Information    Follow up with Melody Rockney Ghee, CNM. Schedule an appointment as soon as possible for a visit in 6 weeks.   Specialties:  Obstetrics and Gynecology, Radiology   Why:  6 week Post Partum Check   Contact information:   [place] [id number] [phone]       Newborn Data: Live born female  Birth Weight: 8 lb 7.5 oz (3840 g) APGAR: 7,  9  Home with mother and bottlefeeding.  Hassell Done A Earlene Bjelland 05/02/2015, 11:14 AM

## 2015-05-02 NOTE — Discharge Instructions (Signed)
Vaginal Delivery, Care After Refer to this sheet in the next few weeks. These discharge instructions provide you with information on caring for yourself after delivery. Your health care provider may also give you specific instructions. Your treatment has been planned according to the most current medical practices available, but problems sometimes occur. Call your health care provider if you have any problems or questions after you go home. HOME CARE INSTRUCTIONS  Take over-the-counter or prescription medicines only as directed by your health care provider or pharmacist.  Do not drink alcohol, especially if you are breastfeeding or taking medicine to relieve pain.  Do not chew or smoke tobacco.  Do not use illegal drugs.  Continue to use good perineal care. Good perineal care includes:  Wiping your perineum from front to back.  Keeping your perineum clean.  Do not use tampons for the next 6 weeks   Showers only, no tub baths for the next six weeks.   Wear a well-fitting bra that provides breast support.  Eat healthy foods.  Drink enough fluids to keep your urine clear or pale yellow.  Eat high-fiber foods such as whole grain cereals and breads, brown rice, beans, and fresh fruits and vegetables every day. These foods may help prevent or relieve constipation.  No heavy lifting or strenuous activity for the next 2 weeks.   No sexual intercourse for at least the next 6 weeks, and then not until you are ready.   Try to have someone help you with your household activities and your newborn for at least a few days after you leave the hospital.  Rest as much as possible. Try to rest or take a nap when your newborn is sleeping.  Increase your activities gradually.  Keep all of your scheduled postpartum appointments. It is very important to keep your scheduled follow-up appointments. At these appointments, your health care provider will be checking to make sure that you are healing  physically and emotionally. SEEK MEDICAL CARE IF:   You are passing large clots from your vagina. Save any clots to show your health care provider.  You have a foul smelling discharge from your vagina.  You have trouble urinating.  You are urinating frequently.  You have pain when you urinate.  You have a change in your bowel movements.  You have increasing redness, pain, or swelling near your vaginal incision (episiotomy) or vaginal tear.  You have pus draining from your episiotomy or vaginal tear.  Your episiotomy or vaginal tear is separating.  You have painful, hard, or reddened breasts.  You have a severe headache.  You have blurred vision or see spots.  You feel sad or depressed.  You have thoughts of hurting yourself or your newborn.  You have questions about your care, the care of your newborn, or medicines.  You are dizzy or light-headed.  You have a rash.  You have nausea or vomiting.  You were breastfeeding and have not had a menstrual period within 12 weeks after you stopped breastfeeding.  You are not breastfeeding and have not had a menstrual period by the 12th week after delivery.  You have a fever. SEEK IMMEDIATE MEDICAL CARE IF:   You have persistent pain.  You have chest pain.  You have shortness of breath.  You faint.  You have leg pain.  You have stomach pain.  Your vaginal bleeding saturates two or more sanitary pads in 1 hour.   This information is not intended to replace advice given  to you by your health care provider. Make sure you discuss any questions you have with your health care provider.   Document Released: 02/05/2000 Document Revised: 10/29/2014 Document Reviewed: 10/05/2011 Elsevier Interactive Patient Education 2016 [employer].  Call your doctor for increased pain or vaginal bleeding, temperature above 100.4, depression, or concerns.  No strenuous activity or heavy lifting for 6 weeks.  No intercourse, tampons,  douching, or enemas for 6 weeks.  No tub baths-showers only.  No driving for 2 weeks or while taking pain medications.  Continue prenatal vitamin and iron.

## 2015-05-03 LAB — TYPE AND SCREEN
ABO/RH(D): O NEG
ANTIBODY SCREEN: NEGATIVE
UNIT DIVISION: 0
Unit division: 0

## 2015-06-02 ENCOUNTER — Ambulatory Visit: Payer: 59 | Admitting: Obstetrics and Gynecology

## 2015-06-02 ENCOUNTER — Encounter: Payer: Self-pay | Admitting: Obstetrics and Gynecology

## 2015-06-02 VITALS — BP 117/84 | HR 81 | Wt 166.0 lb

## 2015-06-02 DIAGNOSIS — R519 Headache, unspecified: Secondary | ICD-10-CM | POA: Insufficient documentation

## 2015-06-02 DIAGNOSIS — R51 Headache: Principal | ICD-10-CM

## 2015-06-02 MED ORDER — LEVONORGEST-ETH ESTRAD 91-DAY 0.1-0.02 & 0.01 MG PO TABS: 1.0000 | ORAL_TABLET | Freq: Every day | ORAL | Status: DC

## 2015-06-02 MED ORDER — IBUPROFEN 600 MG PO TABS: 600.0000 mg | ORAL_TABLET | Freq: Four times a day (QID) | ORAL | Status: DC

## 2015-06-02 NOTE — Patient Instructions (Signed)
  Place postpartum visit patient instructions here.  

## 2015-06-02 NOTE — Progress Notes (Unsigned)
  Subjective:     Sarah Haney is a 20 y.o. female who presents for a postpartum visit. She is {1-10:13787} {time; units:18646} postpartum following a {delivery:12449}. I have fully reviewed the prenatal and intrapartum course. The delivery was at *** gestational weeks. Outcome: {delivery outcome:32078}. Anesthesia: {anesthesia types:812}. Postpartum course has been ***. Baby's course has been ***. Baby is feeding by {breast/bottle:69}. Bleeding {vag bleed:12292}. Bowel function is {normal:32111}. Bladder function is {normal:32111}. Patient {is/is not:9024} sexually active. Contraception method is {contraceptive method:5051}. Postpartum depression screening: {neg default:13464::"negative"}.  {Common ambulatory SmartLinks:19316}  Review of Systems {ros; complete:30496}   Objective:    BP 117/84 mmHg  Pulse 81  Wt 166 lb (75.297 kg)  Breastfeeding? No  General:  {gen appearance:16600}   Breasts:  {breast exam:1202::"inspection negative, no nipple discharge or bleeding, no masses or nodularity palpable"}  Lungs: {lung exam:16931}  Heart:  {heart exam:5510}  Abdomen: {abdomen exam:16834}   Vulva:  {labia exam:12198}  Vagina: {vagina exam:12200}  Cervix:  {cervix exam:14595}  Corpus: {uterus exam:12215}  Adnexa:  {adnexa exam:12223}  Rectal Exam: {rectal/vaginal exam:12274}        Assessment:    *** postpartum exam. Pap smear {done:10129} at today's visit.   Plan:    1. Contraception: {method:5051} 2. *** 3. Follow up in: {1-10:13787} {time; units:19136} or as needed.

## 2015-06-03 ENCOUNTER — Other Ambulatory Visit: Payer: Self-pay | Admitting: Obstetrics and Gynecology

## 2015-06-03 DIAGNOSIS — E559 Vitamin D deficiency, unspecified: Secondary | ICD-10-CM | POA: Insufficient documentation

## 2015-06-03 LAB — COMPREHENSIVE METABOLIC PANEL
ALBUMIN: 4.6 g/dL (ref 3.5–5.5)
ALK PHOS: 123 IU/L — AB (ref 39–117)
ALT: 59 IU/L — ABNORMAL HIGH (ref 0–32)
AST: 37 IU/L (ref 0–40)
Albumin/Globulin Ratio: 1.8 (ref 1.2–2.2)
BUN / CREAT RATIO: 14 (ref 9–23)
BUN: 11 mg/dL (ref 6–20)
Bilirubin Total: 0.3 mg/dL (ref 0.0–1.2)
CALCIUM: 9.4 mg/dL (ref 8.7–10.2)
CO2: 23 mmol/L (ref 18–29)
CREATININE: 0.77 mg/dL (ref 0.57–1.00)
Chloride: 102 mmol/L (ref 96–106)
GFR calc Af Amer: 129 mL/min/{1.73_m2} (ref >59)
GFR, EST NON AFRICAN AMERICAN: 112 mL/min/{1.73_m2} (ref >59)
GLOBULIN, TOTAL: 2.5 g/dL (ref 1.5–4.5)
Glucose: 86 mg/dL (ref 65–99)
POTASSIUM: 4.2 mmol/L (ref 3.5–5.2)
SODIUM: 143 mmol/L (ref 134–144)
Total Protein: 7.1 g/dL (ref 6.0–8.5)

## 2015-06-03 LAB — CBC
HEMATOCRIT: 43.2 % (ref 34.0–46.6)
HEMOGLOBIN: 13.8 g/dL (ref 11.1–15.9)
MCH: 27.4 pg (ref 26.6–33.0)
MCHC: 31.9 g/dL (ref 31.5–35.7)
MCV: 86 fL (ref 79–97)
Platelets: 330 10*3/uL (ref 150–379)
RBC: 5.04 x10E6/uL (ref 3.77–5.28)
RDW: 18.2 % — AB (ref 12.3–15.4)
WBC: 5.9 10*3/uL (ref 3.4–10.8)

## 2015-06-03 LAB — VITAMIN D 25 HYDROXY (VIT D DEFICIENCY, FRACTURES): Vit D, 25-Hydroxy: 17.7 ng/mL — ABNORMAL LOW (ref 30.0–100.0)

## 2015-06-03 MED ORDER — VITAMIN D (ERGOCALCIFEROL) 1.25 MG (50000 UNIT) PO CAPS: 50000.0000 [IU] | ORAL_CAPSULE | ORAL | Status: DC

## 2015-06-08 ENCOUNTER — Telehealth: Payer: Self-pay | Admitting: *Deleted

## 2015-06-08 NOTE — Telephone Encounter (Signed)
Mailed all info to pt 

## 2015-06-08 NOTE — Telephone Encounter (Signed)
-----   Message from Joylene Igo, [place] sent at 06/03/2015  5:43 PM EDT ----- Please mail info on vit D def

## 2015-06-11 ENCOUNTER — Ambulatory Visit: Payer: 59 | Admitting: Obstetrics and Gynecology

## 2015-06-22 ENCOUNTER — Telehealth: Payer: Self-pay | Admitting: *Deleted

## 2015-06-22 NOTE — Telephone Encounter (Signed)
Patient called and stated that the nurse called her. She was returning the missed call. Patient is requesting a call back. Call back number [phone]

## 2015-06-23 NOTE — Telephone Encounter (Signed)
Spoke with pt will fix paperwork

## 2015-07-02 ENCOUNTER — Encounter: Payer: Self-pay | Admitting: Obstetrics and Gynecology

## 2015-10-02 ENCOUNTER — Encounter: Payer: Self-pay | Admitting: Obstetrics and Gynecology

## 2015-10-02 ENCOUNTER — Ambulatory Visit (INDEPENDENT_AMBULATORY_CARE_PROVIDER_SITE_OTHER): Payer: 59 | Admitting: Obstetrics and Gynecology

## 2015-10-02 VITALS — BP 89/67 | HR 74 | Ht 65.0 in | Wt 170.0 lb

## 2015-10-02 DIAGNOSIS — A599 Trichomoniasis, unspecified: Secondary | ICD-10-CM

## 2015-10-02 DIAGNOSIS — Z01419 Encounter for gynecological examination (general) (routine) without abnormal findings: Secondary | ICD-10-CM

## 2015-10-02 DIAGNOSIS — E559 Vitamin D deficiency, unspecified: Secondary | ICD-10-CM

## 2015-10-02 MED ORDER — TINIDAZOLE 500 MG PO TABS: 2.0000 g | ORAL_TABLET | Freq: Once | ORAL | 1 refills | Status: AC

## 2015-10-02 NOTE — Patient Instructions (Addendum)
Preventive Care for Adults, Female A healthy lifestyle and preventive care can promote health and wellness. Preventive health guidelines for women include the following key practices.  A routine yearly physical is a good way to check with your health care provider about your health and preventive screening. It is a chance to share any concerns and updates on your health and to receive a thorough exam.  Visit your dentist for a routine exam and preventive care every 6 months. Brush your teeth twice a day and floss once a day. Good oral hygiene prevents tooth decay and gum disease.  The frequency of eye exams is based on your age, health, family medical history, use of contact lenses, and other factors. Follow your health care provider's recommendations for frequency of eye exams.  Eat a healthy diet. Foods like vegetables, fruits, whole grains, low-fat dairy products, and lean protein foods contain the nutrients you need without too many calories. Decrease your intake of foods high in solid fats, added sugars, and salt. Eat the right amount of calories for you.Get information about a proper diet from your health care provider, if necessary.  Regular physical exercise is one of the most important things you can do for your health. Most adults should get at least 150 minutes of moderate-intensity exercise (any activity that increases your heart rate and causes you to sweat) each week. In addition, most adults need muscle-strengthening exercises on 2 or more days a week.  Maintain a healthy weight. The body mass index (BMI) is a screening tool to identify possible weight problems. It provides an estimate of body fat based on height and weight. Your health care provider can find your BMI and can help you achieve or maintain a healthy weight.For adults 20 years and older:  A BMI below 18.5 is considered underweight.  A BMI of 18.5 to 24.9 is normal.  A BMI of 25 to 29.9 is considered  overweight.  A BMI of 30 and above is considered obese.  Maintain normal blood lipids and cholesterol levels by exercising and minimizing your intake of saturated fat. Eat a balanced diet with plenty of fruit and vegetables. Blood tests for lipids and cholesterol should begin at age 64 and be repeated every 5 years. If your lipid or cholesterol levels are high, you are over 50, or you are at high risk for heart disease, you may need your cholesterol levels checked more frequently.Ongoing high lipid and cholesterol levels should be treated with medicines if diet and exercise are not working.  If you smoke, find out from your health care provider how to quit. If you do not use tobacco, do not start.  Lung cancer screening is recommended for adults aged 52-80 years who are at high risk for developing lung cancer because of a history of smoking. A yearly low-dose CT scan of the lungs is recommended for people who have at least a 30-pack-year history of smoking and are a current smoker or have quit within the past 15 years. A pack year of smoking is smoking an average of 1 pack of cigarettes a day for 1 year (for example: 1 pack a day for 30 years or 2 packs a day for 15 years). Yearly screening should continue until the smoker has stopped smoking for at least 15 years. Yearly screening should be stopped for people who develop a health problem that would prevent them from having lung cancer treatment.  If you are pregnant, do not drink alcohol. If you are  breastfeeding, be very cautious about drinking alcohol. If you are not pregnant and choose to drink alcohol, do not have more than 1 drink per day. One drink is considered to be 12 ounces (355 mL) of beer, 5 ounces (148 mL) of wine, or 1.5 ounces (44 mL) of liquor.  Avoid use of street drugs. Do not share needles with anyone. Ask for help if you need support or instructions about stopping the use of drugs.  High blood pressure causes heart disease and  increases the risk of stroke. Your blood pressure should be checked at least every 1 to 2 years. Ongoing high blood pressure should be treated with medicines if weight loss and exercise do not work.  If you are 25-78 years old, ask your health care provider if you should take aspirin to prevent strokes.  Diabetes screening is done by taking a blood sample to check your blood glucose level after you have not eaten for a certain period of time (fasting). If you are not overweight and you do not have risk factors for diabetes, you should be screened once every 3 years starting at age 86. If you are overweight or obese and you are 3-87 years of age, you should be screened for diabetes every year as part of your cardiovascular risk assessment.  Breast cancer screening is essential preventive care for women. You should practice "breast self-awareness." This means understanding the normal appearance and feel of your breasts and may include breast self-examination. Any changes detected, no matter how small, should be reported to a health care provider. Women in their 66s and 30s should have a clinical breast exam (CBE) by a health care provider as part of a regular health exam every 1 to 3 years. After age 43, women should have a CBE every year. Starting at age 37, women should consider having a mammogram (breast X-ray test) every year. Women who have a family history of breast cancer should talk to their health care provider about genetic screening. Women at a high risk of breast cancer should talk to their health care providers about having an MRI and a mammogram every year.  Breast cancer gene (BRCA)-related cancer risk assessment is recommended for women who have family members with BRCA-related cancers. BRCA-related cancers include breast, ovarian, tubal, and peritoneal cancers. Having family members with these cancers may be associated with an increased risk for harmful changes (mutations) in the breast  cancer genes BRCA1 and BRCA2. Results of the assessment will determine the need for genetic counseling and BRCA1 and BRCA2 testing.  Your health care provider may recommend that you be screened regularly for cancer of the pelvic organs (ovaries, uterus, and vagina). This screening involves a pelvic examination, including checking for microscopic changes to the surface of your cervix (Pap test). You may be encouraged to have this screening done every 3 years, beginning at age 78.  For women ages 79-65, health care providers may recommend pelvic exams and Pap testing every 3 years, or they may recommend the Pap and pelvic exam, combined with testing for human papilloma virus (HPV), every 5 years. Some types of HPV increase your risk of cervical cancer. Testing for HPV may also be done on women of any age with unclear Pap test results.  Other health care providers may not recommend any screening for nonpregnant women who are considered low risk for pelvic cancer and who do not have symptoms. Ask your health care provider if a screening pelvic exam is right for  you.  If you have had past treatment for cervical cancer or a condition that could lead to cancer, you need Pap tests and screening for cancer for at least 20 years after your treatment. If Pap tests have been discontinued, your risk factors (such as having a new sexual partner) need to be reassessed to determine if screening should resume. Some women have medical problems that increase the chance of getting cervical cancer. In these cases, your health care provider may recommend more frequent screening and Pap tests.  Colorectal cancer can be detected and often prevented. Most routine colorectal cancer screening begins at the age of 50 years and continues through age 75 years. However, your health care provider may recommend screening at an earlier age if you have risk factors for colon cancer. On a yearly basis, your health care provider may provide  home test kits to check for hidden blood in the stool. Use of a small camera at the end of a tube, to directly examine the colon (sigmoidoscopy or colonoscopy), can detect the earliest forms of colorectal cancer. Talk to your health care provider about this at age 50, when routine screening begins. Direct exam of the colon should be repeated every 5-10 years through age 75 years, unless early forms of precancerous polyps or small growths are found.  People who are at an increased risk for hepatitis B should be screened for this virus. You are considered at high risk for hepatitis B if:  You were born in a country where hepatitis B occurs often. Talk with your health care provider about which countries are considered high risk.  Your parents were born in a high-risk country and you have not received a shot to protect against hepatitis B (hepatitis B vaccine).  You have HIV or AIDS.  You use needles to inject street drugs.  You live with, or have sex with, someone who has hepatitis B.  You get hemodialysis treatment.  You take certain medicines for conditions like cancer, organ transplantation, and autoimmune conditions.  Hepatitis C blood testing is recommended for all people born from 1945 through 1965 and any individual with known risks for hepatitis C.  Practice safe sex. Use condoms and avoid high-risk sexual practices to reduce the spread of sexually transmitted infections (STIs). STIs include gonorrhea, chlamydia, syphilis, trichomonas, herpes, HPV, and human immunodeficiency virus (HIV). Herpes, HIV, and HPV are viral illnesses that have no cure. They can result in disability, cancer, and death.  You should be screened for sexually transmitted illnesses (STIs) including gonorrhea and chlamydia if:  You are sexually active and are younger than 24 years.  You are older than 24 years and your health care provider tells you that you are at risk for this type of infection.  Your sexual  activity has changed since you were last screened and you are at an increased risk for chlamydia or gonorrhea. Ask your health care provider if you are at risk.  If you are at risk of being infected with HIV, it is recommended that you take a prescription medicine daily to prevent HIV infection. This is called preexposure prophylaxis (PrEP). You are considered at risk if:  You are sexually active and do not regularly use condoms or know the HIV status of your partner(s).  You take drugs by injection.  You are sexually active with a partner who has HIV.  Talk with your health care provider about whether you are at high risk of being infected with HIV. If   you choose to begin PrEP, you should first be tested for HIV. You should then be tested every 3 months for as long as you are taking PrEP.  Osteoporosis is a disease in which the bones lose minerals and strength with aging. This can result in serious bone fractures or breaks. The risk of osteoporosis can be identified using a bone density scan. Women ages 1 years and over and women at risk for fractures or osteoporosis should discuss screening with their health care providers. Ask your health care provider whether you should take a calcium supplement or vitamin D to reduce the rate of osteoporosis.  Menopause can be associated with physical symptoms and risks. Hormone replacement therapy is available to decrease symptoms and risks. You should talk to your health care provider about whether hormone replacement therapy is right for you.  Use sunscreen. Apply sunscreen liberally and repeatedly throughout the day. You should seek shade when your shadow is shorter than you. Protect yourself by wearing long sleeves, pants, a wide-brimmed hat, and sunglasses year round, whenever you are outdoors.  Once a month, do a whole body skin exam, using a mirror to look at the skin on your back. Tell your health care provider of new moles, moles that have irregular  borders, moles that are larger than a pencil eraser, or moles that have changed in shape or color.  Stay current with required vaccines (immunizations).  Influenza vaccine. All adults should be immunized every year.  Tetanus, diphtheria, and acellular pertussis (Td, Tdap) vaccine. Pregnant women should receive 1 dose of Tdap vaccine during each pregnancy. The dose should be obtained regardless of the length of time since the last dose. Immunization is preferred during the 27th-36th week of gestation. An adult who has not previously received Tdap or who does not know her vaccine status should receive 1 dose of Tdap. This initial dose should be followed by tetanus and diphtheria toxoids (Td) booster doses every 10 years. Adults with an unknown or incomplete history of completing a 3-dose immunization series with Td-containing vaccines should begin or complete a primary immunization series including a Tdap dose. Adults should receive a Td booster every 10 years.  Varicella vaccine. An adult without evidence of immunity to varicella should receive 2 doses or a second dose if she has previously received 1 dose. Pregnant females who do not have evidence of immunity should receive the first dose after pregnancy. This first dose should be obtained before leaving the health care facility. The second dose should be obtained 4-8 weeks after the first dose.  Human papillomavirus (HPV) vaccine. Females aged 13-26 years who have not received the vaccine previously should obtain the 3-dose series. The vaccine is not recommended for use in pregnant females. However, pregnancy testing is not needed before receiving a dose. If a female is found to be pregnant after receiving a dose, no treatment is needed. In that case, the remaining doses should be delayed until after the pregnancy. Immunization is recommended for any person with an immunocompromised condition through the age of 24 years if she did not get any or all doses  earlier. During the 3-dose series, the second dose should be obtained 4-8 weeks after the first dose. The third dose should be obtained 24 weeks after the first dose and 16 weeks after the second dose.  Zoster vaccine. One dose is recommended for adults aged 97 years or older unless certain conditions are present.  Measles, mumps, and rubella (MMR) vaccine. Adults born  before 1957 generally are considered immune to measles and mumps. Adults born in [birth year] or later should have 1 or more doses of MMR vaccine unless there is a contraindication to the vaccine or there is laboratory evidence of immunity to each of the three diseases. A routine second dose of MMR vaccine should be obtained at least 28 days after the first dose for students attending postsecondary schools, health care workers, or international travelers. People who received inactivated measles vaccine or an unknown type of measles vaccine during 1963-1967 should receive 2 doses of MMR vaccine. People who received inactivated mumps vaccine or an unknown type of mumps vaccine before 1979 and are at high risk for mumps infection should consider immunization with 2 doses of MMR vaccine. For females of childbearing age, rubella immunity should be determined. If there is no evidence of immunity, females who are not pregnant should be vaccinated. If there is no evidence of immunity, females who are pregnant should delay immunization until after pregnancy. Unvaccinated health care workers born before 60 who lack laboratory evidence of measles, mumps, or rubella immunity or laboratory confirmation of disease should consider measles and mumps immunization with 2 doses of MMR vaccine or rubella immunization with 1 dose of MMR vaccine.  Pneumococcal 13-valent conjugate (PCV13) vaccine. When indicated, a person who is uncertain of his immunization history and has no record of immunization should receive the PCV13 vaccine. All adults 61 years of age and older  should receive this vaccine. An adult aged 92 years or older who has certain medical conditions and has not been previously immunized should receive 1 dose of PCV13 vaccine. This PCV13 should be followed with a dose of pneumococcal polysaccharide (PPSV23) vaccine. Adults who are at high risk for pneumococcal disease should obtain the PPSV23 vaccine at least 8 weeks after the dose of PCV13 vaccine. Adults older than 21 years of age who have normal immune system function should obtain the PPSV23 vaccine dose at least 1 year after the dose of PCV13 vaccine.  Pneumococcal polysaccharide (PPSV23) vaccine. When PCV13 is also indicated, PCV13 should be obtained first. All adults aged 2 years and older should be immunized. An adult younger than age 30 years who has certain medical conditions should be immunized. Any person who resides in a nursing home or long-term care facility should be immunized. An adult smoker should be immunized. People with an immunocompromised condition and certain other conditions should receive both PCV13 and PPSV23 vaccines. People with human immunodeficiency virus (HIV) infection should be immunized as soon as possible after diagnosis. Immunization during chemotherapy or radiation therapy should be avoided. Routine use of PPSV23 vaccine is not recommended for American Indians, [place] Natives, or people younger than 65 years unless there are medical conditions that require PPSV23 vaccine. When indicated, people who have unknown immunization and have no record of immunization should receive PPSV23 vaccine. One-time revaccination 5 years after the first dose of PPSV23 is recommended for people aged 19-64 years who have chronic kidney failure, nephrotic syndrome, asplenia, or immunocompromised conditions. People who received 1-2 doses of PPSV23 before age 44 years should receive another dose of PPSV23 vaccine at age 83 years or later if at least 5 years have passed since the previous dose. Doses  of PPSV23 are not needed for people immunized with PPSV23 at or after age 20 years.  Meningococcal vaccine. Adults with asplenia or persistent complement component deficiencies should receive 2 doses of quadrivalent meningococcal conjugate (MenACWY-D) vaccine. The doses should be obtained  at least 2 months apart. Microbiologists working with certain meningococcal bacteria, [place] recruits, people at risk during an outbreak, and people who travel to or live in countries with a high rate of meningitis should be immunized. A first-year college student up through age 28 years who is living in a residence hall should receive a dose if she did not receive a dose on or after her 16th birthday. Adults who have certain high-risk conditions should receive one or more doses of vaccine.  Hepatitis A vaccine. Adults who wish to be protected from this disease, have certain high-risk conditions, work with hepatitis A-infected animals, work in hepatitis A research labs, or travel to or work in countries with a high rate of hepatitis A should be immunized. Adults who were previously unvaccinated and who anticipate close contact with an international adoptee during the first 60 days after arrival in the [place] from a country with a high rate of hepatitis A should be immunized.  Hepatitis B vaccine. Adults who wish to be protected from this disease, have certain high-risk conditions, may be exposed to blood or other infectious body fluids, are household contacts or sex partners of hepatitis B positive people, are clients or workers in certain care facilities, or travel to or work in countries with a high rate of hepatitis B should be immunized.  Haemophilus influenzae type b (Hib) vaccine. A previously unvaccinated person with asplenia or sickle cell disease or having a scheduled splenectomy should receive 1 dose of Hib vaccine. Regardless of previous immunization, a recipient of a hematopoietic stem cell transplant  should receive a 3-dose series 6-12 months after her successful transplant. Hib vaccine is not recommended for adults with HIV infection. Preventive Services / Frequency Ages 71 to 87 years  Blood pressure check.** / Every 3-5 years.  Lipid and cholesterol check.** / Every 5 years beginning at age 1.  Clinical breast exam.** / Every 3 years for women in their 3s and 31s.  BRCA-related cancer risk assessment.** / For women who have family members with a BRCA-related cancer (breast, ovarian, tubal, or peritoneal cancers).  Pap test.** / Every 2 years from ages 50 through 86. Every 3 years starting at age 87 through age 7 or 75 with a history of 3 consecutive normal Pap tests.  HPV screening.** / Every 3 years from ages 59 through ages 35 to 6 with a history of 3 consecutive normal Pap tests.  Hepatitis C blood test.** / For any individual with known risks for hepatitis C.  Skin self-exam. / Monthly.  Influenza vaccine. / Every year.  Tetanus, diphtheria, and acellular pertussis (Tdap, Td) vaccine.** / Consult your health care provider. Pregnant women should receive 1 dose of Tdap vaccine during each pregnancy. 1 dose of Td every 10 years.  Varicella vaccine.** / Consult your health care provider. Pregnant females who do not have evidence of immunity should receive the first dose after pregnancy.  HPV vaccine. / 3 doses over 6 months, if 72 and younger. The vaccine is not recommended for use in pregnant females. However, pregnancy testing is not needed before receiving a dose.  Measles, mumps, rubella (MMR) vaccine.** / You need at least 1 dose of MMR if you were born in 1957 or later. You may also need a 2nd dose. For females of childbearing age, rubella immunity should be determined. If there is no evidence of immunity, females who are not pregnant should be vaccinated. If there is no evidence of immunity, females who are  pregnant should delay immunization until after  pregnancy.  Pneumococcal 13-valent conjugate (PCV13) vaccine.** / Consult your health care provider.  Pneumococcal polysaccharide (PPSV23) vaccine.** / 1 to 2 doses if you smoke cigarettes or if you have certain conditions.  Meningococcal vaccine.** / 1 dose if you are age 87 to 44 years and a Market researcher living in a residence hall, or have one of several medical conditions, you need to get vaccinated against meningococcal disease. You may also need additional booster doses.  Hepatitis A vaccine.** / Consult your health care provider.  Hepatitis B vaccine.** / Consult your health care provider.  Haemophilus influenzae type b (Hib) vaccine.** / Consult your health care provider. Ages 86 to 38 years  Blood pressure check.** / Every year.  Lipid and cholesterol check.** / Every 5 years beginning at age 49 years.  Lung cancer screening. / Every year if you are aged 71-80 years and have a 30-pack-year history of smoking and currently smoke or have quit within the past 15 years. Yearly screening is stopped once you have quit smoking for at least 15 years or develop a health problem that would prevent you from having lung cancer treatment.  Clinical breast exam.** / Every year after age 51 years.  BRCA-related cancer risk assessment.** / For women who have family members with a BRCA-related cancer (breast, ovarian, tubal, or peritoneal cancers).  Mammogram.** / Every year beginning at age 18 years and continuing for as long as you are in good health. Consult with your health care provider.  Pap test.** / Every 3 years starting at age 63 years through age 37 or 57 years with a history of 3 consecutive normal Pap tests.  HPV screening.** / Every 3 years from ages 41 years through ages 76 to 23 years with a history of 3 consecutive normal Pap tests.  Fecal occult blood test (FOBT) of stool. / Every year beginning at age 36 years and continuing until age 51 years. You may not need  to do this test if you get a colonoscopy every 10 years.  Flexible sigmoidoscopy or colonoscopy.** / Every 5 years for a flexible sigmoidoscopy or every 10 years for a colonoscopy beginning at age 36 years and continuing until age 35 years.  Hepatitis C blood test.** / For all people born from [birth year] through 1965 and any individual with known risks for hepatitis C.  Skin self-exam. / Monthly.  Influenza vaccine. / Every year.  Tetanus, diphtheria, and acellular pertussis (Tdap/Td) vaccine.** / Consult your health care provider. Pregnant women should receive 1 dose of Tdap vaccine during each pregnancy. 1 dose of Td every 10 years.  Varicella vaccine.** / Consult your health care provider. Pregnant females who do not have evidence of immunity should receive the first dose after pregnancy.  Zoster vaccine.** / 1 dose for adults aged 73 years or older.  Measles, mumps, rubella (MMR) vaccine.** / You need at least 1 dose of MMR if you were born in 1957 or later. You may also need a second dose. For females of childbearing age, rubella immunity should be determined. If there is no evidence of immunity, females who are not pregnant should be vaccinated. If there is no evidence of immunity, females who are pregnant should delay immunization until after pregnancy.  Pneumococcal 13-valent conjugate (PCV13) vaccine.** / Consult your health care provider.  Pneumococcal polysaccharide (PPSV23) vaccine.** / 1 to 2 doses if you smoke cigarettes or if you have certain conditions.  Meningococcal vaccine.** /  Consult your health care provider.  Hepatitis A vaccine.** / Consult your health care provider.  Hepatitis B vaccine.** / Consult your health care provider.  Haemophilus influenzae type b (Hib) vaccine.** / Consult your health care provider. Ages 62 years and over  Blood pressure check.** / Every year.  Lipid and cholesterol check.** / Every 5 years beginning at age 3 years.  Lung cancer  screening. / Every year if you are aged 34-80 years and have a 30-pack-year history of smoking and currently smoke or have quit within the past 15 years. Yearly screening is stopped once you have quit smoking for at least 15 years or develop a health problem that would prevent you from having lung cancer treatment.  Clinical breast exam.** / Every year after age 29 years.  BRCA-related cancer risk assessment.** / For women who have family members with a BRCA-related cancer (breast, ovarian, tubal, or peritoneal cancers).  Mammogram.** / Every year beginning at age 99 years and continuing for as long as you are in good health. Consult with your health care provider.  Pap test.** / Every 3 years starting at age 71 years through age 90 or 36 years with 3 consecutive normal Pap tests. Testing can be stopped between 65 and 70 years with 3 consecutive normal Pap tests and no abnormal Pap or HPV tests in the past 10 years.  HPV screening.** / Every 3 years from ages 64 years through ages 81 or 1 years with a history of 3 consecutive normal Pap tests. Testing can be stopped between 65 and 70 years with 3 consecutive normal Pap tests and no abnormal Pap or HPV tests in the past 10 years.  Fecal occult blood test (FOBT) of stool. / Every year beginning at age 50 years and continuing until age 69 years. You may not need to do this test if you get a colonoscopy every 10 years.  Flexible sigmoidoscopy or colonoscopy.** / Every 5 years for a flexible sigmoidoscopy or every 10 years for a colonoscopy beginning at age 40 years and continuing until age 2 years.  Hepatitis C blood test.** / For all people born from [birth year] through 1965 and any individual with known risks for hepatitis C.  Osteoporosis screening.** / A one-time screening for women ages 48 years and over and women at risk for fractures or osteoporosis.  Skin self-exam. / Monthly.  Influenza vaccine. / Every year.  Tetanus, diphtheria, and  acellular pertussis (Tdap/Td) vaccine.** / 1 dose of Td every 10 years.  Varicella vaccine.** / Consult your health care provider.  Zoster vaccine.** / 1 dose for adults aged 60 years or older.  Pneumococcal 13-valent conjugate (PCV13) vaccine.** / Consult your health care provider.  Pneumococcal polysaccharide (PPSV23) vaccine.** / 1 dose for all adults aged 14 years and older.  Meningococcal vaccine.** / Consult your health care provider.  Hepatitis A vaccine.** / Consult your health care provider.  Hepatitis B vaccine.** / Consult your health care provider.  Haemophilus influenzae type b (Hib) vaccine.** / Consult your health care provider. ** Family history and personal history of risk and conditions may change your health care provider's recommendations.   This information is not intended to replace advice given to you by your health care provider. Make sure you discuss any questions you have with your health care provider.   Document Released: 04/05/2001 Document Revised: 02/28/2014 Document Reviewed: 07/05/2010 Elsevier Interactive Patient Education 2016 [place] you for enrolling in [place]. Please follow the instructions below  to securely access your online medical record. MyChart allows you to send messages to your doctor, view your test results, renew your prescriptions, schedule appointments, and more.  How Do I Sign Up? 1. In your Internet browser, go to [email or web address]. 2. Click on the New  User? link in the Sign In box.  3. Enter your MyChart Access Code exactly as it appears below. You will not need to use this code after you have completed the sign-up process. If you do not sign up before the expiration date, you must request a new code. MyChart Access Code: [email or web address] Expires: 12/01/2015 10:02 AM  4. Enter the last four digits of your Social Security Number (xxxx) and Date of Birth (mm/dd/yyyy) as indicated and click Next.  You will be taken to the next sign-up page. 5. Create a MyChart ID. This will be your MyChart login ID and cannot be changed, so think of one that is secure and easy to remember. 6. Create a MyChart password. You can change your password at any time. 7. Enter your Password Reset Question and Answer and click Next. This can be used at a later time if you forget your password.  8. Select your communication preference, and if applicable enter your e-mail address. You will receive e-mail notification when new information is available in MyChart by choosing to receive e-mail notifications and filling in your e-mail. 9. Click Sign In. You can now view your medical record.   Additional Information If you have questions, you can email [email or web address] or call [phone] to talk to our [facility] staff. Remember, MyChart is NOT to be used for urgent needs. For medical emergencies, dial 911.   Trichomoniasis Trichomoniasis is an infection caused by an organism called Trichomonas. The infection can affect both women and men. In women, the outer female genitalia and the vagina are affected. In men, the penis is mainly affected, but the prostate and other reproductive organs can also be involved. Trichomoniasis is a sexually transmitted infection (STI) and is most often passed to another person through sexual contact.  RISK FACTORS  Having unprotected sexual intercourse.  Having sexual intercourse with an infected partner. SIGNS AND SYMPTOMS  Symptoms of trichomoniasis in women include:  Abnormal gray-green frothy vaginal discharge.  Itching and irritation of the vagina.  Itching and irritation of the area outside the vagina. Symptoms of trichomoniasis in men include:   Penile discharge with or without pain.  Pain during urination. This results from inflammation of the urethra. DIAGNOSIS  Trichomoniasis may be found during a Pap test or physical exam. Your health care provider may  use one of the following methods to help diagnose this infection:  Testing the pH of the vagina with a test tape.  Using a vaginal swab test that checks for the Trichomonas organism. A test is available that provides results within a few minutes.  Examining a urine sample.  Testing vaginal secretions. Your health care provider may test you for other STIs, including HIV. TREATMENT   You may be given medicine to fight the infection. Women should inform their health care provider if they could be or are pregnant. Some medicines used to treat the infection should not be taken during pregnancy.  Your health care provider may recommend over-the-counter medicines or creams to decrease itching or irritation.  Your sexual partner will need to be treated if infected.  Your health care provider may test you for infection again 3 months after treatment. HOME  CARE INSTRUCTIONS   Take medicines only as directed by your health care provider.  Take over-the-counter medicine for itching or irritation as directed by your health care provider.  Do not have sexual intercourse while you have the infection.  Women should not douche or wear tampons while they have the infection.  Discuss your infection with your partner. Your partner may have gotten the infection from you, or you may have gotten it from your partner.  Have your sex partner get examined and treated if necessary.  Practice safe, informed, and protected sex.  See your health care provider for other STI testing. SEEK MEDICAL CARE IF:   You still have symptoms after you finish your medicine.  You develop abdominal pain.  You have pain when you urinate.  You have bleeding after sexual intercourse.  You develop a rash.  Your medicine makes you sick or makes you throw up (vomit). MAKE SURE YOU:  Understand these instructions.  Will watch your condition.  Will get help right away if you are not doing well or get worse.    This information is not intended to replace advice given to you by your health care provider. Make sure you discuss any questions you have with your health care provider.   Document Released: 08/03/2000 Document Revised: 02/28/2014 Document Reviewed: 11/19/2012 Elsevier Interactive Patient Education [employer].
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
## 2015-10-03 LAB — VITAMIN D 25 HYDROXY (VIT D DEFICIENCY, FRACTURES): VIT D 25 HYDROXY: 24.1 ng/mL — AB (ref 30.0–100.0)

## 2015-10-05 LAB — CT NG TV HSV BY NAA
CHLAMYDIA BY NAA: NEGATIVE
Gonococcus by NAA: NEGATIVE
HSV 1 NAA: NEGATIVE
HSV 2 NAA: NEGATIVE
Trich vag by NAA: NEGATIVE

## 2015-11-12 ENCOUNTER — Ambulatory Visit (INDEPENDENT_AMBULATORY_CARE_PROVIDER_SITE_OTHER): Payer: 59 | Admitting: Obstetrics and Gynecology

## 2015-11-12 ENCOUNTER — Encounter: Payer: Self-pay | Admitting: Obstetrics and Gynecology

## 2015-11-12 VITALS — BP 118/74 | HR 88 | Ht 65.0 in | Wt 172.0 lb

## 2015-11-12 DIAGNOSIS — Z09 Encounter for follow-up examination after completed treatment for conditions other than malignant neoplasm: Secondary | ICD-10-CM | POA: Diagnosis not present

## 2015-11-12 MED ORDER — METRONIDAZOLE 500 MG PO TABS: ORAL_TABLET | ORAL | 1 refills | Status: DC

## 2015-11-12 NOTE — Progress Notes (Signed)
Subjective:     Patient ID: Sarah Haney, female   DOB: [birth year], 20 y.o.   MRN: [id number]  HPI Seen 1 month ago and found to have trich, and here for TOC. States partner did not take all of medication. Request refill for him.  Review of Systems negative    Objective:   Physical Exam A&O x4 Well groomed female Blood pressure 118/74, pulse 88, height 5\' 5"  (1.651 m), weight 172 lb (78 kg), last menstrual period 11/07/2015, not currently breastfeeding. Pelvic exam: normal external genitalia, vulva, vagina, cervix, uterus and adnexa, WET MOUNT done - results: negative for pathogens, normal epithelial cells.    Assessment:     TOC trich    Plan:     Reassured of normal findings, med refilled for partner. RTC 1 year  Melody Cornelius, [place]

## 2015-11-12 NOTE — Patient Instructions (Signed)
Preventive Care for Adults, Female A healthy lifestyle and preventive care can promote health and wellness. Preventive health guidelines for women include the following key practices.  A routine yearly physical is a good way to check with your health care provider about your health and preventive screening. It is a chance to share any concerns and updates on your health and to receive a thorough exam.  Visit your dentist for a routine exam and preventive care every 6 months. Brush your teeth twice a day and floss once a day. Good oral hygiene prevents tooth decay and gum disease.  The frequency of eye exams is based on your age, health, family medical history, use of contact lenses, and other factors. Follow your health care provider's recommendations for frequency of eye exams.  Eat a healthy diet. Foods like vegetables, fruits, whole grains, low-fat dairy products, and lean protein foods contain the nutrients you need without too many calories. Decrease your intake of foods high in solid fats, added sugars, and salt. Eat the right amount of calories for you.Get information about a proper diet from your health care provider, if necessary.  Regular physical exercise is one of the most important things you can do for your health. Most adults should get at least 150 minutes of moderate-intensity exercise (any activity that increases your heart rate and causes you to sweat) each week. In addition, most adults need muscle-strengthening exercises on 2 or more days a week.  Maintain a healthy weight. The body mass index (BMI) is a screening tool to identify possible weight problems. It provides an estimate of body fat based on height and weight. Your health care provider can find your BMI and can help you achieve or maintain a healthy weight.For adults 20 years and older:  A BMI below 18.5 is considered underweight.  A BMI of 18.5 to 24.9 is normal.  A BMI of 25 to 29.9 is considered  overweight.  A BMI of 30 and above is considered obese.  Maintain normal blood lipids and cholesterol levels by exercising and minimizing your intake of saturated fat. Eat a balanced diet with plenty of fruit and vegetables. Blood tests for lipids and cholesterol should begin at age 64 and be repeated every 5 years. If your lipid or cholesterol levels are high, you are over 50, or you are at high risk for heart disease, you may need your cholesterol levels checked more frequently.Ongoing high lipid and cholesterol levels should be treated with medicines if diet and exercise are not working.  If you smoke, find out from your health care provider how to quit. If you do not use tobacco, do not start.  Lung cancer screening is recommended for adults aged 52-80 years who are at high risk for developing lung cancer because of a history of smoking. A yearly low-dose CT scan of the lungs is recommended for people who have at least a 30-pack-year history of smoking and are a current smoker or have quit within the past 15 years. A pack year of smoking is smoking an average of 1 pack of cigarettes a day for 1 year (for example: 1 pack a day for 30 years or 2 packs a day for 15 years). Yearly screening should continue until the smoker has stopped smoking for at least 15 years. Yearly screening should be stopped for people who develop a health problem that would prevent them from having lung cancer treatment.  If you are pregnant, do not drink alcohol. If you are  breastfeeding, be very cautious about drinking alcohol. If you are not pregnant and choose to drink alcohol, do not have more than 1 drink per day. One drink is considered to be 12 ounces (355 mL) of beer, 5 ounces (148 mL) of wine, or 1.5 ounces (44 mL) of liquor.  Avoid use of street drugs. Do not share needles with anyone. Ask for help if you need support or instructions about stopping the use of drugs.  High blood pressure causes heart disease and  increases the risk of stroke. Your blood pressure should be checked at least every 1 to 2 years. Ongoing high blood pressure should be treated with medicines if weight loss and exercise do not work.  If you are 25-78 years old, ask your health care provider if you should take aspirin to prevent strokes.  Diabetes screening is done by taking a blood sample to check your blood glucose level after you have not eaten for a certain period of time (fasting). If you are not overweight and you do not have risk factors for diabetes, you should be screened once every 3 years starting at age 86. If you are overweight or obese and you are 3-87 years of age, you should be screened for diabetes every year as part of your cardiovascular risk assessment.  Breast cancer screening is essential preventive care for women. You should practice "breast self-awareness." This means understanding the normal appearance and feel of your breasts and may include breast self-examination. Any changes detected, no matter how small, should be reported to a health care provider. Women in their 66s and 30s should have a clinical breast exam (CBE) by a health care provider as part of a regular health exam every 1 to 3 years. After age 43, women should have a CBE every year. Starting at age 37, women should consider having a mammogram (breast X-ray test) every year. Women who have a family history of breast cancer should talk to their health care provider about genetic screening. Women at a high risk of breast cancer should talk to their health care providers about having an MRI and a mammogram every year.  Breast cancer gene (BRCA)-related cancer risk assessment is recommended for women who have family members with BRCA-related cancers. BRCA-related cancers include breast, ovarian, tubal, and peritoneal cancers. Having family members with these cancers may be associated with an increased risk for harmful changes (mutations) in the breast  cancer genes BRCA1 and BRCA2. Results of the assessment will determine the need for genetic counseling and BRCA1 and BRCA2 testing.  Your health care provider may recommend that you be screened regularly for cancer of the pelvic organs (ovaries, uterus, and vagina). This screening involves a pelvic examination, including checking for microscopic changes to the surface of your cervix (Pap test). You may be encouraged to have this screening done every 3 years, beginning at age 78.  For women ages 79-65, health care providers may recommend pelvic exams and Pap testing every 3 years, or they may recommend the Pap and pelvic exam, combined with testing for human papilloma virus (HPV), every 5 years. Some types of HPV increase your risk of cervical cancer. Testing for HPV may also be done on women of any age with unclear Pap test results.  Other health care providers may not recommend any screening for nonpregnant women who are considered low risk for pelvic cancer and who do not have symptoms. Ask your health care provider if a screening pelvic exam is right for  you.  If you have had past treatment for cervical cancer or a condition that could lead to cancer, you need Pap tests and screening for cancer for at least 20 years after your treatment. If Pap tests have been discontinued, your risk factors (such as having a new sexual partner) need to be reassessed to determine if screening should resume. Some women have medical problems that increase the chance of getting cervical cancer. In these cases, your health care provider may recommend more frequent screening and Pap tests.  Colorectal cancer can be detected and often prevented. Most routine colorectal cancer screening begins at the age of 50 years and continues through age 75 years. However, your health care provider may recommend screening at an earlier age if you have risk factors for colon cancer. On a yearly basis, your health care provider may provide  home test kits to check for hidden blood in the stool. Use of a small camera at the end of a tube, to directly examine the colon (sigmoidoscopy or colonoscopy), can detect the earliest forms of colorectal cancer. Talk to your health care provider about this at age 50, when routine screening begins. Direct exam of the colon should be repeated every 5-10 years through age 75 years, unless early forms of precancerous polyps or small growths are found.  People who are at an increased risk for hepatitis B should be screened for this virus. You are considered at high risk for hepatitis B if:  You were born in a country where hepatitis B occurs often. Talk with your health care provider about which countries are considered high risk.  Your parents were born in a high-risk country and you have not received a shot to protect against hepatitis B (hepatitis B vaccine).  You have HIV or AIDS.  You use needles to inject street drugs.  You live with, or have sex with, someone who has hepatitis B.  You get hemodialysis treatment.  You take certain medicines for conditions like cancer, organ transplantation, and autoimmune conditions.  Hepatitis C blood testing is recommended for all people born from 1945 through 1965 and any individual with known risks for hepatitis C.  Practice safe sex. Use condoms and avoid high-risk sexual practices to reduce the spread of sexually transmitted infections (STIs). STIs include gonorrhea, chlamydia, syphilis, trichomonas, herpes, HPV, and human immunodeficiency virus (HIV). Herpes, HIV, and HPV are viral illnesses that have no cure. They can result in disability, cancer, and death.  You should be screened for sexually transmitted illnesses (STIs) including gonorrhea and chlamydia if:  You are sexually active and are younger than 24 years.  You are older than 24 years and your health care provider tells you that you are at risk for this type of infection.  Your sexual  activity has changed since you were last screened and you are at an increased risk for chlamydia or gonorrhea. Ask your health care provider if you are at risk.  If you are at risk of being infected with HIV, it is recommended that you take a prescription medicine daily to prevent HIV infection. This is called preexposure prophylaxis (PrEP). You are considered at risk if:  You are sexually active and do not regularly use condoms or know the HIV status of your partner(s).  You take drugs by injection.  You are sexually active with a partner who has HIV.  Talk with your health care provider about whether you are at high risk of being infected with HIV. If   you choose to begin PrEP, you should first be tested for HIV. You should then be tested every 3 months for as long as you are taking PrEP.  Osteoporosis is a disease in which the bones lose minerals and strength with aging. This can result in serious bone fractures or breaks. The risk of osteoporosis can be identified using a bone density scan. Women ages 1 years and over and women at risk for fractures or osteoporosis should discuss screening with their health care providers. Ask your health care provider whether you should take a calcium supplement or vitamin D to reduce the rate of osteoporosis.  Menopause can be associated with physical symptoms and risks. Hormone replacement therapy is available to decrease symptoms and risks. You should talk to your health care provider about whether hormone replacement therapy is right for you.  Use sunscreen. Apply sunscreen liberally and repeatedly throughout the day. You should seek shade when your shadow is shorter than you. Protect yourself by wearing long sleeves, pants, a wide-brimmed hat, and sunglasses year round, whenever you are outdoors.  Once a month, do a whole body skin exam, using a mirror to look at the skin on your back. Tell your health care provider of new moles, moles that have irregular  borders, moles that are larger than a pencil eraser, or moles that have changed in shape or color.  Stay current with required vaccines (immunizations).  Influenza vaccine. All adults should be immunized every year.  Tetanus, diphtheria, and acellular pertussis (Td, Tdap) vaccine. Pregnant women should receive 1 dose of Tdap vaccine during each pregnancy. The dose should be obtained regardless of the length of time since the last dose. Immunization is preferred during the 27th-36th week of gestation. An adult who has not previously received Tdap or who does not know her vaccine status should receive 1 dose of Tdap. This initial dose should be followed by tetanus and diphtheria toxoids (Td) booster doses every 10 years. Adults with an unknown or incomplete history of completing a 3-dose immunization series with Td-containing vaccines should begin or complete a primary immunization series including a Tdap dose. Adults should receive a Td booster every 10 years.  Varicella vaccine. An adult without evidence of immunity to varicella should receive 2 doses or a second dose if she has previously received 1 dose. Pregnant females who do not have evidence of immunity should receive the first dose after pregnancy. This first dose should be obtained before leaving the health care facility. The second dose should be obtained 4-8 weeks after the first dose.  Human papillomavirus (HPV) vaccine. Females aged 13-26 years who have not received the vaccine previously should obtain the 3-dose series. The vaccine is not recommended for use in pregnant females. However, pregnancy testing is not needed before receiving a dose. If a female is found to be pregnant after receiving a dose, no treatment is needed. In that case, the remaining doses should be delayed until after the pregnancy. Immunization is recommended for any person with an immunocompromised condition through the age of 24 years if she did not get any or all doses  earlier. During the 3-dose series, the second dose should be obtained 4-8 weeks after the first dose. The third dose should be obtained 24 weeks after the first dose and 16 weeks after the second dose.  Zoster vaccine. One dose is recommended for adults aged 97 years or older unless certain conditions are present.  Measles, mumps, and rubella (MMR) vaccine. Adults born  before 1957 generally are considered immune to measles and mumps. Adults born in [birth year] or later should have 1 or more doses of MMR vaccine unless there is a contraindication to the vaccine or there is laboratory evidence of immunity to each of the three diseases. A routine second dose of MMR vaccine should be obtained at least 28 days after the first dose for students attending postsecondary schools, health care workers, or international travelers. People who received inactivated measles vaccine or an unknown type of measles vaccine during 1963-1967 should receive 2 doses of MMR vaccine. People who received inactivated mumps vaccine or an unknown type of mumps vaccine before 1979 and are at high risk for mumps infection should consider immunization with 2 doses of MMR vaccine. For females of childbearing age, rubella immunity should be determined. If there is no evidence of immunity, females who are not pregnant should be vaccinated. If there is no evidence of immunity, females who are pregnant should delay immunization until after pregnancy. Unvaccinated health care workers born before 60 who lack laboratory evidence of measles, mumps, or rubella immunity or laboratory confirmation of disease should consider measles and mumps immunization with 2 doses of MMR vaccine or rubella immunization with 1 dose of MMR vaccine.  Pneumococcal 13-valent conjugate (PCV13) vaccine. When indicated, a person who is uncertain of his immunization history and has no record of immunization should receive the PCV13 vaccine. All adults 61 years of age and older  should receive this vaccine. An adult aged 92 years or older who has certain medical conditions and has not been previously immunized should receive 1 dose of PCV13 vaccine. This PCV13 should be followed with a dose of pneumococcal polysaccharide (PPSV23) vaccine. Adults who are at high risk for pneumococcal disease should obtain the PPSV23 vaccine at least 8 weeks after the dose of PCV13 vaccine. Adults older than 21 years of age who have normal immune system function should obtain the PPSV23 vaccine dose at least 1 year after the dose of PCV13 vaccine.  Pneumococcal polysaccharide (PPSV23) vaccine. When PCV13 is also indicated, PCV13 should be obtained first. All adults aged 2 years and older should be immunized. An adult younger than age 30 years who has certain medical conditions should be immunized. Any person who resides in a nursing home or long-term care facility should be immunized. An adult smoker should be immunized. People with an immunocompromised condition and certain other conditions should receive both PCV13 and PPSV23 vaccines. People with human immunodeficiency virus (HIV) infection should be immunized as soon as possible after diagnosis. Immunization during chemotherapy or radiation therapy should be avoided. Routine use of PPSV23 vaccine is not recommended for American Indians, [place] Natives, or people younger than 65 years unless there are medical conditions that require PPSV23 vaccine. When indicated, people who have unknown immunization and have no record of immunization should receive PPSV23 vaccine. One-time revaccination 5 years after the first dose of PPSV23 is recommended for people aged 19-64 years who have chronic kidney failure, nephrotic syndrome, asplenia, or immunocompromised conditions. People who received 1-2 doses of PPSV23 before age 44 years should receive another dose of PPSV23 vaccine at age 83 years or later if at least 5 years have passed since the previous dose. Doses  of PPSV23 are not needed for people immunized with PPSV23 at or after age 20 years.  Meningococcal vaccine. Adults with asplenia or persistent complement component deficiencies should receive 2 doses of quadrivalent meningococcal conjugate (MenACWY-D) vaccine. The doses should be obtained  at least 2 months apart. Microbiologists working with certain meningococcal bacteria, [place] recruits, people at risk during an outbreak, and people who travel to or live in countries with a high rate of meningitis should be immunized. A first-year college student up through age 28 years who is living in a residence hall should receive a dose if she did not receive a dose on or after her 16th birthday. Adults who have certain high-risk conditions should receive one or more doses of vaccine.  Hepatitis A vaccine. Adults who wish to be protected from this disease, have certain high-risk conditions, work with hepatitis A-infected animals, work in hepatitis A research labs, or travel to or work in countries with a high rate of hepatitis A should be immunized. Adults who were previously unvaccinated and who anticipate close contact with an international adoptee during the first 60 days after arrival in the [place] from a country with a high rate of hepatitis A should be immunized.  Hepatitis B vaccine. Adults who wish to be protected from this disease, have certain high-risk conditions, may be exposed to blood or other infectious body fluids, are household contacts or sex partners of hepatitis B positive people, are clients or workers in certain care facilities, or travel to or work in countries with a high rate of hepatitis B should be immunized.  Haemophilus influenzae type b (Hib) vaccine. A previously unvaccinated person with asplenia or sickle cell disease or having a scheduled splenectomy should receive 1 dose of Hib vaccine. Regardless of previous immunization, a recipient of a hematopoietic stem cell transplant  should receive a 3-dose series 6-12 months after her successful transplant. Hib vaccine is not recommended for adults with HIV infection. Preventive Services / Frequency Ages 71 to 87 years  Blood pressure check.** / Every 3-5 years.  Lipid and cholesterol check.** / Every 5 years beginning at age 1.  Clinical breast exam.** / Every 3 years for women in their 3s and 31s.  BRCA-related cancer risk assessment.** / For women who have family members with a BRCA-related cancer (breast, ovarian, tubal, or peritoneal cancers).  Pap test.** / Every 2 years from ages 50 through 86. Every 3 years starting at age 87 through age 7 or 75 with a history of 3 consecutive normal Pap tests.  HPV screening.** / Every 3 years from ages 59 through ages 35 to 6 with a history of 3 consecutive normal Pap tests.  Hepatitis C blood test.** / For any individual with known risks for hepatitis C.  Skin self-exam. / Monthly.  Influenza vaccine. / Every year.  Tetanus, diphtheria, and acellular pertussis (Tdap, Td) vaccine.** / Consult your health care provider. Pregnant women should receive 1 dose of Tdap vaccine during each pregnancy. 1 dose of Td every 10 years.  Varicella vaccine.** / Consult your health care provider. Pregnant females who do not have evidence of immunity should receive the first dose after pregnancy.  HPV vaccine. / 3 doses over 6 months, if 72 and younger. The vaccine is not recommended for use in pregnant females. However, pregnancy testing is not needed before receiving a dose.  Measles, mumps, rubella (MMR) vaccine.** / You need at least 1 dose of MMR if you were born in 1957 or later. You may also need a 2nd dose. For females of childbearing age, rubella immunity should be determined. If there is no evidence of immunity, females who are not pregnant should be vaccinated. If there is no evidence of immunity, females who are  pregnant should delay immunization until after  pregnancy.  Pneumococcal 13-valent conjugate (PCV13) vaccine.** / Consult your health care provider.  Pneumococcal polysaccharide (PPSV23) vaccine.** / 1 to 2 doses if you smoke cigarettes or if you have certain conditions.  Meningococcal vaccine.** / 1 dose if you are age 87 to 44 years and a Market researcher living in a residence hall, or have one of several medical conditions, you need to get vaccinated against meningococcal disease. You may also need additional booster doses.  Hepatitis A vaccine.** / Consult your health care provider.  Hepatitis B vaccine.** / Consult your health care provider.  Haemophilus influenzae type b (Hib) vaccine.** / Consult your health care provider. Ages 86 to 38 years  Blood pressure check.** / Every year.  Lipid and cholesterol check.** / Every 5 years beginning at age 49 years.  Lung cancer screening. / Every year if you are aged 71-80 years and have a 30-pack-year history of smoking and currently smoke or have quit within the past 15 years. Yearly screening is stopped once you have quit smoking for at least 15 years or develop a health problem that would prevent you from having lung cancer treatment.  Clinical breast exam.** / Every year after age 51 years.  BRCA-related cancer risk assessment.** / For women who have family members with a BRCA-related cancer (breast, ovarian, tubal, or peritoneal cancers).  Mammogram.** / Every year beginning at age 18 years and continuing for as long as you are in good health. Consult with your health care provider.  Pap test.** / Every 3 years starting at age 63 years through age 37 or 57 years with a history of 3 consecutive normal Pap tests.  HPV screening.** / Every 3 years from ages 41 years through ages 76 to 23 years with a history of 3 consecutive normal Pap tests.  Fecal occult blood test (FOBT) of stool. / Every year beginning at age 36 years and continuing until age 51 years. You may not need  to do this test if you get a colonoscopy every 10 years.  Flexible sigmoidoscopy or colonoscopy.** / Every 5 years for a flexible sigmoidoscopy or every 10 years for a colonoscopy beginning at age 36 years and continuing until age 35 years.  Hepatitis C blood test.** / For all people born from [birth year] through 1965 and any individual with known risks for hepatitis C.  Skin self-exam. / Monthly.  Influenza vaccine. / Every year.  Tetanus, diphtheria, and acellular pertussis (Tdap/Td) vaccine.** / Consult your health care provider. Pregnant women should receive 1 dose of Tdap vaccine during each pregnancy. 1 dose of Td every 10 years.  Varicella vaccine.** / Consult your health care provider. Pregnant females who do not have evidence of immunity should receive the first dose after pregnancy.  Zoster vaccine.** / 1 dose for adults aged 73 years or older.  Measles, mumps, rubella (MMR) vaccine.** / You need at least 1 dose of MMR if you were born in 1957 or later. You may also need a second dose. For females of childbearing age, rubella immunity should be determined. If there is no evidence of immunity, females who are not pregnant should be vaccinated. If there is no evidence of immunity, females who are pregnant should delay immunization until after pregnancy.  Pneumococcal 13-valent conjugate (PCV13) vaccine.** / Consult your health care provider.  Pneumococcal polysaccharide (PPSV23) vaccine.** / 1 to 2 doses if you smoke cigarettes or if you have certain conditions.  Meningococcal vaccine.** /  Consult your health care provider.  Hepatitis A vaccine.** / Consult your health care provider.  Hepatitis B vaccine.** / Consult your health care provider.  Haemophilus influenzae type b (Hib) vaccine.** / Consult your health care provider. Ages 80 years and over  Blood pressure check.** / Every year.  Lipid and cholesterol check.** / Every 5 years beginning at age 62 years.  Lung cancer  screening. / Every year if you are aged 32-80 years and have a 30-pack-year history of smoking and currently smoke or have quit within the past 15 years. Yearly screening is stopped once you have quit smoking for at least 15 years or develop a health problem that would prevent you from having lung cancer treatment.  Clinical breast exam.** / Every year after age 61 years.  BRCA-related cancer risk assessment.** / For women who have family members with a BRCA-related cancer (breast, ovarian, tubal, or peritoneal cancers).  Mammogram.** / Every year beginning at age 39 years and continuing for as long as you are in good health. Consult with your health care provider.  Pap test.** / Every 3 years starting at age 85 years through age 74 or 72 years with 3 consecutive normal Pap tests. Testing can be stopped between 65 and 70 years with 3 consecutive normal Pap tests and no abnormal Pap or HPV tests in the past 10 years.  HPV screening.** / Every 3 years from ages 55 years through ages 67 or 77 years with a history of 3 consecutive normal Pap tests. Testing can be stopped between 65 and 70 years with 3 consecutive normal Pap tests and no abnormal Pap or HPV tests in the past 10 years.  Fecal occult blood test (FOBT) of stool. / Every year beginning at age 81 years and continuing until age 22 years. You may not need to do this test if you get a colonoscopy every 10 years.  Flexible sigmoidoscopy or colonoscopy.** / Every 5 years for a flexible sigmoidoscopy or every 10 years for a colonoscopy beginning at age 67 years and continuing until age 22 years.  Hepatitis C blood test.** / For all people born from [birth year] through 1965 and any individual with known risks for hepatitis C.  Osteoporosis screening.** / A one-time screening for women ages 8 years and over and women at risk for fractures or osteoporosis.  Skin self-exam. / Monthly.  Influenza vaccine. / Every year.  Tetanus, diphtheria, and  acellular pertussis (Tdap/Td) vaccine.** / 1 dose of Td every 10 years.  Varicella vaccine.** / Consult your health care provider.  Zoster vaccine.** / 1 dose for adults aged 56 years or older.  Pneumococcal 13-valent conjugate (PCV13) vaccine.** / Consult your health care provider.  Pneumococcal polysaccharide (PPSV23) vaccine.** / 1 dose for all adults aged 15 years and older.  Meningococcal vaccine.** / Consult your health care provider.  Hepatitis A vaccine.** / Consult your health care provider.  Hepatitis B vaccine.** / Consult your health care provider.  Haemophilus influenzae type b (Hib) vaccine.** / Consult your health care provider. ** Family history and personal history of risk and conditions may change your health care provider's recommendations.   This information is not intended to replace advice given to you by your health care provider. Make sure you discuss any questions you have with your health care provider.   Document Released: 04/05/2001 Document Revised: 02/28/2014 Document Reviewed: 07/05/2010 Elsevier Interactive Patient Education [employer].

## 2015-11-16 ENCOUNTER — Encounter: Payer: Self-pay | Admitting: Obstetrics and Gynecology

## 2015-11-17 ENCOUNTER — Other Ambulatory Visit: Payer: Self-pay | Admitting: Obstetrics and Gynecology

## 2015-11-17 MED ORDER — DROSPIRENONE-ETHINYL ESTRADIOL 3-0.02 MG PO TABS: 1.0000 | ORAL_TABLET | Freq: Every day | ORAL | 11 refills | Status: DC

## 2016-10-14 ENCOUNTER — Emergency Department
Admission: EM | Admit: 2016-10-14 | Discharge: 2016-10-14 | Disposition: A | Discharge status: Home / Self Care | Payer: 59 | Attending: Emergency Medicine | Admitting: Emergency Medicine

## 2016-10-14 ENCOUNTER — Emergency Department: Payer: 59

## 2016-10-14 DIAGNOSIS — S3992XA Unspecified injury of lower back, initial encounter: Secondary | ICD-10-CM | POA: Diagnosis present

## 2016-10-14 DIAGNOSIS — Y990 Civilian activity done for income or pay: Secondary | ICD-10-CM | POA: Diagnosis not present

## 2016-10-14 DIAGNOSIS — Y9301 Activity, walking, marching and hiking: Secondary | ICD-10-CM | POA: Insufficient documentation

## 2016-10-14 DIAGNOSIS — G8911 Acute pain due to trauma: Secondary | ICD-10-CM | POA: Insufficient documentation

## 2016-10-14 DIAGNOSIS — Y929 Unspecified place or not applicable: Secondary | ICD-10-CM | POA: Diagnosis not present

## 2016-10-14 DIAGNOSIS — W102XXA Fall (on)(from) incline, initial encounter: Secondary | ICD-10-CM | POA: Diagnosis not present

## 2016-10-14 DIAGNOSIS — Z79899 Other long term (current) drug therapy: Secondary | ICD-10-CM | POA: Diagnosis not present

## 2016-10-14 LAB — POCT PREGNANCY, URINE: PREG TEST UR: NEGATIVE

## 2016-10-14 MED ORDER — HYDROCODONE-ACETAMINOPHEN 5-325 MG PO TABS: 1.0000 | ORAL_TABLET | ORAL | 0 refills | Status: DC | PRN

## 2016-10-14 MED ORDER — CYCLOBENZAPRINE HCL 10 MG PO TABS: 10.0000 mg | ORAL_TABLET | Freq: Three times a day (TID) | ORAL | 0 refills | Status: DC | PRN

## 2016-10-14 MED ORDER — CYCLOBENZAPRINE HCL 10 MG PO TABS
10.0000 mg | ORAL_TABLET | Freq: Once | ORAL | Status: AC
  Administered 2016-10-14: 10 mg via ORAL
  Filled 2016-10-14: qty 1

## 2016-10-14 MED ORDER — HYDROCODONE-ACETAMINOPHEN 5-325 MG PO TABS
1.0000 | ORAL_TABLET | Freq: Once | ORAL | Status: AC
  Administered 2016-10-14: 1 via ORAL
  Filled 2016-10-14: qty 1

## 2016-10-14 NOTE — ED Notes (Signed)
Pt given specimen cup for POCt preg testing for xray
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
## 2016-10-14 NOTE — ED Triage Notes (Signed)
Pt arrives to ED via POV with c/o bilateral lower back/tailbone pain s/p slip and fall that happened last night at work. Pt denies head injury or LOC, no loss of bowel or bladder control. Pt does not wish to file WC at this time. Pt is A&O, in NAD; RR even, regular, and unlabored. Skin color/temp is WNL.

## 2016-10-14 NOTE — ED Notes (Signed)

## 2016-10-14 NOTE — Discharge Instructions (Signed)
Take a stool softener if taking the Hydrocodone pain medication. Follow up with the primary care provider of your choice for symptoms that are not improving over the next week or so. Return to the ER for symptoms that change or worsen if unable to schedule an appointment.

## 2017-02-21 NOTE — L&D Delivery Note (Signed)
Delivery Note:  0115 Called in room to see patient, reports pelvic pressure and urge to push. SVE: 10/100/+3, vertex.   Spontaneous vaginal birth of liveborn female infant at 75 in right occiput anterior position. Infant immediately to maternal abdomen. Delayed cord clamping. Three (3) vessel cord. Cord blood collected. APGARS: 8, 9. Weight: 3820 grams or 8 pounds 7 ounces. Receiving nurse present at bedside for birth.   Pitocin infusing. Spontaneous delivery of intact placenta at 0133. Right labial laceration repaired with 3-0 vicryl rapide under epidural anesthesia. QBL: 460 ml. Uterus firm. Lochia: small. Counts correct x 2. Vault check completed. 1000 mcg cytotec placed rectal due to history of delayed postpartum hemorrhage. Reviewed red flag symptoms and when to call.   Initiate routine postpartum care and orders. Mom to postpartum.  Baby to Couplet care / Skin to Skin.  FOB, maternal grandmother, and paternal grandmother present at bedside and overjoyed with the birth of "Sharlyn Bologna". Infant gestation [redacted]w[redacted]d.    Diona Fanti, CNM [facility], [facility] 09/04/2017, 1:52 AM

## 2017-02-23 ENCOUNTER — Ambulatory Visit: Payer: 59 | Admitting: Certified Nurse Midwife

## 2017-02-23 ENCOUNTER — Encounter: Payer: Self-pay | Admitting: Certified Nurse Midwife

## 2017-02-23 VITALS — BP 117/67 | HR 97 | Ht 65.0 in | Wt 201.8 lb

## 2017-02-23 DIAGNOSIS — N926 Irregular menstruation, unspecified: Secondary | ICD-10-CM | POA: Diagnosis not present

## 2017-02-23 DIAGNOSIS — O219 Vomiting of pregnancy, unspecified: Secondary | ICD-10-CM | POA: Diagnosis not present

## 2017-02-23 DIAGNOSIS — R109 Unspecified abdominal pain: Secondary | ICD-10-CM

## 2017-02-23 DIAGNOSIS — O26899 Other specified pregnancy related conditions, unspecified trimester: Secondary | ICD-10-CM

## 2017-02-23 LAB — POCT URINE PREGNANCY: Preg Test, Ur: POSITIVE — AB

## 2017-02-23 MED ORDER — DOXYLAMINE-PYRIDOXINE 10-10 MG PO TBEC: 2.0000 | DELAYED_RELEASE_TABLET | Freq: Every day | ORAL | 5 refills | Status: DC
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
## 2017-02-23 NOTE — Patient Instructions (Signed)
Eating Plan for Pregnant Women While you are pregnant, your body will require additional nutrition to help support your growing baby. It is recommended that you consume:  150 additional calories each day during your first trimester.  300 additional calories each day during your second trimester.  300 additional calories each day during your third trimester.  Eating a healthy, well-balanced diet is very important for your health and for your baby's health. You also have a higher need for some vitamins and minerals, such as folic acid, calcium, iron, and vitamin D. What do I need to know about eating during pregnancy?  Do not try to lose weight or go on a diet during pregnancy.  Choose healthy, nutritious foods. Choose  of a sandwich with a glass of milk instead of a candy bar or a high-calorie sugar-sweetened beverage.  Limit your overall intake of foods that have "empty calories." These are foods that have little nutritional value, such as sweets, desserts, candies, sugar-sweetened beverages, and fried foods.  Eat a variety of foods, especially fruits and vegetables.  Take a prenatal vitamin to help meet the additional needs during pregnancy, specifically for folic acid, iron, calcium, and vitamin D.  Remember to stay active. Ask your health care provider for exercise recommendations that are specific to you.  Practice good food safety and cleanliness, such as washing your hands before you eat and after you prepare raw meat. This helps to prevent foodborne illnesses, such as listeriosis, that can be very dangerous for your baby. Ask your health care provider for more information about listeriosis. What does 150 extra calories look like? Healthy options for an additional 150 calories each day could be any of the following:  Plain low-fat yogurt (6-8 oz) with  cup of berries.  1 apple with 2 teaspoons of peanut butter.  Cut-up vegetables with  cup of hummus.  Low-fat chocolate milk  (8 oz or 1 cup).  1 string cheese with 1 medium orange.   of a peanut butter and jelly sandwich on whole-wheat bread (1 tsp of peanut butter).  For 300 calories, you could eat two of those healthy options each day. What is a healthy amount of weight to gain? The recommended amount of weight for you to gain is based on your pre-pregnancy BMI. If your pre-pregnancy BMI was:  Less than 18 (underweight), you should gain 28-40 lb.  18-24.9 (normal), you should gain 25-35 lb.  25-29.9 (overweight), you should gain 15-25 lb.  Greater than 30 (obese), you should gain 11-20 lb.  What if I am having twins or multiples? Generally, pregnant women who will be having twins or multiples may need to increase their daily calories by 300-600 calories each day. The recommended range for total weight gain is 25-54 lb, depending on your pre-pregnancy BMI. Talk with your health care provider for specific guidance about additional nutritional needs, weight gain, and exercise during your pregnancy. What foods can I eat? Grains Any grains. Try to choose whole grains, such as whole-wheat bread, oatmeal, or brown rice. Vegetables Any vegetables. Try to eat a variety of colors and types of vegetables to get a full range of vitamins and minerals. Remember to wash your vegetables well before eating. Fruits Any fruits. Try to eat a variety of colors and types of fruit to get a full range of vitamins and minerals. Remember to wash your fruits well before eating. Meats and Other Protein Sources Lean meats, including chicken, [place], fish, and lean cuts of beef, veal,  or pork. Make sure that all meats are cooked to "well done." Tofu. Tempeh. Beans. Eggs. Peanut butter and other nut butters. Seafood, such as shrimp, crab, and lobster. If you choose fish, select types that are higher in omega-3 fatty acids, including salmon, herring, mussels, trout, sardines, and pollock. Make sure that all meats are cooked to food-safe  temperatures. Dairy Pasteurized milk and milk alternatives. Pasteurized yogurt and pasteurized cheese. Cottage cheese. Sour cream. Beverages Water. Juices that contain 100% fruit juice or vegetable juice. Caffeine-free teas and decaffeinated coffee. Drinks that contain caffeine are okay to drink, but it is better to avoid caffeine. Keep your total caffeine intake to less than 200 mg each day (12 oz of coffee, tea, or soda) or as directed by your health care provider. Condiments Any pasteurized condiments. Sweets and Desserts Any sweets and desserts. Fats and Oils Any fats and oils. The items listed above may not be a complete list of recommended foods or beverages. Contact your dietitian for more options. What foods are not recommended? Vegetables Unpasteurized (raw) vegetable juices. Fruits Unpasteurized (raw) fruit juices. Meats and Other Protein Sources Cured meats that have nitrates, such as bacon, salami, and hotdogs. Luncheon meats, bologna, or other deli meats (unless they are reheated until they are steaming hot). Refrigerated pate, meat spreads from a meat counter, smoked seafood that is found in the refrigerated section of a store. Raw fish, such as sushi or sashimi. High mercury content fish, such as tilefish, shark, swordfish, and king mackerel. Raw meats, such as tuna or beef tartare. Undercooked meats and poultry. Make sure that all meats are cooked to food-safe temperatures. Dairy Unpasteurized (raw) milk and any foods that have raw milk in them. Soft cheeses, such as feta, queso blanco, queso fresco, Brie, Camembert cheeses, blue-veined cheeses, and Panela cheese (unless it is made with pasteurized milk, which must be stated on the label). Beverages Alcohol. Sugar-sweetened beverages, such as sodas, teas, or energy drinks. Condiments Homemade fermented foods and drinks, such as pickles, sauerkraut, or kombucha drinks. (Store-bought pasteurized versions of these are  okay.) Other Salads that are made in the store, such as ham salad, chicken salad, egg salad, tuna salad, and seafood salad. The items listed above may not be a complete list of foods and beverages to avoid. Contact your dietitian for more information. This information is not intended to replace advice given to you by your health care provider. Make sure you discuss any questions you have with your health care provider. Document Released: 11/22/2013 Document Revised: 07/16/2015 Document Reviewed: 07/23/2013 Elsevier Interactive Patient Education  2018 [employer]. Abdominal Pain During Pregnancy Belly (abdominal) pain is common during pregnancy. Most of the time, it is not a serious problem. Other times, it can be a sign that something is wrong with the pregnancy. Always tell your doctor if you have belly pain. Follow these instructions at home: Monitor your belly pain for any changes. The following actions may help you feel better:  Do not have sex (intercourse) or put anything in your vagina until you feel better.  Rest until your pain stops.  Drink clear fluids if you feel sick to your stomach (nauseous). Do not eat solid food until you feel better.  Only take medicine as told by your doctor.  Keep all doctor visits as told.  Get help right away if:  You are bleeding, leaking fluid, or pieces of tissue come out of your vagina.  You have more pain or cramping.  You keep  throwing up (vomiting).  You have pain when you pee (urinate) or have blood in your pee.  You have a fever.  You do not feel your baby moving as much.  You feel very weak or feel like passing out.  You have trouble breathing, with or without belly pain.  You have a very bad headache and belly pain.  You have fluid leaking from your vagina and belly pain.  You keep having watery poop (diarrhea).  Your belly pain does not go away after resting, or the pain gets worse. This information is not intended to  replace advice given to you by your health care provider. Make sure you discuss any questions you have with your health care provider. Document Released: 01/26/2009 Document Revised: 09/16/2015 Document Reviewed: 09/06/2012 Elsevier Interactive Patient Education  2018 [place]. Back Pain in Pregnancy Back pain during pregnancy is common. Back pain may be caused by several factors that are related to changes during your pregnancy. Follow these instructions at home: Managing pain, stiffness, and swelling  If directed, apply ice for sudden (acute) back pain. ? Put ice in a plastic bag. ? Place a towel between your skin and the bag. ? Leave the ice on for 20 minutes, 2-3 times per day.  If directed, apply heat to the affected area before you exercise: ? Place a towel between your skin and the heat pack or heating pad. ? Leave the heat on for 20-30 minutes. ? Remove the heat if your skin turns bright red. This is especially important if you are unable to feel pain, heat, or cold. You may have a greater risk of getting burned. Activity  Exercise as told by your health care provider. Exercising is the best way to prevent or manage back pain.  Listen to your body when lifting. If lifting hurts, ask for help or bend your knees. This uses your leg muscles instead of your back muscles.  Squat down when picking up something from the floor. Do not bend over.  Only use bed rest as told by your health care provider. Bed rest should only be used for the most severe episodes of back pain. Standing, Sitting, and Lying Down  Do not stand in one place for long periods of time.  Use good posture when sitting. Make sure your head rests over your shoulders and is not hanging forward. Use a pillow on your lower back if necessary.  Try sleeping on your side, preferably the left side, with a pillow or two between your legs. If you are sore after a night's rest, your bed may be too soft. A firm mattress  may provide more support for your back during pregnancy. General instructions  Do not wear high heels.  Eat a healthy diet. Try to gain weight within your health care provider's recommendations.  Use a maternity girdle, elastic sling, or back brace as told by your health care provider.  Take over-the-counter and prescription medicines only as told by your health care provider.  Keep all follow-up visits as told by your health care provider. This is important. This includes any visits with any specialists, such as a physical therapist. Contact a health care provider if:  Your back pain interferes with your daily activities.  You have increasing pain in other parts of your body. Get help right away if:  You develop numbness, tingling, weakness, or problems with the use of your arms or legs.  You develop severe back pain that is not controlled  with medicine.  You have a sudden change in bowel or bladder control.  You develop shortness of breath, dizziness, or you faint.  You develop nausea, vomiting, or sweating.  You have back pain that is a rhythmic, cramping pain similar to labor pains. Labor pain is usually 1-2 minutes apart, lasts for about 1 minute, and involves a bearing down feeling or pressure in your pelvis.  You have back pain and your water breaks or you have vaginal bleeding.  You have back pain or numbness that travels down your leg.  Your back pain developed after you fell.  You develop pain on one side of your back.  You see blood in your urine.  You develop skin blisters in the area of your back pain. This information is not intended to replace advice given to you by your health care provider. Make sure you discuss any questions you have with your health care provider. Document Released: 05/18/2005 Document Revised: 07/16/2015 Document Reviewed: 10/22/2014 Elsevier Interactive Patient Education  2018 [employer]. Common Medications Safe in  Pregnancy  Acne:      Constipation:  Benzoyl Peroxide     Colace  Clindamycin      Dulcolax Suppository  Topica Erythromycin     Fibercon  Salicylic Acid      Metamucil         Miralax AVOID:        Senakot   Accutane    Cough:  Retin-A       Cough Drops  Tetracycline      Phenergan w/ Codeine if Rx  Minocycline      Robitussin (Plain & DM)  Antibiotics:     Crabs/Lice:  Ceclor       RID  Cephalosporins    AVOID:  E-Mycins      Kwell  Keflex  Macrobid/Macrodantin   Diarrhea:  Penicillin      Kao-Pectate  Zithromax      Imodium AD         PUSH FLUIDS AVOID:       Cipro     Fever:  Tetracycline      Tylenol (Regular or Extra  Minocycline       Strength)  Levaquin      Extra Strength-Do not          Exceed 8 tabs/24 hrs Caffeine:        '200mg'$ /day (equiv. To 1 cup of coffee or  approx. 3 12 oz sodas)         Gas: Cold/Hayfever:       Gas-X  Benadryl      Mylicon  Claritin       Phazyme  **Claritin-D        Chlor-Trimeton    Headaches:  Dimetapp      ASA-Free Excedrin  Drixoral-Non-Drowsy     Cold Compress  Mucinex (Guaifenasin)     Tylenol (Regular or Extra  Sudafed/Sudafed-12 Hour     Strength)  **Sudafed PE Pseudoephedrine   Tylenol Cold & Sinus     Vicks Vapor Rub  Zyrtec  **AVOID if Problems With Blood Pressure         Heartburn: Avoid lying down for at least 1 hour after meals  Aciphex      Maalox     Rash:  Milk of Magnesia     Benadryl    Mylanta       1% Hydrocortisone Cream  Pepcid  Pepcid Complete   Sleep Aids:  Prevacid  Ambien   Prilosec       Benadryl  Rolaids       Chamomile Tea  Tums (Limit 4/day)     Unisom  Zantac       Tylenol PM         Warm milk-add vanilla or  Hemorrhoids:       Sugar for taste  Anusol/Anusol H.C.  (RX: Analapram 2.5%)  Sugar Substitutes:  Hydrocortisone OTC     Ok in moderation  Preparation H      Tucks        Vaseline lotion applied to tissue with  wiping    Herpes:     Throat:  Acyclovir      Oragel  Famvir  Valtrex     Vaccines:         Flu Shot Leg Cramps:       *Gardasil  Benadryl      Hepatitis A         Hepatitis B Nasal Spray:       Pneumovax  Saline Nasal Spray     Polio Booster         Tetanus Nausea:       Tuberculosis test or PPD  Vitamin B6 25 mg TID   AVOID:    Dramamine      *Gardasil  Emetrol       Live Poliovirus  Ginger Root 250 mg QID    MMR (measles, mumps &  High Complex Carbs @ Bedtime    rebella)  Sea Bands-Accupressure    Varicella (Chickenpox)  Unisom 1/2 tab TID     *No known complications           If received before Pain:         Known pregnancy;   Darvocet       Resume series after  Lortab        Delivery  Percocet    Yeast:   Tramadol      Femstat  Tylenol 3      Gyne-lotrimin  Ultram       Monistat  Vicodin           MISC:         All Sunscreens           Hair Coloring/highlights          Insect Repellant's          (Including DEET)         Mystic Tans First Trimester of Pregnancy The first trimester of pregnancy is from week 1 until the end of week 13 (months 1 through 3). During this time, your baby will begin to develop inside you. At 6-8 weeks, the eyes and face are formed, and the heartbeat can be seen on ultrasound. At the end of 12 weeks, all the baby's organs are formed. Prenatal care is all the medical care you receive before the birth of your baby. Make sure you get good prenatal care and follow all of your doctor's instructions. Follow these instructions at home: Medicines  Take over-the-counter and prescription medicines only as told by your doctor. Some medicines are safe and some medicines are not safe during pregnancy.  Take a prenatal vitamin that contains at least 600 micrograms (mcg) of folic acid.  If you have trouble pooping (constipation), take medicine that will make your stool soft (stool softener) if your doctor approves. Eating and drinking  Eat regular,  healthy meals.  Your doctor will tell  you the amount of weight gain that is right for you.  Avoid raw meat and uncooked cheese.  If you feel sick to your stomach (nauseous) or throw up (vomit): ? Eat 4 or 5 small meals a day instead of 3 large meals. ? Try eating a few soda crackers. ? Drink liquids between meals instead of during meals.  To prevent constipation: ? Eat foods that are high in fiber, like fresh fruits and vegetables, whole grains, and beans. ? Drink enough fluids to keep your pee (urine) clear or pale yellow. Activity  Exercise only as told by your doctor. Stop exercising if you have cramps or pain in your lower belly (abdomen) or low back.  Do not exercise if it is too hot, too humid, or if you are in a place of great height (high altitude).  Try to avoid standing for long periods of time. Move your legs often if you must stand in one place for a long time.  Avoid heavy lifting.  Wear low-heeled shoes. Sit and stand up straight.  You can have sex unless your doctor tells you not to. Relieving pain and discomfort  Wear a good support bra if your breasts are sore.  Take warm water baths (sitz baths) to soothe pain or discomfort caused by hemorrhoids. Use hemorrhoid cream if your doctor says it is okay.  Rest with your legs raised if you have leg cramps or low back pain.  If you have puffy, bulging veins (varicose veins) in your legs: ? Wear support hose or compression stockings as told by your doctor. ? Raise (elevate) your feet for 15 minutes, 3-4 times a day. ? Limit salt in your food. Prenatal care  Schedule your prenatal visits by the twelfth week of pregnancy.  Write down your questions. Take them to your prenatal visits.  Keep all your prenatal visits as told by your doctor. This is important. Safety  Wear your seat belt at all times when driving.  Make a list of emergency phone numbers. The list should include numbers for family, friends, the  hospital, and police and fire departments. General instructions  Ask your doctor for a referral to a local prenatal class. Begin classes no later than at the start of month 6 of your pregnancy.  Ask for help if you need counseling or if you need help with nutrition. Your doctor can give you advice or tell you where to go for help.  Do not use hot tubs, steam rooms, or saunas.  Do not douche or use tampons or scented sanitary pads.  Do not cross your legs for long periods of time.  Avoid all herbs and alcohol. Avoid drugs that are not approved by your doctor.  Do not use any tobacco products, including cigarettes, chewing tobacco, and electronic cigarettes. If you need help quitting, ask your doctor. You may get counseling or other support to help you quit.  Avoid cat litter boxes and soil used by cats. These carry germs that can cause birth defects in the baby and can cause a loss of your baby (miscarriage) or stillbirth.  Visit your dentist. At home, brush your teeth with a soft toothbrush. Be gentle when you floss. Contact a doctor if:  You are dizzy.  You have mild cramps or pressure in your lower belly.  You have a nagging pain in your belly area.  You continue to feel sick to your stomach, you throw up, or you have watery poop (diarrhea).  You have  a bad smelling fluid coming from your vagina.  You have pain when you pee (urinate).  You have increased puffiness (swelling) in your face, hands, legs, or ankles. Get help right away if:  You have a fever.  You are leaking fluid from your vagina.  You have spotting or bleeding from your vagina.  You have very bad belly cramping or pain.  You gain or lose weight rapidly.  You throw up blood. It may look like coffee grounds.  You are around people who have [place] measles, fifth disease, or chickenpox.  You have a very bad headache.  You have shortness of breath.  You have any kind of trauma, such as from a fall or  a car accident. Summary  The first trimester of pregnancy is from week 1 until the end of week 13 (months 1 through 3).  To take care of yourself and your unborn baby, you will need to eat healthy meals, take medicines only if your doctor tells you to do so, and do activities that are safe for you and your baby.  Keep all follow-up visits as told by your doctor. This is important as your doctor will have to ensure that your baby is healthy and growing well. This information is not intended to replace advice given to you by your health care provider. Make sure you discuss any questions you have with your health care provider. Document Released: 07/27/2007 Document Revised: 02/16/2016 Document Reviewed: 02/16/2016 Elsevier Interactive Patient Education  2017 [employer].

## 2017-02-23 NOTE — Progress Notes (Signed)
Pregnancy confirmation. Patient had a positive home pregnancy test about 3 weeks ago. LMP was 12/18/16 (approx). She also has nausea, but has not vomited.

## 2017-02-23 NOTE — Addendum Note (Signed)
Addended by: Cherre Huger on: 02/23/2017 02:15 PM   Modules accepted: Orders

## 2017-03-02 ENCOUNTER — Ambulatory Visit (INDEPENDENT_AMBULATORY_CARE_PROVIDER_SITE_OTHER): Payer: 59

## 2017-03-02 DIAGNOSIS — O26899 Other specified pregnancy related conditions, unspecified trimester: Secondary | ICD-10-CM | POA: Diagnosis not present

## 2017-03-02 DIAGNOSIS — N926 Irregular menstruation, unspecified: Secondary | ICD-10-CM | POA: Diagnosis not present

## 2017-03-02 DIAGNOSIS — R109 Unspecified abdominal pain: Secondary | ICD-10-CM

## 2017-03-03 ENCOUNTER — Other Ambulatory Visit: Payer: Self-pay

## 2017-03-03 ENCOUNTER — Ambulatory Visit (INDEPENDENT_AMBULATORY_CARE_PROVIDER_SITE_OTHER): Payer: 59 | Admitting: Obstetrics and Gynecology

## 2017-03-03 VITALS — BP 91/66 | HR 94 | Wt 201.1 lb

## 2017-03-03 DIAGNOSIS — Z3482 Encounter for supervision of other normal pregnancy, second trimester: Secondary | ICD-10-CM

## 2017-03-03 NOTE — Progress Notes (Signed)
Sarah Haney presents for NOB nurse interview visit. Pregnancy confirmation done 02/23/17 [facility]______.  G- 2.  P- 1   . Pregnancy education material explained and given. _0__ cats in the home. NOB labs ordered. (TSH/HbgA1c due to Increased BMI), (sickle cell). HIV labs and Drug screen were explained optional and she did not decline. Drug screen ordered. PNV encouraged. Genetic screening options discussed. Genetic testing: Ordered/Declined/Unsure.  Pt may discuss with provider. Pt. To follow up with provider in 2__ weeks for NOB physical.History of blood transfusion x2 last delivery.  All questions answered.

## 2017-03-04 LAB — URINE CULTURE

## 2017-03-04 LAB — URINALYSIS, ROUTINE W REFLEX MICROSCOPIC
Bilirubin, UA: NEGATIVE
Glucose, UA: NEGATIVE
Ketones, UA: NEGATIVE
Nitrite, UA: POSITIVE — AB
PH UA: 5.5 (ref 5.0–7.5)
RBC UA: NEGATIVE
Specific Gravity, UA: 1.028 (ref 1.005–1.030)
Urobilinogen, Ur: 0.2 mg/dL (ref 0.2–1.0)

## 2017-03-04 LAB — MICROSCOPIC EXAMINATION

## 2017-03-05 LAB — GC/CHLAMYDIA PROBE AMP
CHLAMYDIA, DNA PROBE: NEGATIVE
Neisseria gonorrhoeae by PCR: NEGATIVE

## 2017-03-06 LAB — MONITOR DRUG PROFILE 14(MW)
Amphetamine Scrn, Ur: NEGATIVE ng/mL
BARBITURATE SCREEN URINE: NEGATIVE ng/mL
BENZODIAZEPINE SCREEN, URINE: NEGATIVE ng/mL
BUPRENORPHINE, URINE: NEGATIVE ng/mL
CANNABINOIDS UR QL SCN: NEGATIVE ng/mL
COCAINE(METAB.)SCREEN, URINE: NEGATIVE ng/mL
CREATININE(CRT), U: 277.2 mg/dL (ref 20.0–300.0)
Fentanyl, Urine: NEGATIVE pg/mL
MEPERIDINE SCREEN, URINE: NEGATIVE ng/mL
METHADONE SCREEN, URINE: NEGATIVE ng/mL
OXYCODONE+OXYMORPHONE UR QL SCN: NEGATIVE ng/mL
Opiate Scrn, Ur: NEGATIVE ng/mL
PHENCYCLIDINE QUANTITATIVE URINE: NEGATIVE ng/mL
Ph of Urine: 5.5 (ref 4.5–8.9)
Propoxyphene Scrn, Ur: NEGATIVE ng/mL
SPECIFIC GRAVITY: 1.025
TRAMADOL SCREEN, URINE: NEGATIVE ng/mL

## 2017-03-06 LAB — CBC WITH DIFFERENTIAL
BASOS: 0 %
Basophils Absolute: 0 10*3/uL (ref 0.0–0.2)
EOS (ABSOLUTE): 0.2 10*3/uL (ref 0.0–0.4)
EOS: 3 %
HEMATOCRIT: 39.6 % (ref 34.0–46.6)
HEMOGLOBIN: 13.6 g/dL (ref 11.1–15.9)
Immature Grans (Abs): 0 10*3/uL (ref 0.0–0.1)
Immature Granulocytes: 0 %
LYMPHS ABS: 2.1 10*3/uL (ref 0.7–3.1)
Lymphs: 25 %
MCH: 30.6 pg (ref 26.6–33.0)
MCHC: 34.3 g/dL (ref 31.5–35.7)
MCV: 89 fL (ref 79–97)
MONOCYTES: 5 %
Monocytes Absolute: 0.4 10*3/uL (ref 0.1–0.9)
NEUTROS ABS: 5.7 10*3/uL (ref 1.4–7.0)
Neutrophils: 67 %
RBC: 4.44 x10E6/uL (ref 3.77–5.28)
RDW: 13.3 % (ref 12.3–15.4)
WBC: 8.5 10*3/uL (ref 3.4–10.8)

## 2017-03-06 LAB — NICOTINE SCREEN, URINE: Cotinine Ql Scrn, Ur: NEGATIVE ng/mL

## 2017-03-06 LAB — ANTIBODY SCREEN

## 2017-03-06 LAB — HEMOGLOBIN A1C
ESTIMATED AVERAGE GLUCOSE: 97 mg/dL
HEMOGLOBIN A1C: 5 % (ref 4.8–5.6)

## 2017-03-06 LAB — HEPATITIS B SURFACE ANTIGEN: Hepatitis B Surface Ag: NEGATIVE

## 2017-03-06 LAB — AB SCR+ANTIBODY ID
Antibody Screen: POSITIVE — AB
Coombs Titer #2: 4

## 2017-03-06 LAB — RUBELLA SCREEN: RUBELLA: 3.98 {index} (ref >0.99)

## 2017-03-06 LAB — HIV ANTIBODY (ROUTINE TESTING W REFLEX): HIV Screen 4th Generation wRfx: NONREACTIVE

## 2017-03-06 LAB — RPR: RPR Ser Ql: NONREACTIVE

## 2017-03-06 LAB — VARICELLA ZOSTER ANTIBODY, IGG: Varicella zoster IgG: 503 index (ref >165)

## 2017-03-06 LAB — TSH: TSH: 3.07 u[IU]/mL (ref 0.450–4.500)

## 2017-03-06 LAB — ABO AND RH: RH TYPE: NEGATIVE

## 2017-03-08 ENCOUNTER — Ambulatory Visit (INDEPENDENT_AMBULATORY_CARE_PROVIDER_SITE_OTHER): Payer: 59 | Admitting: Certified Nurse Midwife

## 2017-03-08 ENCOUNTER — Ambulatory Visit: Payer: 59 | Admitting: Certified Nurse Midwife

## 2017-03-08 VITALS — BP 119/73 | HR 133 | Wt 200.6 lb

## 2017-03-08 DIAGNOSIS — Z3492 Encounter for supervision of normal pregnancy, unspecified, second trimester: Secondary | ICD-10-CM

## 2017-03-08 LAB — POCT URINALYSIS DIPSTICK
Bilirubin, UA: NEGATIVE
GLUCOSE UA: NEGATIVE
KETONES UA: 15
NITRITE UA: NEGATIVE
RBC UA: NEGATIVE
SPEC GRAV UA: 1.015 (ref 1.010–1.025)
Urobilinogen, UA: 0.2 E.U./dL
pH, UA: 6 (ref 5.0–8.0)

## 2017-03-08 NOTE — Progress Notes (Signed)
NOB physical- pt is doing well

## 2017-03-08 NOTE — Patient Instructions (Signed)
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
## 2017-03-08 NOTE — Progress Notes (Signed)
error 

## 2017-03-09 LAB — MATERNIT 21 PLUS CORE, BLOOD
CHROMOSOME 13: NEGATIVE
CHROMOSOME 21: NEGATIVE
Chromosome 18: NEGATIVE
Y CHROMOSOME: NOT DETECTED

## 2017-03-10 NOTE — Patient Instructions (Signed)

## 2017-03-17 ENCOUNTER — Encounter: Payer: Self-pay | Admitting: Certified Nurse Midwife

## 2017-03-17 ENCOUNTER — Other Ambulatory Visit: Payer: Self-pay | Admitting: Certified Nurse Midwife

## 2017-03-17 LAB — NUSWAB VG, CANDIDA 6SP
Candida albicans, NAA: POSITIVE — AB
Candida glabrata, NAA: NEGATIVE
Candida krusei, NAA: NEGATIVE
Candida lusitaniae, NAA: NEGATIVE
Candida parapsilosis, NAA: NEGATIVE
Candida tropicalis, NAA: NEGATIVE
Trich vag by NAA: NEGATIVE

## 2017-03-17 MED ORDER — FLUCONAZOLE 150 MG PO TABS: 150.0000 mg | ORAL_TABLET | Freq: Once | ORAL | 0 refills | Status: AC

## 2017-03-17 NOTE — Progress Notes (Signed)
Nuswab positive for yeast. Pt notified via my chart. Diflucan ordered.   Philip Aspen, CNM

## 2017-04-04 ENCOUNTER — Other Ambulatory Visit: Payer: 59

## 2017-04-04 ENCOUNTER — Encounter: Payer: 59 | Admitting: Certified Nurse Midwife

## 2017-04-05 ENCOUNTER — Ambulatory Visit (INDEPENDENT_AMBULATORY_CARE_PROVIDER_SITE_OTHER): Payer: 59 | Admitting: Certified Nurse Midwife

## 2017-04-05 VITALS — BP 104/66 | HR 95 | Wt 196.1 lb

## 2017-04-05 DIAGNOSIS — Z3492 Encounter for supervision of normal pregnancy, unspecified, second trimester: Secondary | ICD-10-CM

## 2017-04-05 LAB — POCT URINALYSIS DIPSTICK
BILIRUBIN UA: NEGATIVE
Glucose, UA: NEGATIVE
Leukocytes, UA: NEGATIVE
NITRITE UA: NEGATIVE
PH UA: 6.5 (ref 5.0–8.0)
PROTEIN UA: NEGATIVE
RBC UA: NEGATIVE
Spec Grav, UA: 1.015 (ref 1.010–1.025)
Urobilinogen, UA: 0.2 E.U./dL

## 2017-04-05 NOTE — Progress Notes (Signed)
ROB, doing well. Pt states that she had separation of her pelvic bone with last pregnancy that caused her a lot of pain. She states that she is starting to have a little of the same pain. Discussed use of physical therapy if worsens. She verbalizes understanding . Discussed anatomy scan in 2 wks. Verbalizes understanding and agrees to plan. Discussed pap sear result not found and that it may not have been done because recommendations are to start @ 21. Pt state  She is pretty shore it was done. Will as Ivin Booty to look.  Follow up 2 wks.   Philip Aspen, CNM

## 2017-04-05 NOTE — Progress Notes (Signed)
Pt is here for an [place] visit. Had the glucose test yesterday and it made her Sarah Haney all day long.

## 2017-04-05 NOTE — Patient Instructions (Signed)

## 2017-04-06 LAB — URINE CULTURE

## 2017-04-07 ENCOUNTER — Other Ambulatory Visit: Payer: 59

## 2017-04-10 ENCOUNTER — Other Ambulatory Visit: Payer: Self-pay | Admitting: Certified Nurse Midwife

## 2017-04-10 DIAGNOSIS — Z3689 Encounter for other specified antenatal screening: Secondary | ICD-10-CM

## 2017-04-19 ENCOUNTER — Ambulatory Visit (INDEPENDENT_AMBULATORY_CARE_PROVIDER_SITE_OTHER): Payer: 59

## 2017-04-19 ENCOUNTER — Ambulatory Visit (INDEPENDENT_AMBULATORY_CARE_PROVIDER_SITE_OTHER): Payer: 59 | Admitting: Certified Nurse Midwife

## 2017-04-19 ENCOUNTER — Encounter: Payer: Self-pay | Admitting: Certified Nurse Midwife

## 2017-04-19 VITALS — BP 110/87 | HR 90 | Wt 194.4 lb

## 2017-04-19 DIAGNOSIS — Z3689 Encounter for other specified antenatal screening: Secondary | ICD-10-CM | POA: Diagnosis not present

## 2017-04-19 DIAGNOSIS — Z3492 Encounter for supervision of normal pregnancy, unspecified, second trimester: Secondary | ICD-10-CM

## 2017-04-19 LAB — POCT URINALYSIS DIPSTICK
Bilirubin, UA: NEGATIVE
Glucose, UA: NEGATIVE
LEUKOCYTES UA: NEGATIVE
Nitrite, UA: NEGATIVE
PH UA: 6 (ref 5.0–8.0)
RBC UA: NEGATIVE
Spec Grav, UA: 1.025 (ref 1.010–1.025)
Urobilinogen, UA: 0.2 E.U./dL

## 2017-04-19 MED ORDER — KETOCONAZOLE 2 % EX CREA: 1.0000 "application " | TOPICAL_CREAM | Freq: Two times a day (BID) | CUTANEOUS | 0 refills | Status: DC | PRN

## 2017-04-19 MED ORDER — KETOCONAZOLE 2 % EX CREA: TOPICAL_CREAM | Freq: Two times a day (BID) | CUTANEOUS | Status: DC | PRN

## 2017-04-19 NOTE — Progress Notes (Signed)
ROB doing well. Complains of rash on her arms. They are scaling sharply marginated plaques . The are some with connentric rings and arcuate lesions. Suspect Tinea Corporis. Order placed for [facility]. Instructions for use reviewed. Discussed result of u/s ( see below) . Dr. Amalia Hailey consulted. PT will return in 2 wks for repeat. PT verbalizes understanding. Follow up 2 wks for u/s 4 wks for ROB.   Philip Aspen, CNM    ULTRASOUND REPORT  Location: [facility] Date of Service:  04/19/2017  Indications: Anatomy Findings:  Nelda Marseille intrauterine pregnancy is visualized with FHR at 137 BPM. Biometrics give an (U/S) Gestational age of 100 3/7 weeks and an (U/S) EDD of 09/03/17; this correlates with the clinically established EDD of 09/02/17.  Fetal presentation is variable.  EFW: 367 grams (0lb 13oz). Placenta: Anterior and grade 1. AFI: WNL subjectively.  Anatomic survey is complete and appears grossly WNL; Gender - Female.  Suboptimal images of fetal profile and heart are demonstrated due to maternal body habitus and fetal position.   Right Ovary measures 3.3 x 2.5 x 1.7 cm. It is normal in appearance. Definitive left ovarian tissue is not visualized on today's exam.  In the area of the left adnexa, inferior to the cervix, a cystic structure is noted measuring 5.3 x 5.4 x 4.9 cm.  This is similar in appearance to previous exam. There is no obvious evidence of a corpus luteal cyst. There is no free peritoneal fluid in the cul de sac.  Impression: 1. 20 3/7 week Viable Singleton Intrauterine pregnancy by U/S. 2. (U/S) EDD is consistent with Clinically established (LMP) EDD of 09/02/17. 3. Grossly normal anatomy scan, however, limited due to maternal body habitus and fetal position. 4. Left adnexa, inferior to cervix, cystic structure measuring 5.3 x 5.4 x 4.9 cm.  Recommendations: 1.Clinical correlation with the patient's History and Physical Exam. 2. Recommend possible F/U with  perinatal for suboptimal views of the fetal heart and profile.  Dario Ave, RDMS

## 2017-04-19 NOTE — Addendum Note (Signed)
Addended by: Hildred Priest on: 04/19/2017 11:40 AM   Modules accepted: Orders

## 2017-04-19 NOTE — Patient Instructions (Signed)

## 2017-04-20 ENCOUNTER — Other Ambulatory Visit: Payer: Self-pay | Admitting: Certified Nurse Midwife

## 2017-04-20 DIAGNOSIS — Z0489 Encounter for examination and observation for other specified reasons: Secondary | ICD-10-CM

## 2017-04-20 DIAGNOSIS — IMO0002 Reserved for concepts with insufficient information to code with codable children: Secondary | ICD-10-CM

## 2017-04-26 ENCOUNTER — Other Ambulatory Visit: Payer: Self-pay

## 2017-04-26 ENCOUNTER — Encounter: Payer: Self-pay | Admitting: Certified Nurse Midwife

## 2017-04-26 MED ORDER — FLUCONAZOLE 150 MG PO TABS: 150.0000 mg | ORAL_TABLET | Freq: Once | ORAL | 1 refills | Status: AC

## 2017-05-04 ENCOUNTER — Ambulatory Visit (INDEPENDENT_AMBULATORY_CARE_PROVIDER_SITE_OTHER): Payer: 59

## 2017-05-04 DIAGNOSIS — Z0489 Encounter for examination and observation for other specified reasons: Secondary | ICD-10-CM

## 2017-05-04 DIAGNOSIS — IMO0002 Reserved for concepts with insufficient information to code with codable children: Secondary | ICD-10-CM

## 2017-05-08 ENCOUNTER — Other Ambulatory Visit: Payer: Self-pay

## 2017-05-08 ENCOUNTER — Observation Stay
Admission: EM | Admit: 2017-05-08 | Discharge: 2017-05-08 | Disposition: A | Discharge status: Home / Self Care | Payer: 59 | Attending: Obstetrics and Gynecology | Admitting: Obstetrics and Gynecology

## 2017-05-08 DIAGNOSIS — O26892 Other specified pregnancy related conditions, second trimester: Principal | ICD-10-CM | POA: Insufficient documentation

## 2017-05-08 DIAGNOSIS — Z3A23 23 weeks gestation of pregnancy: Secondary | ICD-10-CM | POA: Insufficient documentation

## 2017-05-08 DIAGNOSIS — R1030 Lower abdominal pain, unspecified: Secondary | ICD-10-CM | POA: Diagnosis not present

## 2017-05-08 DIAGNOSIS — O368399 Maternal care for abnormalities of the fetal heart rate or rhythm, unspecified trimester, other fetus: Secondary | ICD-10-CM

## 2017-05-08 DIAGNOSIS — R109 Unspecified abdominal pain: Secondary | ICD-10-CM | POA: Diagnosis present

## 2017-05-08 LAB — URINALYSIS, ROUTINE W REFLEX MICROSCOPIC
Bilirubin Urine: NEGATIVE
Glucose, UA: NEGATIVE mg/dL
HGB URINE DIPSTICK: NEGATIVE
Ketones, ur: 20 mg/dL — AB
Leukocytes, UA: NEGATIVE
Nitrite: NEGATIVE
PROTEIN: 30 mg/dL — AB
SPECIFIC GRAVITY, URINE: 1.019 (ref 1.005–1.030)
pH: 6 (ref 5.0–8.0)

## 2017-05-08 MED ORDER — ACETAMINOPHEN 500 MG PO TABS
1000.0000 mg | ORAL_TABLET | Freq: Once | ORAL | Status: AC
  Administered 2017-05-08: 1000 mg via ORAL
  Filled 2017-05-08: qty 2

## 2017-05-08 NOTE — OB Triage Note (Signed)
G2/P1, 23w 2d here reporting sudden sharp, radiating pain, rated 8/10 that "made me hold on to the wall" from left and right abdomen and shoots down her leg(s). Does not feel her pain is contraction pain. Pt currently in no acute distress. Denies vaginal bleeding or discharge. Positive fetal movement as usual. VS stable. Monitors applied assessing.

## 2017-05-10 NOTE — Discharge Summary (Signed)
Physician Obstetric Discharge Summary  Patient ID: Sarah Haney MRN: [id number] DOB/AGE: [birth year] 22 y.o.   Date of Admission: 05/08/2017  Date of Discharge: 05/08/2017  Admitting Diagnosis: Observation at [redacted]w[redacted]d  Secondary Diagnosis: RH negative status      Discharge Diagnosis: No other diagnosis   Antepartum Procedures: NST   Brief Hospital Course   L&D OB Triage Note  Sarah Haney is a 22 y.o. G69P1001 female at [redacted]w[redacted]d, EDD Estimated Date of Delivery: 09/02/17 who presented to triage for complaints of intermittent sharp lower abdominal pain.  She was evaluated by the nurses with no significant findings for preterm labor or fetal distress. Vital signs stable. An NST was performed and has been reviewed by CNM. She was treated with Tylenol.   NST INTERPRETATION: Indications: rule out uterine contractions  Mode: External Baseline Rate (A): 140 bpm(fht 155) Variability: Moderate Accelerations: 10 x 10 Decelerations: Variable     Contraction Frequency (min): none  Impression: reactive   Plan: NST performed was reviewed and was found to be reactive. She was discharged home with bleeding/labor precautions.  Continue routine prenatal care. Follow up with OB/GYN as previously scheduled.    Discharge Instructions: Per After Visit Summary.  Activity: Also refer to After Visit Summary  Diet: Regular  Medications:  Allergies as of 05/08/2017   No Known Allergies     Medication List    ASK your doctor about these medications   acetaminophen 500 MG tablet Commonly known as:  TYLENOL Take 500 mg by mouth every 6 (six) hours as needed for mild pain. Reported on 1/95/0932   folic acid 671 MCG tablet Commonly known as:  FOLVITE Take 400 mcg by mouth daily.   ketoconazole 2 % cream Commonly known as:  NIZORAL Apply 1 application topically 2 (two) times daily as needed for irritation. To affected area on Arms   prenatal multivitamin Tabs tablet Take 1 tablet by  mouth daily at 12 noon.      Outpatient follow up:   Discharged Condition: stable  Discharged to: home   Diona Fanti, CNM [facility], [facility]

## 2017-05-18 ENCOUNTER — Ambulatory Visit (INDEPENDENT_AMBULATORY_CARE_PROVIDER_SITE_OTHER): Payer: 59 | Admitting: Certified Nurse Midwife

## 2017-05-18 VITALS — BP 110/66 | HR 105 | Wt 197.4 lb

## 2017-05-18 DIAGNOSIS — Z113 Encounter for screening for infections with a predominantly sexual mode of transmission: Secondary | ICD-10-CM

## 2017-05-18 DIAGNOSIS — Z3492 Encounter for supervision of normal pregnancy, unspecified, second trimester: Secondary | ICD-10-CM

## 2017-05-18 DIAGNOSIS — Z130 Encounter for screening for diseases of the blood and blood-forming organs and certain disorders involving the immune mechanism: Secondary | ICD-10-CM

## 2017-05-18 DIAGNOSIS — Z131 Encounter for screening for diabetes mellitus: Secondary | ICD-10-CM

## 2017-05-18 LAB — POCT URINALYSIS DIPSTICK
Bilirubin, UA: NEGATIVE
Blood, UA: NEGATIVE
Glucose, UA: NEGATIVE
Ketones, UA: NEGATIVE
LEUKOCYTES UA: NEGATIVE
NITRITE UA: NEGATIVE
Spec Grav, UA: 1.015 (ref 1.010–1.025)
Urobilinogen, UA: 0.2 E.U./dL
pH, UA: 7.5 (ref 5.0–8.0)

## 2017-05-18 NOTE — Progress Notes (Signed)
Pt is here for an ROB visit. 

## 2017-05-18 NOTE — Progress Notes (Signed)
ROB-Doing well except intermittent periods of tachycardia. Advised patient to continue to monitor symptoms and when will refer to cardiology if needed. Rash on arm remains, but is getting better. Discussed home treatment measures. Reviewed red flag symptoms and when to call. RTC x 4 weeks for 28 week labs and ROB or sooner if needed.

## 2017-05-18 NOTE — Patient Instructions (Addendum)
Common Medications Safe in Pregnancy  Acne:      Constipation:  Benzoyl Peroxide     Colace  Clindamycin      Dulcolax Suppository  Topica Erythromycin     Fibercon  Salicylic Acid      Metamucil         Miralax AVOID:        Senakot   Accutane    Cough:  Retin-A       Cough Drops  Tetracycline      Phenergan w/ Codeine if Rx  Minocycline      Robitussin (Plain & DM)  Antibiotics:     Crabs/Lice:  Ceclor       RID  Cephalosporins    AVOID:  E-Mycins      Kwell  Keflex  Macrobid/Macrodantin   Diarrhea:  Penicillin      Kao-Pectate  Zithromax      Imodium AD         PUSH FLUIDS AVOID:       Cipro     Fever:  Tetracycline      Tylenol (Regular or Extra  Minocycline       Strength)  Levaquin      Extra Strength-Do not          Exceed 8 tabs/24 hrs Caffeine:        <200mg/day (equiv. To 1 cup of coffee or  approx. 3 12 oz sodas)         Gas: Cold/Hayfever:       Gas-X  Benadryl      Mylicon  Claritin       Phazyme  **Claritin-D        Chlor-Trimeton    Headaches:  Dimetapp      ASA-Free Excedrin  Drixoral-Non-Drowsy     Cold Compress  Mucinex (Guaifenasin)     Tylenol (Regular or Extra  Sudafed/Sudafed-12 Hour     Strength)  **Sudafed PE Pseudoephedrine   Tylenol Cold & Sinus     Vicks Vapor Rub  Zyrtec  **AVOID if Problems With Blood Pressure         Heartburn: Avoid lying down for at least 1 hour after meals  Aciphex      Maalox     Rash:  Milk of Magnesia     Benadryl    Mylanta       1% Hydrocortisone Cream  Pepcid  Pepcid Complete   Sleep Aids:  Prevacid      Ambien   Prilosec       Benadryl  Rolaids       Chamomile Tea  Tums (Limit 4/day)     Unisom  Zantac       Tylenol PM         Warm milk-add vanilla or  Hemorrhoids:       Sugar for taste  Anusol/Anusol H.C.  (RX: Analapram 2.5%)  Sugar Substitutes:  Hydrocortisone OTC     Ok in moderation  Preparation H      Tucks        Vaseline lotion applied to tissue with  wiping    Herpes:     Throat:  Acyclovir      Oragel  Famvir  Valtrex     Vaccines:         Flu Shot Leg Cramps:       *Gardasil  Benadryl      Hepatitis A         Hepatitis B Nasal Spray:         Pneumovax  Saline Nasal Spray     Polio Booster         Tetanus Nausea:       Tuberculosis test or PPD  Vitamin B6 25 mg TID   AVOID:    Dramamine      *Gardasil  Emetrol       Live Poliovirus  Ginger Root 250 mg QID    MMR (measles, mumps &  High Complex Carbs @ Bedtime    rebella)  Sea Bands-Accupressure    Varicella (Chickenpox)  Unisom 1/2 tab TID     *No known complications           If received before Pain:         Known pregnancy;   Darvocet       Resume series after  Lortab        Delivery  Percocet    Yeast:   Tramadol      Femstat  Tylenol 3      Gyne-lotrimin  Ultram       Monistat  Vicodin           MISC:         All Sunscreens           Hair Coloring/highlights          Insect Repellant's          (Including DEET)         Mystic Tans Second Trimester of Pregnancy The second trimester is from week 13 through week 28, month 4 through 6. This is often the time in pregnancy that you feel your best. Often times, morning sickness has lessened or quit. You may have more energy, and you may get hungry more often. Your unborn baby (fetus) is growing rapidly. At the end of the sixth month, he or she is about 9 inches long and weighs about 1 pounds. You will likely feel the baby move (quickening) between 18 and 20 weeks of pregnancy. Follow these instructions at home:  Avoid all smoking, herbs, and alcohol. Avoid drugs not approved by your doctor.  Do not use any tobacco products, including cigarettes, chewing tobacco, and electronic cigarettes. If you need help quitting, ask your doctor. You may get counseling or other support to help you quit.  Only take medicine as told by your doctor. Some medicines are safe and some are not during pregnancy.  Exercise only as told by your  doctor. Stop exercising if you start having cramps.  Eat regular, healthy meals.  Wear a good support bra if your breasts are tender.  Do not use hot tubs, steam rooms, or saunas.  Wear your seat belt when driving.  Avoid raw meat, uncooked cheese, and liter boxes and soil used by cats.  Take your prenatal vitamins.  Take 1500-2000 milligrams of calcium daily starting at the 20th week of pregnancy until you deliver your baby.  Try taking medicine that helps you poop (stool softener) as needed, and if your doctor approves. Eat more fiber by eating fresh fruit, vegetables, and whole grains. Drink enough fluids to keep your pee (urine) clear or pale yellow.  Take warm water baths (sitz baths) to soothe pain or discomfort caused by hemorrhoids. Use hemorrhoid cream if your doctor approves.  If you have puffy, bulging veins (varicose veins), wear support hose. Raise (elevate) your feet for 15 minutes, 3-4 times a day. Limit salt in your diet.  Avoid heavy lifting, wear low heals, and sit up straight.    Rest with your legs raised if you have leg cramps or low back pain.  Visit your dentist if you have not gone during your pregnancy. Use a soft toothbrush to brush your teeth. Be gentle when you floss.  You can have sex (intercourse) unless your doctor tells you not to.  Go to your doctor visits. Get help if:  You feel dizzy.  You have mild cramps or pressure in your lower belly (abdomen).  You have a nagging pain in your belly area.  You continue to feel sick to your stomach (nauseous), throw up (vomit), or have watery poop (diarrhea).  You have bad smelling fluid coming from your vagina.  You have pain with peeing (urination). Get help right away if:  You have a fever.  You are leaking fluid from your vagina.  You have spotting or bleeding from your vagina.  You have severe belly cramping or pain.  You lose or gain weight rapidly.  You have trouble catching your  breath and have chest pain.  You notice sudden or extreme puffiness (swelling) of your face, hands, ankles, feet, or legs.  You have not felt the baby move in over an hour.  You have severe headaches that do not go away with medicine.  You have vision changes. This information is not intended to replace advice given to you by your health care provider. Make sure you discuss any questions you have with your health care provider. Document Released: 05/04/2009 Document Revised: 07/16/2015 Document Reviewed: 04/10/2012 Elsevier Interactive Patient Education  2017 [place]. Skin Conditions During Pregnancy Pregnancy affects many parts of your body. One part is your skin. Most skin problems that develop during pregnancy are not serious and are considered a normal part of pregnancy. They go away on their own after the baby is born. Other skin problems may need treatment. What type of skin problems can develop during pregnancy?  Stretch marks. Stretch marks are purple or pink lines on the skin. They may appear on the belly, breasts, thighs, or buttocks. Stretch marks are caused by weight gain that causes the skin to stretch. Stretch marks do not cause problems. Almost all women get them during pregnancy.  Darkening of the skin (hyperpigmentation). The darkening may occur in patches or as a line. Patches may appear on the face, nipples, or genital area. Lines often stretch from the belly button to the pubic area. Hyperpigmentation develops in almost all pregnant women. It is more severe in women with a dark complexion.  Spider angiomas. These are tiny pink or red lines that go out from a center point, like the legs of a spider. Usually, they are on the face, neck, and arms. They do not cause problems. They are most common in women with light complexions.  Palmar erythema. This is a reddening of the palms. It is most common in women with light complexions.  Swelling and redness. This can occur on  the face, eyelids, fingers, or toes.  Pruritic urticarial papules and plaques of pregnancy (PUPPP). This is a rash that is itchy, red, and has tiny blisters. The cause is unknown. It usually starts on the abdomen and may affect the arms or legs. It does not affect the face. It usually begins later in pregnancy. About a third of all pregnant women develop this condition. There are no associated problems to the fetus with this rash. Sometimes, oral steroids are used to calm down the itch. The rash clears after the baby is born.  Prurigo of pregnancy. This is a disease in which red patches and bumps appear on the arms and legs. The cause is unknown. The patches and bumps clear after the baby is born. About a third of pregnant women develop this disease.  Acne. Pimples may develop, including in women who have had clear skin for a long time.  Skin tags. These are small flaps of skin that stick out from the body. They may grow or become darker during pregnancy. They are usually harmless.  Moles. These are flat or slightly raised growths. They are usually round and pink or brown. They may grow or become darker during pregnancy.  Intrahepatic cholestasis of pregnancy. This is a rare condition that causes itchy skin. It may run in families. It increases the risk of complications for the fetus. This condition usually resolves after delivery. It can recur with subsequent pregnancies.  Impetigo herpetiformis. This is a form of a severe skin disease called pustular psoriasis. Usually, delivery is the only method of resolving the condition.  Pruritic folliculitis of pregnancy. This is a rare condition that causes pimple-like skin growths. It develops in the middle or later stages of pregnancy. Its cause is unknown.It usually resolves 2-3 weeks after delivery.  Pemphigoid gestationis. This is a very rare autoimmune disease. It causes a severely itchy rash and blisters. The rash does not appear on the face,  scalp, or inside of the mouth. It usually resolves 3 months after delivery. It may recur with subsequent pregnancies. Some pre-existing skin conditions, such as atopic dermatitis, may become worse during pregnancy. Follow these instructions at home: Different conditions may have different instructions. In general:  Follow all your health care provider's directions about medicines to treat skin problems while you are pregnant. Do not use any over-the-counter medicines (including medicated creams and lotions) until you have checked with your health care provider. Many medicines are not safe to use when you are pregnant.  Avoid time in the sun. This will help keep your skin from darkening. When you must be outside, use sunscreen and wear a hat with a wide brim to protect your face. The sunscreen should have a SPF of at least 80. This may help limit dark spots that develop when the skin is exposed to the sun.  To avoid problems from stretched skin: ? Do not sit or stand for long periods of time. ? Exercise regularly. This helps keep your skin in good condition.  Use a gentle soap. This helps prevent acne.  Do not get too hot or too sweaty. This makes some skin rashes worse.  Wear loose clothes made of a soft fabric. This prevents skin irritation.  For itching, add oatmeal or cornstarch to your bathwater.  Use a skin moisturizer. Ask your health care provider for suggestions.  This information is not intended to replace advice given to you by your health care provider. Make sure you discuss any questions you have with your health care provider. Document Released: 03/12/2010 Document Revised: 07/16/2015 Document Reviewed: 11/19/2012 Elsevier Interactive Patient Education  [employer].

## 2017-06-14 ENCOUNTER — Ambulatory Visit (INDEPENDENT_AMBULATORY_CARE_PROVIDER_SITE_OTHER): Payer: 59 | Admitting: Certified Nurse Midwife

## 2017-06-14 ENCOUNTER — Other Ambulatory Visit: Payer: 59

## 2017-06-14 ENCOUNTER — Encounter: Payer: Self-pay | Admitting: Certified Nurse Midwife

## 2017-06-14 VITALS — BP 114/62 | HR 98 | Wt 198.4 lb

## 2017-06-14 DIAGNOSIS — Z3492 Encounter for supervision of normal pregnancy, unspecified, second trimester: Secondary | ICD-10-CM

## 2017-06-14 DIAGNOSIS — Z113 Encounter for screening for infections with a predominantly sexual mode of transmission: Secondary | ICD-10-CM

## 2017-06-14 DIAGNOSIS — Z131 Encounter for screening for diabetes mellitus: Secondary | ICD-10-CM

## 2017-06-14 DIAGNOSIS — Z130 Encounter for screening for diseases of the blood and blood-forming organs and certain disorders involving the immune mechanism: Secondary | ICD-10-CM

## 2017-06-14 LAB — POCT URINALYSIS DIPSTICK
BILIRUBIN UA: NEGATIVE
Blood, UA: NEGATIVE
Glucose, UA: NEGATIVE
KETONES UA: NEGATIVE
Leukocytes, UA: NEGATIVE
Nitrite, UA: NEGATIVE
PH UA: 7 (ref 5.0–8.0)
SPEC GRAV UA: 1.015 (ref 1.010–1.025)
UROBILINOGEN UA: 0.2 U/dL

## 2017-06-14 MED ORDER — RHO D IMMUNE GLOBULIN 1500 UNITS IM SOSY
1500.0000 [IU] | PREFILLED_SYRINGE | Freq: Once | INTRAMUSCULAR | Status: AC
Start: 2017-06-14 — End: 2017-06-14
  Administered 2017-06-14: 1500 [IU] via INTRAMUSCULAR

## 2017-06-14 MED ORDER — TETANUS-DIPHTH-ACELL PERTUSSIS 5-2.5-18.5 LF-MCG/0.5 IM SUSP
0.5000 mL | Freq: Once | INTRAMUSCULAR | Status: AC
  Administered 2017-06-14: 0.5 mL via INTRAMUSCULAR

## 2017-06-14 MED ORDER — TRIAMCINOLONE ACETONIDE 0.5 % EX OINT: 1.0000 "application " | TOPICAL_OINTMENT | Freq: Two times a day (BID) | CUTANEOUS | 0 refills | Status: DC

## 2017-06-14 NOTE — Patient Instructions (Signed)

## 2017-06-14 NOTE — Progress Notes (Addendum)
Pt is here for an [place] visit.Tdap,papers,labs done,Rhogam

## 2017-06-14 NOTE — Progress Notes (Addendum)
Patient reports doing well with good fetal movement, but does have a rash on both arms that has been unrelieved by rx ketoconazole,otc fungal cream, or apple cider vinegar. Consulted with Melody on rash. Recommended tea tree oil & triamcinolone cream. Order placed with instructions for use.    28 week labs today, with tdap given, BTC, Rhogam today.  Pt reports intermittent periods of tachycardia have resolved, and she will monitor symptoms. Discussed red flag symptoms and when to call or come in. Birth control options reviewed, pamphlet given,prefers to use condoms. Discussed cord blood options, and pamphlet given.Questions discussed and answered. .Return in 2 weeks for ROB.  Sarah Haney,SNM/ Sarah Haney,CNM

## 2017-06-14 NOTE — Addendum Note (Signed)
Addended by: Raliegh Ip on: 06/14/2017 11:25 AM   Modules accepted: Orders

## 2017-06-15 LAB — CBC
Hematocrit: 32.5 % — ABNORMAL LOW (ref 34.0–46.6)
Hemoglobin: 10.8 g/dL — ABNORMAL LOW (ref 11.1–15.9)
MCH: 29.1 pg (ref 26.6–33.0)
MCHC: 33.2 g/dL (ref 31.5–35.7)
MCV: 88 fL (ref 79–97)
PLATELETS: 253 10*3/uL (ref 150–379)
RBC: 3.71 x10E6/uL — AB (ref 3.77–5.28)
RDW: 13.8 % (ref 12.3–15.4)
WBC: 8.9 10*3/uL (ref 3.4–10.8)

## 2017-06-15 LAB — GLUCOSE, 1 HOUR GESTATIONAL: Gestational Diabetes Screen: 135 mg/dL (ref 65–139)

## 2017-06-15 LAB — RPR: RPR: NONREACTIVE

## 2017-06-19 ENCOUNTER — Encounter: Payer: Self-pay | Admitting: Certified Nurse Midwife

## 2017-06-19 DIAGNOSIS — Z9289 Personal history of other medical treatment: Secondary | ICD-10-CM | POA: Insufficient documentation

## 2017-06-19 DIAGNOSIS — Z862 Personal history of diseases of the blood and blood-forming organs and certain disorders involving the immune mechanism: Secondary | ICD-10-CM

## 2017-06-19 DIAGNOSIS — Z8759 Personal history of other complications of pregnancy, childbirth and the puerperium: Secondary | ICD-10-CM | POA: Insufficient documentation

## 2017-06-19 DIAGNOSIS — O99019 Anemia complicating pregnancy, unspecified trimester: Secondary | ICD-10-CM | POA: Insufficient documentation

## 2017-06-26 ENCOUNTER — Telehealth: Payer: Self-pay | Admitting: *Deleted

## 2017-06-26 NOTE — Telephone Encounter (Signed)
Patient called and states she is experiencing some cramping pain, Lower abd . Patient is 30 weeks preg. Patient is not experiencing any other symptoms. Patient is requesting a call back. Her contact # [phone]

## 2017-06-28 ENCOUNTER — Ambulatory Visit (INDEPENDENT_AMBULATORY_CARE_PROVIDER_SITE_OTHER): Payer: 59 | Admitting: Obstetrics and Gynecology

## 2017-06-28 ENCOUNTER — Other Ambulatory Visit: Payer: Self-pay | Admitting: Obstetrics and Gynecology

## 2017-06-28 VITALS — BP 112/84 | HR 110 | Wt 198.1 lb

## 2017-06-28 DIAGNOSIS — Z3493 Encounter for supervision of normal pregnancy, unspecified, third trimester: Secondary | ICD-10-CM | POA: Diagnosis not present

## 2017-06-28 LAB — POCT URINALYSIS DIPSTICK
BILIRUBIN UA: NEGATIVE
Blood, UA: NEGATIVE
Glucose, UA: NEGATIVE
KETONES UA: NEGATIVE
Leukocytes, UA: NEGATIVE
Nitrite, UA: NEGATIVE
PH UA: 7 (ref 5.0–8.0)
SPEC GRAV UA: 1.01 (ref 1.010–1.025)
UROBILINOGEN UA: 0.2 U/dL

## 2017-06-28 MED ORDER — CITRANATAL BLOOM 90-1 MG PO TABS: 1.0000 | ORAL_TABLET | Freq: Every day | ORAL | 11 refills | Status: DC

## 2017-06-28 NOTE — Progress Notes (Signed)
ROB- pt is having some lower abd cramping/pain

## 2017-06-28 NOTE — Progress Notes (Signed)
ROB- doing well except lower pubic cramping and pain, eased up when lying down, worse with walking. No trauma. No pain today. Recommend maternity belt. Started zantac nightly.

## 2017-07-12 ENCOUNTER — Ambulatory Visit (INDEPENDENT_AMBULATORY_CARE_PROVIDER_SITE_OTHER): Payer: 59 | Admitting: Certified Nurse Midwife

## 2017-07-12 VITALS — BP 107/66 | HR 107 | Wt 200.6 lb

## 2017-07-12 DIAGNOSIS — Z3493 Encounter for supervision of normal pregnancy, unspecified, third trimester: Secondary | ICD-10-CM

## 2017-07-12 LAB — POCT URINALYSIS DIPSTICK
BILIRUBIN UA: NEGATIVE
Blood, UA: NEGATIVE
Glucose, UA: NEGATIVE
Ketones, UA: NEGATIVE
LEUKOCYTES UA: NEGATIVE
Nitrite, UA: NEGATIVE
PH UA: 6 (ref 5.0–8.0)
PROTEIN UA: POSITIVE — AB
Spec Grav, UA: 1.025 (ref 1.010–1.025)
UROBILINOGEN UA: 0.2 U/dL

## 2017-07-12 NOTE — Patient Instructions (Signed)
Braxton Hicks Contractions °Contractions of the uterus can occur throughout pregnancy, but they are not always a sign that you are in labor. You may have practice contractions called Braxton Hicks contractions. These false labor contractions are sometimes confused with true labor. °What are Braxton Hicks contractions? °Braxton Hicks contractions are tightening movements that occur in the muscles of the uterus before labor. Unlike true labor contractions, these contractions do not result in opening (dilation) and thinning of the cervix. Toward the end of pregnancy (32-34 weeks), Braxton Hicks contractions can happen more often and may become stronger. These contractions are sometimes difficult to tell apart from true labor because they can be very uncomfortable. You should not feel embarrassed if you go to the hospital with false labor. °Sometimes, the only way to tell if you are in true labor is for your health care provider to look for changes in the cervix. The health care provider will do a physical exam and may monitor your contractions. If you are not in true labor, the exam should show that your cervix is not dilating and your water has not broken. °If there are other health problems associated with your pregnancy, it is completely safe for you to be sent home with false labor. You may continue to have Braxton Hicks contractions until you go into true labor. °How to tell the difference between true labor and false labor °True labor °· Contractions last 30-70 seconds. °· Contractions become very regular. °· Discomfort is usually felt in the top of the uterus, and it spreads to the lower abdomen and low back. °· Contractions do not go away with walking. °· Contractions usually become more intense and increase in frequency. °· The cervix dilates and gets thinner. °False labor °· Contractions are usually shorter and not as strong as true labor contractions. °· Contractions are usually irregular. °· Contractions  are often felt in the front of the lower abdomen and in the groin. °· Contractions may go away when you walk around or change positions while lying down. °· Contractions get weaker and are shorter-lasting as time goes on. °· The cervix usually does not dilate or become thin. °Follow these instructions at home: °· Take over-the-counter and prescription medicines only as told by your health care provider. °· Keep up with your usual exercises and follow other instructions from your health care provider. °· Eat and drink lightly if you think you are going into labor. °· If Braxton Hicks contractions are making you uncomfortable: °? Change your position from lying down or resting to walking, or change from walking to resting. °? Sit and rest in a tub of warm water. °? Drink enough fluid to keep your urine pale yellow. Dehydration may cause these contractions. °? Do slow and deep breathing several times an hour. °· Keep all follow-up prenatal visits as told by your health care provider. This is important. °Contact a health care provider if: °· You have a fever. °· You have continuous pain in your abdomen. °Get help right away if: °· Your contractions become stronger, more regular, and closer together. °· You have fluid leaking or gushing from your vagina. °· You pass blood-tinged mucus (bloody show). °· You have bleeding from your vagina. °· You have low back pain that you never had before. °· You feel your baby’s head pushing down and causing pelvic pressure. °· Your baby is not moving inside you as much as it used to. °Summary °· Contractions that occur before labor are called Braxton   Hicks contractions, false labor, or practice contractions. °· Braxton Hicks contractions are usually shorter, weaker, farther apart, and less regular than true labor contractions. True labor contractions usually become progressively stronger and regular and they become more frequent. °· Manage discomfort from Braxton Hicks contractions by  changing position, resting in a warm bath, drinking plenty of water, or practicing deep breathing. °This information is not intended to replace advice given to you by your health care provider. Make sure you discuss any questions you have with your health care provider. °Document Released: 06/23/2016 Document Revised: 06/23/2016 Document Reviewed: 06/23/2016 °Elsevier Interactive Patient Education © 2018 [employer] Inc[employer] ° °

## 2017-07-12 NOTE — Progress Notes (Signed)
Pt is here for an ROB visit. 

## 2017-07-12 NOTE — Progress Notes (Signed)
Rob,doing well, good fetal movement,reports rash getting better and relief with Kenalog cream. Plaques on both arms show a decrease in redness and inflammation.  RTC in 2 weeks.  Shanika Creacy,SNM/Cataleya Cristina,CNM

## 2017-07-13 ENCOUNTER — Observation Stay
Admission: EM | Admit: 2017-07-13 | Discharge: 2017-07-13 | Disposition: A | Discharge status: Home / Self Care | Payer: 59 | Attending: Certified Nurse Midwife | Admitting: Certified Nurse Midwife

## 2017-07-13 ENCOUNTER — Encounter: Payer: 59 | Admitting: Certified Nurse Midwife

## 2017-07-13 DIAGNOSIS — O9989 Other specified diseases and conditions complicating pregnancy, childbirth and the puerperium: Secondary | ICD-10-CM | POA: Diagnosis not present

## 2017-07-13 DIAGNOSIS — Z3490 Encounter for supervision of normal pregnancy, unspecified, unspecified trimester: Secondary | ICD-10-CM

## 2017-07-13 DIAGNOSIS — Z3A32 32 weeks gestation of pregnancy: Secondary | ICD-10-CM | POA: Diagnosis not present

## 2017-07-13 DIAGNOSIS — Z8759 Personal history of other complications of pregnancy, childbirth and the puerperium: Secondary | ICD-10-CM

## 2017-07-13 DIAGNOSIS — O99019 Anemia complicating pregnancy, unspecified trimester: Secondary | ICD-10-CM

## 2017-07-13 DIAGNOSIS — Z862 Personal history of diseases of the blood and blood-forming organs and certain disorders involving the immune mechanism: Secondary | ICD-10-CM

## 2017-07-13 DIAGNOSIS — Z9289 Personal history of other medical treatment: Secondary | ICD-10-CM

## 2017-07-13 LAB — ROM PLUS (ARMC ONLY): Rom Plus: NEGATIVE

## 2017-07-13 MED ORDER — LACTATED RINGERS IV SOLN: 500.0000 mL | INTRAVENOUS | Status: DC | PRN

## 2017-07-13 NOTE — OB Triage Note (Signed)
G2P1 [redacted]w[redacted]d presents to [facility] for evaluation of LOF.

## 2017-07-13 NOTE — Progress Notes (Signed)
   L&D OB Triage Note  SUBJECTIVE Sarah Haney is a 22 y.o. G2P1001 female at [redacted]w[redacted]d, EDD Estimated Date of Delivery: 09/02/17 who presented to triage with complaints of leaking of fluid. She denies contractions and feels good fetal movement   OB History  Gravida Para Term Preterm AB Living  2 1 1  0 0 1  SAB TAB Ectopic Multiple Live Births  0 0 0 0 1    # Outcome Date GA Lbr Len/2nd Weight Sex Delivery Anes PTL Lv  2 Current           1 Term 04/30/15 [redacted]w[redacted]d / 00:27 8 lb 7.5 oz (3.84 kg) F Vag-Spont EPI  LIV     Name: HAMBY,GIRL Rhilyn     Apgar1: 7  Apgar5: 9    Medications Prior to Admission  Medication Sig Dispense Refill Last Dose  . Prenatal-DSS-FeCb-FeGl-FA (CITRANATAL BLOOM) 90-1 MG TABS Take 1 tablet by mouth daily. 30 tablet 11 07/12/2017 at Unknown time  . ranitidine (ZANTAC) 150 MG tablet Take 150 mg by mouth 2 (two) times daily.   07/12/2017 at Unknown time  . acetaminophen (TYLENOL) 500 MG tablet Take 500 mg by mouth every 6 (six) hours as needed for mild pain. Reported on 06/02/2015   Taking  . ketoconazole (NIZORAL) 2 % cream Apply 1 application topically 2 (two) times daily as needed for irritation. To affected area on Arms (Patient not taking: Reported on 05/08/2017) 15 g 0 Not Taking  . triamcinolone ointment (KENALOG) 0.5 % Apply 1 application topically 2 (two) times daily. 30 g 0      OBJECTIVE  Nursing Evaluation:   BP 130/78 (BP Location: Left Arm)   Pulse (!) 124   Temp 98.2 F (36.8 C) (Oral)   Resp 18   LMP 12/18/2016    Findings:  Intact membranes  NST was performed and has been reviewed by me.  NST INTERPRETATION: Category I  Mode: External Baseline Rate (A): 150 bpm Variability: Moderate Accelerations: 15 x 15 Decelerations: None     Contraction Frequency (min): None  ROM plus negative Nitrazine negative  ASSESSMENT Impression:  1.  Pregnancy:  G2P1001 at [redacted]w[redacted]d , EDD Estimated Date of Delivery: 09/02/17 2.  NST:  Category  I  PLAN 1. Reassurance given 2. Discharge home with standard labor precautions given to return to L&D or call the office for problems. 3. Continue routine prenatal care.  Philip Aspen, CNM

## 2017-07-24 ENCOUNTER — Encounter: Payer: Self-pay | Admitting: Obstetrics and Gynecology

## 2017-07-26 ENCOUNTER — Ambulatory Visit (INDEPENDENT_AMBULATORY_CARE_PROVIDER_SITE_OTHER): Payer: 59 | Admitting: Obstetrics and Gynecology

## 2017-07-26 VITALS — BP 116/84 | HR 106 | Wt 201.9 lb

## 2017-07-26 DIAGNOSIS — Z3493 Encounter for supervision of normal pregnancy, unspecified, third trimester: Secondary | ICD-10-CM

## 2017-07-26 LAB — POCT URINALYSIS DIPSTICK
Bilirubin, UA: NEGATIVE
GLUCOSE UA: NEGATIVE
Ketones, UA: NEGATIVE
LEUKOCYTES UA: NEGATIVE
Nitrite, UA: NEGATIVE
Protein, UA: POSITIVE — AB
RBC UA: NEGATIVE
Spec Grav, UA: 1.01 (ref 1.010–1.025)
Urobilinogen, UA: 0.2 E.U./dL
pH, UA: 7 (ref 5.0–8.0)

## 2017-07-26 NOTE — Progress Notes (Signed)
ROB- pt is having contractions, having some pelvic pressure

## 2017-07-26 NOTE — Progress Notes (Signed)
ROB-reports irregular but painful in the morning and evenings, denies spotting or LOF. Discussed [place] and labor precautions.

## 2017-07-27 ENCOUNTER — Other Ambulatory Visit: Payer: Self-pay | Admitting: *Deleted

## 2017-07-27 MED ORDER — PANTOPRAZOLE SODIUM 40 MG PO TBEC: 40.0000 mg | DELAYED_RELEASE_TABLET | Freq: Every day | ORAL | 2 refills | Status: DC

## 2017-08-07 ENCOUNTER — Encounter: Payer: Self-pay | Admitting: Obstetrics and Gynecology

## 2017-08-09 ENCOUNTER — Observation Stay
Admission: EM | Admit: 2017-08-09 | Discharge: 2017-08-09 | Disposition: A | Discharge status: Home / Self Care | Payer: 59 | Attending: Certified Nurse Midwife | Admitting: Certified Nurse Midwife

## 2017-08-09 ENCOUNTER — Other Ambulatory Visit: Payer: Self-pay

## 2017-08-09 DIAGNOSIS — O26893 Other specified pregnancy related conditions, third trimester: Secondary | ICD-10-CM | POA: Diagnosis present

## 2017-08-09 DIAGNOSIS — O368390 Maternal care for abnormalities of the fetal heart rate or rhythm, unspecified trimester, not applicable or unspecified: Secondary | ICD-10-CM | POA: Diagnosis not present

## 2017-08-09 DIAGNOSIS — Z79899 Other long term (current) drug therapy: Secondary | ICD-10-CM | POA: Insufficient documentation

## 2017-08-09 DIAGNOSIS — R031 Nonspecific low blood-pressure reading: Secondary | ICD-10-CM | POA: Diagnosis present

## 2017-08-09 DIAGNOSIS — O4703 False labor before 37 completed weeks of gestation, third trimester: Secondary | ICD-10-CM | POA: Insufficient documentation

## 2017-08-09 DIAGNOSIS — R51 Headache: Secondary | ICD-10-CM

## 2017-08-09 DIAGNOSIS — R42 Dizziness and giddiness: Secondary | ICD-10-CM | POA: Insufficient documentation

## 2017-08-09 DIAGNOSIS — Z3A36 36 weeks gestation of pregnancy: Secondary | ICD-10-CM | POA: Insufficient documentation

## 2017-08-09 DIAGNOSIS — G43909 Migraine, unspecified, not intractable, without status migrainosus: Secondary | ICD-10-CM | POA: Diagnosis not present

## 2017-08-09 MED ORDER — ACETAMINOPHEN 500 MG PO TABS: 1000.0000 mg | ORAL_TABLET | Freq: Once | ORAL | Status: DC

## 2017-08-09 MED ORDER — ACETAMINOPHEN 500 MG PO TABS
ORAL_TABLET | ORAL | Status: AC
  Filled 2017-08-09: qty 2

## 2017-08-09 MED ORDER — BUTALBITAL-APAP-CAFFEINE 50-325-40 MG PO TABS
1.0000 | ORAL_TABLET | Freq: Once | ORAL | Status: AC
  Administered 2017-08-09: 1 via ORAL

## 2017-08-09 MED ORDER — BUTALBITAL-APAP-CAFFEINE 50-325-40 MG PO TABS
ORAL_TABLET | ORAL | Status: AC
  Filled 2017-08-09: qty 1

## 2017-08-09 NOTE — OB Triage Note (Signed)
Patient came in for observation for dizziness, headache, and blurred vision for the past two days. Patient reports irregular braxton contractions that are not painful. Patient denies leaking, denies vaginal bleeding and spotting. Vital signs stable and patient afebrile. FHR baseline 145 with moderate variability with accelerations 15 x 15 and no decelerations. Family at bedside. Will continue to monitor.

## 2017-08-09 NOTE — OB Triage Note (Addendum)
L&D OB Triage Note  SUBJECTIVE Sarah Haney is a 22 y.o. G2P1001 female at [redacted]w[redacted]d, EDD Estimated Date of Delivery: 09/02/17 who presented to triage with complaints of light headedness and low blood pressure at home. She feels good movement and denies LOF, Vaginal bleeding. She has occasional braxton hicks contractions. She has a headache 6/10 on pain scale she has a history of migraines and state she took tylenol 3 hrs ago that did not help.   OB History  Gravida Para Term Preterm AB Living  2 1 1  0 0 1  SAB TAB Ectopic Multiple Live Births  0 0 0 0 1    # Outcome Date GA Lbr Len/2nd Weight Sex Delivery Anes PTL Lv  2 Current           1 Term 04/30/15 [redacted]w[redacted]d / 00:27 8 lb 7.5 oz (3.84 kg) F Vag-Spont EPI  LIV     Name: Sarah Haney     Apgar1: 7  Apgar5: 9    Medications Prior to Admission  Medication Sig Dispense Refill Last Dose  . pantoprazole (PROTONIX) 40 MG tablet Take 1 tablet (40 mg total) by mouth daily. 30 tablet 2 08/09/2017 at Unknown time  . Prenatal-DSS-FeCb-FeGl-FA (CITRANATAL BLOOM) 90-1 MG TABS Take 1 tablet by mouth daily. 30 tablet 11 08/09/2017 at Unknown time  . acetaminophen (TYLENOL) 500 MG tablet Take 500 mg by mouth every 6 (six) hours as needed for mild pain. Reported on 06/02/2015   Taking  . ketoconazole (NIZORAL) 2 % cream Apply 1 application topically 2 (two) times daily as needed for irritation. To affected area on Arms (Patient not taking: Reported on 05/08/2017) 15 g 0 Not Taking  . ranitidine (ZANTAC) 150 MG tablet Take 150 mg by mouth 2 (two) times daily.   Not Taking at Unknown time  . triamcinolone ointment (KENALOG) 0.5 % Apply 1 application topically 2 (two) times daily. (Patient not taking: Reported on 08/09/2017) 30 g 0 Not Taking at Unknown time     OBJECTIVE  Nursing Evaluation:   BP 107/62 (BP Location: Right Arm)   Pulse 89   Temp 98.3 F (36.8 C) (Oral)   Resp 19   Ht 5\' 5"  (1.651 m)   Wt 201 lb (91.2 kg)   LMP 12/18/2016   BMI  33.45 kg/m    Findings:   Reactive NST, Normal Blood pressure  NST was performed and has been reviewed by me.  NST INTERPRETATION: Category I  Mode: External Baseline Rate (A): 150 bpm(fht)  Moderate variability Accelerations present Decelerations absent  Occasional contractions Per RN- she has no other signs or symptoms associated with Pre-e.  ASSESSMENT Impression:  1.  Pregnancy:  G2P1001 at [redacted]w[redacted]d , EDD Estimated Date of Delivery: 09/02/17 2.  NST:  Category I  PLAN 1. Reassurance given, Discussed dizziness in pregnancy. Encouraged PO hydration, Good nutrition, and standing slowly to prevent fall. Also discouraged standing/sitting for excessive amount of time. Red flag symptoms reviewed.Fioricet 1 tab PO for migraine headache x 1 prior to discharge. Pt encouraged to avoid bright lights and stimulation with Migraines follow up in office tomorrow if headache persists. Her family member will drive her home. 2. Discharge home with standard labor precautions given to return to L&D or call the office for problems. 3. Continue routine prenatal care as scheduled  Philip Aspen, CNM

## 2017-08-16 ENCOUNTER — Ambulatory Visit (INDEPENDENT_AMBULATORY_CARE_PROVIDER_SITE_OTHER): Payer: 59 | Admitting: Certified Nurse Midwife

## 2017-08-16 ENCOUNTER — Encounter: Payer: 59 | Admitting: Obstetrics and Gynecology

## 2017-08-16 VITALS — BP 103/74 | HR 98 | Wt 203.5 lb

## 2017-08-16 DIAGNOSIS — Z3493 Encounter for supervision of normal pregnancy, unspecified, third trimester: Secondary | ICD-10-CM

## 2017-08-16 LAB — POCT URINALYSIS DIPSTICK
BILIRUBIN UA: NEGATIVE
Blood, UA: NEGATIVE
GLUCOSE UA: NEGATIVE
KETONES UA: NEGATIVE
Leukocytes, UA: NEGATIVE
NITRITE UA: NEGATIVE
PROTEIN UA: POSITIVE — AB
SPEC GRAV UA: 1.025 (ref 1.010–1.025)
Urobilinogen, UA: 0.2 E.U./dL
pH, UA: 6.5 (ref 5.0–8.0)

## 2017-08-16 NOTE — Progress Notes (Signed)
ROB doing well. Feeling better. Feels good fetal movement. Has pressure and contractions. Labor precautions reviewed. SVE per pt request. PT states she has been feeling very anxious. She has some family things going on and asked if there is anything she can do. We discussed counseling and medications optoins. Information sheet on zoloft in pregnancy given. PT will think about it and let us know. PHQ 9 score-1 Follow up 1 wk.    Philip Aspen, CNM

## 2017-08-16 NOTE — Patient Instructions (Signed)
Braxton Hicks Contractions °Contractions of the uterus can occur throughout pregnancy, but they are not always a sign that you are in labor. You may have practice contractions called Braxton Hicks contractions. These false labor contractions are sometimes confused with true labor. °What are Braxton Hicks contractions? °Braxton Hicks contractions are tightening movements that occur in the muscles of the uterus before labor. Unlike true labor contractions, these contractions do not result in opening (dilation) and thinning of the cervix. Toward the end of pregnancy (32-34 weeks), Braxton Hicks contractions can happen more often and may become stronger. These contractions are sometimes difficult to tell apart from true labor because they can be very uncomfortable. You should not feel embarrassed if you go to the hospital with false labor. °Sometimes, the only way to tell if you are in true labor is for your health care provider to look for changes in the cervix. The health care provider will do a physical exam and may monitor your contractions. If you are not in true labor, the exam should show that your cervix is not dilating and your water has not broken. °If there are other health problems associated with your pregnancy, it is completely safe for you to be sent home with false labor. You may continue to have Braxton Hicks contractions until you go into true labor. °How to tell the difference between true labor and false labor °True labor °· Contractions last 30-70 seconds. °· Contractions become very regular. °· Discomfort is usually felt in the top of the uterus, and it spreads to the lower abdomen and low back. °· Contractions do not go away with walking. °· Contractions usually become more intense and increase in frequency. °· The cervix dilates and gets thinner. °False labor °· Contractions are usually shorter and not as strong as true labor contractions. °· Contractions are usually irregular. °· Contractions  are often felt in the front of the lower abdomen and in the groin. °· Contractions may go away when you walk around or change positions while lying down. °· Contractions get weaker and are shorter-lasting as time goes on. °· The cervix usually does not dilate or become thin. °Follow these instructions at home: °· Take over-the-counter and prescription medicines only as told by your health care provider. °· Keep up with your usual exercises and follow other instructions from your health care provider. °· Eat and drink lightly if you think you are going into labor. °· If Braxton Hicks contractions are making you uncomfortable: °? Change your position from lying down or resting to walking, or change from walking to resting. °? Sit and rest in a tub of warm water. °? Drink enough fluid to keep your urine pale yellow. Dehydration may cause these contractions. °? Do slow and deep breathing several times an hour. °· Keep all follow-up prenatal visits as told by your health care provider. This is important. °Contact a health care provider if: °· You have a fever. °· You have continuous pain in your abdomen. °Get help right away if: °· Your contractions become stronger, more regular, and closer together. °· You have fluid leaking or gushing from your vagina. °· You pass blood-tinged mucus (bloody show). °· You have bleeding from your vagina. °· You have low back pain that you never had before. °· You feel your baby’s head pushing down and causing pelvic pressure. °· Your baby is not moving inside you as much as it used to. °Summary °· Contractions that occur before labor are called Braxton   Hicks contractions, false labor, or practice contractions. °· Braxton Hicks contractions are usually shorter, weaker, farther apart, and less regular than true labor contractions. True labor contractions usually become progressively stronger and regular and they become more frequent. °· Manage discomfort from Braxton Hicks contractions by  changing position, resting in a warm bath, drinking plenty of water, or practicing deep breathing. °This information is not intended to replace advice given to you by your health care provider. Make sure you discuss any questions you have with your health care provider. °Document Released: 06/23/2016 Document Revised: 06/23/2016 Document Reviewed: 06/23/2016 °Elsevier Interactive Patient Education © 2018 [employer] Inc[employer] ° °

## 2017-08-16 NOTE — Progress Notes (Signed)
Pt is here for an [place] visit.Screening 1

## 2017-08-23 ENCOUNTER — Ambulatory Visit (INDEPENDENT_AMBULATORY_CARE_PROVIDER_SITE_OTHER): Payer: 59 | Admitting: Certified Nurse Midwife

## 2017-08-23 VITALS — BP 114/73 | HR 100 | Wt 206.1 lb

## 2017-08-23 DIAGNOSIS — Z3493 Encounter for supervision of normal pregnancy, unspecified, third trimester: Secondary | ICD-10-CM | POA: Diagnosis not present

## 2017-08-23 LAB — POCT URINALYSIS DIPSTICK
Bilirubin, UA: NEGATIVE
GLUCOSE UA: NEGATIVE
KETONES UA: NEGATIVE
LEUKOCYTES UA: NEGATIVE
NITRITE UA: NEGATIVE
PROTEIN UA: POSITIVE — AB
RBC UA: NEGATIVE
SPEC GRAV UA: 1.02 (ref 1.010–1.025)
Urobilinogen, UA: 0.2 E.U./dL
pH, UA: 6.5 (ref 5.0–8.0)

## 2017-08-23 NOTE — Progress Notes (Signed)
ROB, doing well, irregular contractions, lost mucous plug Saturday,good fetal movement. Labor precautions reviewed. SVE per request today. Patient says anxiety has resolved since last week and she is coping well. RTC X 1 week.   ShanikaCreacy,SNM/Ethyn Schetter,CNM

## 2017-08-23 NOTE — Patient Instructions (Signed)
Braxton Hicks Contractions °Contractions of the uterus can occur throughout pregnancy, but they are not always a sign that you are in labor. You may have practice contractions called Braxton Hicks contractions. These false labor contractions are sometimes confused with true labor. °What are Braxton Hicks contractions? °Braxton Hicks contractions are tightening movements that occur in the muscles of the uterus before labor. Unlike true labor contractions, these contractions do not result in opening (dilation) and thinning of the cervix. Toward the end of pregnancy (32-34 weeks), Braxton Hicks contractions can happen more often and may become stronger. These contractions are sometimes difficult to tell apart from true labor because they can be very uncomfortable. You should not feel embarrassed if you go to the hospital with false labor. °Sometimes, the only way to tell if you are in true labor is for your health care provider to look for changes in the cervix. The health care provider will do a physical exam and may monitor your contractions. If you are not in true labor, the exam should show that your cervix is not dilating and your water has not broken. °If there are other health problems associated with your pregnancy, it is completely safe for you to be sent home with false labor. You may continue to have Braxton Hicks contractions until you go into true labor. °How to tell the difference between true labor and false labor °True labor °· Contractions last 30-70 seconds. °· Contractions become very regular. °· Discomfort is usually felt in the top of the uterus, and it spreads to the lower abdomen and low back. °· Contractions do not go away with walking. °· Contractions usually become more intense and increase in frequency. °· The cervix dilates and gets thinner. °False labor °· Contractions are usually shorter and not as strong as true labor contractions. °· Contractions are usually irregular. °· Contractions  are often felt in the front of the lower abdomen and in the groin. °· Contractions may go away when you walk around or change positions while lying down. °· Contractions get weaker and are shorter-lasting as time goes on. °· The cervix usually does not dilate or become thin. °Follow these instructions at home: °· Take over-the-counter and prescription medicines only as told by your health care provider. °· Keep up with your usual exercises and follow other instructions from your health care provider. °· Eat and drink lightly if you think you are going into labor. °· If Braxton Hicks contractions are making you uncomfortable: °? Change your position from lying down or resting to walking, or change from walking to resting. °? Sit and rest in a tub of warm water. °? Drink enough fluid to keep your urine pale yellow. Dehydration may cause these contractions. °? Do slow and deep breathing several times an hour. °· Keep all follow-up prenatal visits as told by your health care provider. This is important. °Contact a health care provider if: °· You have a fever. °· You have continuous pain in your abdomen. °Get help right away if: °· Your contractions become stronger, more regular, and closer together. °· You have fluid leaking or gushing from your vagina. °· You pass blood-tinged mucus (bloody show). °· You have bleeding from your vagina. °· You have low back pain that you never had before. °· You feel your baby’s head pushing down and causing pelvic pressure. °· Your baby is not moving inside you as much as it used to. °Summary °· Contractions that occur before labor are called Braxton   Hicks contractions, false labor, or practice contractions. °· Braxton Hicks contractions are usually shorter, weaker, farther apart, and less regular than true labor contractions. True labor contractions usually become progressively stronger and regular and they become more frequent. °· Manage discomfort from Braxton Hicks contractions by  changing position, resting in a warm bath, drinking plenty of water, or practicing deep breathing. °This information is not intended to replace advice given to you by your health care provider. Make sure you discuss any questions you have with your health care provider. °Document Released: 06/23/2016 Document Revised: 06/23/2016 Document Reviewed: 06/23/2016 °Elsevier Interactive Patient Education © 2018 [employer] Inc[employer] ° °

## 2017-08-23 NOTE — Progress Notes (Signed)
Pt is here for an [place] visit. Would like to be checked. States she lost her mucous plug on Sat.

## 2017-08-30 ENCOUNTER — Ambulatory Visit (INDEPENDENT_AMBULATORY_CARE_PROVIDER_SITE_OTHER): Payer: 59 | Admitting: Obstetrics and Gynecology

## 2017-08-30 ENCOUNTER — Encounter: Payer: Self-pay | Admitting: Obstetrics and Gynecology

## 2017-08-30 VITALS — BP 124/85 | HR 88 | Wt 209.4 lb

## 2017-08-30 DIAGNOSIS — Z3493 Encounter for supervision of normal pregnancy, unspecified, third trimester: Secondary | ICD-10-CM

## 2017-08-30 DIAGNOSIS — Z113 Encounter for screening for infections with a predominantly sexual mode of transmission: Secondary | ICD-10-CM

## 2017-08-30 DIAGNOSIS — Z3685 Encounter for antenatal screening for Streptococcus B: Secondary | ICD-10-CM

## 2017-08-30 LAB — POCT URINALYSIS DIPSTICK
BILIRUBIN UA: NEGATIVE
Blood, UA: NEGATIVE
Glucose, UA: NEGATIVE
Ketones, UA: NEGATIVE
LEUKOCYTES UA: NEGATIVE
Nitrite, UA: NEGATIVE
Protein, UA: POSITIVE — AB
SPEC GRAV UA: 1.015 (ref 1.010–1.025)
UROBILINOGEN UA: 0.2 U/dL
pH, UA: 6 (ref 5.0–8.0)

## 2017-08-30 NOTE — Progress Notes (Signed)
ROB-discussed labor precautions and IOL if goes over due date, IOL forms sent in and [place] informed.

## 2017-08-30 NOTE — Progress Notes (Signed)
ROB- pt is having a lot of pelvic pressure, lots of mucous

## 2017-08-30 NOTE — Addendum Note (Signed)
Addended by: Keturah Barre L on: 08/30/2017 03:22 PM   Modules accepted: Orders

## 2017-08-31 ENCOUNTER — Telehealth: Payer: Self-pay

## 2017-08-31 ENCOUNTER — Other Ambulatory Visit: Payer: Self-pay | Admitting: Obstetrics and Gynecology

## 2017-08-31 DIAGNOSIS — Z3A39 39 weeks gestation of pregnancy: Secondary | ICD-10-CM | POA: Insufficient documentation

## 2017-08-31 DIAGNOSIS — O471 False labor at or after 37 completed weeks of gestation: Secondary | ICD-10-CM

## 2017-08-31 DIAGNOSIS — O480 Post-term pregnancy: Secondary | ICD-10-CM

## 2017-08-31 NOTE — Telephone Encounter (Signed)
Message left for pt to please return my call- per AT induction scheduled for 09/07/17 at 6 AM.

## 2017-08-31 NOTE — Telephone Encounter (Signed)
Pt returned my call- per AT- induction scheduled 09/07/17 at 6AM. Dr Amalia Hailey informed.

## 2017-09-01 ENCOUNTER — Observation Stay
Admission: EM | Admit: 2017-09-01 | Discharge: 2017-09-01 | Disposition: A | Discharge status: Home / Self Care | Payer: 59 | Source: Home / Self Care | Admitting: Obstetrics and Gynecology

## 2017-09-01 ENCOUNTER — Other Ambulatory Visit: Payer: Self-pay

## 2017-09-01 DIAGNOSIS — Z862 Personal history of diseases of the blood and blood-forming organs and certain disorders involving the immune mechanism: Principal | ICD-10-CM

## 2017-09-01 DIAGNOSIS — Z9289 Personal history of other medical treatment: Secondary | ICD-10-CM

## 2017-09-01 DIAGNOSIS — Z8759 Personal history of other complications of pregnancy, childbirth and the puerperium: Secondary | ICD-10-CM

## 2017-09-01 DIAGNOSIS — Z3A39 39 weeks gestation of pregnancy: Secondary | ICD-10-CM

## 2017-09-01 DIAGNOSIS — O99019 Anemia complicating pregnancy, unspecified trimester: Secondary | ICD-10-CM

## 2017-09-01 DIAGNOSIS — O471 False labor at or after 37 completed weeks of gestation: Secondary | ICD-10-CM

## 2017-09-01 LAB — STREP GP B NAA: Strep Gp B NAA: NEGATIVE

## 2017-09-01 LAB — GC/CHLAMYDIA PROBE AMP
Chlamydia trachomatis, NAA: NEGATIVE
Neisseria gonorrhoeae by PCR: NEGATIVE

## 2017-09-01 NOTE — OB Triage Note (Signed)
Pt arrival to triage with c/o contractions every 5-52min worsening an hour and a half ago.  Pt denies vaginal bleeding, LOF and is feeling baby move normally.  EFM and toco applied and assessing.

## 2017-09-01 NOTE — Discharge Summary (Signed)
Obstetric Discharge Summary  Patient ID: Sarah Haney MRN: [id number] DOB/AGE: 1996-01-19 22 y.o.   Date of Admission: 09/01/2017 Dani Gobble, CNM Waynetta Pean, MD)  Date of Discharge: 09/01/2017 Dani Gobble, CNM Waynetta Pean, MD)  Admitting Diagnosis: Observation at [redacted]w[redacted]d  Secondary Diagnosis: RH negative status     Discharge Diagnosis: No other diagnosis   Antepartum Procedures: NST    Brief Hospital Course   L&D OB Triage Note  Sarah Haney is a 21 y.o. G24P1001 female at [redacted]w[redacted]d, EDD Estimated Date of Delivery: 09/02/17 who presented to triage for complaints of uterine contractions.  She was evaluated by the nurses with no significant findings for labor or fetal distress. Vital signs stable. An NST was performed and has been reviewed by CNM.   NST INTERPRETATION: Indications: rule out uterine contractions  Mode: External(monitors d/c'd for discharge) Baseline Rate (A): 130 bpm(fht 130) Variability: Moderate Accelerations: 15 x 15 Decelerations: None Contraction Frequency (min): none noted  Impression: reactive  Dilation: 3 Effacement (%): 70 Exam by:: P Funk RN  Plan: NST performed was reviewed and was found to be reactive. She was discharged home with bleeding/labor precautions.  Continue routine prenatal care. Follow up with CNM as previously scheduled.   Discharge Instructions: Per After Visit Summary.  Activity: Refer to After Visit Summary.  Diet: Regular  Medications:  Allergies as of 09/01/2017   No Known Allergies     Medication List    ASK your doctor about these medications   acetaminophen 500 MG tablet Commonly known as:  TYLENOL Take 500 mg by mouth every 6 (six) hours as needed for mild pain. Reported on 06/02/2015   CITRANATAL BLOOM 90-1 MG Tabs Take 1 tablet by mouth daily.   pantoprazole 40 MG tablet Commonly known as:  PROTONIX Take 1 tablet (40 mg total) by mouth daily.   ranitidine 150 MG tablet Commonly known as:   ZANTAC Take 150 mg by mouth 2 (two) times daily.      Outpatient follow up:  Follow-up Information    [facility]. Go to.   Why:  Keep regularly scheduled appointments Contact information: [address]  [place] [phone]         Postpartum contraception: condoms  Discharged Condition: stable  Discharged to: home   Diona Fanti, CNM [facility], [facility]

## 2017-09-01 NOTE — OB Triage Note (Signed)
Discharge instructions provided and reviewed.  Follow up care discussed.  Pt Verbalizes understanding.

## 2017-09-02 ENCOUNTER — Inpatient Hospital Stay: Admit: 2017-09-02 | Payer: 59

## 2017-09-03 ENCOUNTER — Inpatient Hospital Stay: Payer: 59 | Admitting: Anesthesiology

## 2017-09-03 ENCOUNTER — Encounter: Payer: Self-pay | Admitting: *Deleted

## 2017-09-03 ENCOUNTER — Inpatient Hospital Stay
Admission: EM | Admit: 2017-09-03 | Discharge: 2017-09-06 | DRG: 807 | Disposition: A | Discharge status: Home / Self Care | Payer: 59 | Attending: Certified Nurse Midwife | Admitting: Certified Nurse Midwife

## 2017-09-03 ENCOUNTER — Other Ambulatory Visit: Payer: Self-pay

## 2017-09-03 DIAGNOSIS — O99019 Anemia complicating pregnancy, unspecified trimester: Secondary | ICD-10-CM

## 2017-09-03 DIAGNOSIS — Z8759 Personal history of other complications of pregnancy, childbirth and the puerperium: Secondary | ICD-10-CM

## 2017-09-03 DIAGNOSIS — O368390 Maternal care for abnormalities of the fetal heart rate or rhythm, unspecified trimester, not applicable or unspecified: Secondary | ICD-10-CM | POA: Diagnosis not present

## 2017-09-03 DIAGNOSIS — Z3A40 40 weeks gestation of pregnancy: Secondary | ICD-10-CM | POA: Diagnosis not present

## 2017-09-03 DIAGNOSIS — O360130 Maternal care for anti-D [Rh] antibodies, third trimester, not applicable or unspecified: Secondary | ICD-10-CM | POA: Diagnosis not present

## 2017-09-03 DIAGNOSIS — Z862 Personal history of diseases of the blood and blood-forming organs and certain disorders involving the immune mechanism: Secondary | ICD-10-CM

## 2017-09-03 DIAGNOSIS — Z9289 Personal history of other medical treatment: Secondary | ICD-10-CM

## 2017-09-03 DIAGNOSIS — Z3483 Encounter for supervision of other normal pregnancy, third trimester: Secondary | ICD-10-CM | POA: Diagnosis present

## 2017-09-03 DIAGNOSIS — O480 Post-term pregnancy: Secondary | ICD-10-CM | POA: Diagnosis not present

## 2017-09-03 LAB — CBC
HEMATOCRIT: 36.8 % (ref 35.0–47.0)
HEMOGLOBIN: 12.2 g/dL (ref 12.0–16.0)
MCH: 30.7 pg (ref 26.0–34.0)
MCHC: 33.3 g/dL (ref 32.0–36.0)
MCV: 92.4 fL (ref 80.0–100.0)
Platelets: 208 10*3/uL (ref 150–440)
RBC: 3.98 MIL/uL (ref 3.80–5.20)
RDW: 20.1 % — ABNORMAL HIGH (ref 11.5–14.5)
WBC: 12.1 10*3/uL — AB (ref 3.6–11.0)

## 2017-09-03 MED ORDER — MISOPROSTOL 200 MCG PO TABS
ORAL_TABLET | ORAL | Status: AC
  Administered 2017-09-04: 1000 ug via RECTAL
  Filled 2017-09-03: qty 4

## 2017-09-03 MED ORDER — LIDOCAINE HCL (PF) 1 % IJ SOLN
30.0000 mL | INTRAMUSCULAR | Status: AC | PRN
  Administered 2017-09-03: 1.5 mL via SUBCUTANEOUS

## 2017-09-03 MED ORDER — OXYTOCIN 40 UNITS IN LACTATED RINGERS INFUSION - SIMPLE MED
2.5000 [IU]/h | INTRAVENOUS | Status: DC
  Administered 2017-09-04: 2.5 [IU]/h via INTRAVENOUS

## 2017-09-03 MED ORDER — OXYTOCIN 10 UNIT/ML IJ SOLN
INTRAMUSCULAR | Status: AC
  Filled 2017-09-03: qty 2

## 2017-09-03 MED ORDER — PHENYLEPHRINE 40 MCG/ML (10ML) SYRINGE FOR IV PUSH (FOR BLOOD PRESSURE SUPPORT): 80.0000 ug | PREFILLED_SYRINGE | INTRAVENOUS | Status: DC | PRN

## 2017-09-03 MED ORDER — LIDOCAINE-EPINEPHRINE (PF) 1.5 %-1:200000 IJ SOLN
INTRAMUSCULAR | Status: DC | PRN
  Administered 2017-09-03: 3 mL via EPIDURAL

## 2017-09-03 MED ORDER — FENTANYL 2.5 MCG/ML W/ROPIVACAINE 0.15% IN NS 100 ML EPIDURAL (ARMC)
EPIDURAL | Status: DC | PRN
  Administered 2017-09-03: 12 mL/h via EPIDURAL

## 2017-09-03 MED ORDER — OXYCODONE-ACETAMINOPHEN 5-325 MG PO TABS: 2.0000 | ORAL_TABLET | ORAL | Status: DC | PRN

## 2017-09-03 MED ORDER — LACTATED RINGERS IV SOLN: 500.0000 mL | INTRAVENOUS | Status: DC | PRN

## 2017-09-03 MED ORDER — LACTATED RINGERS IV SOLN: 500.0000 mL | Freq: Once | INTRAVENOUS | Status: DC

## 2017-09-03 MED ORDER — SOD CITRATE-CITRIC ACID 500-334 MG/5ML PO SOLN: 30.0000 mL | ORAL | Status: DC | PRN

## 2017-09-03 MED ORDER — ONDANSETRON HCL 4 MG/2ML IJ SOLN
4.0000 mg | Freq: Four times a day (QID) | INTRAMUSCULAR | Status: DC | PRN
  Administered 2017-09-03: 4 mg via INTRAVENOUS
  Filled 2017-09-03: qty 2

## 2017-09-03 MED ORDER — OXYCODONE-ACETAMINOPHEN 5-325 MG PO TABS: 1.0000 | ORAL_TABLET | ORAL | Status: DC | PRN

## 2017-09-03 MED ORDER — OXYTOCIN BOLUS FROM INFUSION
500.0000 mL | Freq: Once | INTRAVENOUS | Status: AC
  Administered 2017-09-04: 500 mL via INTRAVENOUS

## 2017-09-03 MED ORDER — LIDOCAINE HCL (PF) 1 % IJ SOLN
INTRAMUSCULAR | Status: AC
  Filled 2017-09-03: qty 30

## 2017-09-03 MED ORDER — FENTANYL 2.5 MCG/ML W/ROPIVACAINE 0.15% IN NS 100 ML EPIDURAL (ARMC)
12.0000 mL/h | EPIDURAL | Status: DC
  Administered 2017-09-03: 12 mL/h via EPIDURAL
  Filled 2017-09-03: qty 100

## 2017-09-03 MED ORDER — BUTORPHANOL TARTRATE 2 MG/ML IJ SOLN
1.0000 mg | INTRAMUSCULAR | Status: DC | PRN
Start: 2017-09-03 — End: 2017-09-04

## 2017-09-03 MED ORDER — EPHEDRINE 5 MG/ML INJ: 10.0000 mg | INTRAVENOUS | Status: DC | PRN

## 2017-09-03 MED ORDER — TERBUTALINE SULFATE 1 MG/ML IJ SOLN: 0.2500 mg | Freq: Once | INTRAMUSCULAR | Status: DC | PRN

## 2017-09-03 MED ORDER — PHENYLEPHRINE 40 MCG/ML (10ML) SYRINGE FOR IV PUSH (FOR BLOOD PRESSURE SUPPORT)
80.0000 ug | PREFILLED_SYRINGE | INTRAVENOUS | Status: DC | PRN
Start: 2017-09-03 — End: 2017-09-04

## 2017-09-03 MED ORDER — FENTANYL 2.5 MCG/ML W/ROPIVACAINE 0.15% IN NS 100 ML EPIDURAL (ARMC)
EPIDURAL | Status: AC
  Filled 2017-09-03: qty 100

## 2017-09-03 MED ORDER — OXYTOCIN 40 UNITS IN LACTATED RINGERS INFUSION - SIMPLE MED
1.0000 m[IU]/min | INTRAVENOUS | Status: DC
  Administered 2017-09-04: 2 m[IU]/min via INTRAVENOUS
  Filled 2017-09-03: qty 1000

## 2017-09-03 MED ORDER — DIPHENHYDRAMINE HCL 50 MG/ML IJ SOLN: 12.5000 mg | INTRAMUSCULAR | Status: DC | PRN

## 2017-09-03 MED ORDER — AMMONIA AROMATIC IN INHA
RESPIRATORY_TRACT | Status: AC
  Filled 2017-09-03: qty 10

## 2017-09-03 MED ORDER — ACETAMINOPHEN 325 MG PO TABS: 650.0000 mg | ORAL_TABLET | ORAL | Status: DC | PRN

## 2017-09-03 MED ORDER — LACTATED RINGERS IV SOLN
INTRAVENOUS | Status: DC
  Administered 2017-09-03: 18:00:00 via INTRAVENOUS

## 2017-09-03 NOTE — Progress Notes (Signed)
Patient ID: Sarah Haney, female   DOB: [birth year], 22 y.o.   MRN: [id number]  Sarah Haney is a 22 y.o. G2P1001 at [redacted]w[redacted]d by ultrasound admitted for active labor.   Subjective:  Doing well, reports pain relief since epidural placement. Desires labor without augmentation at this time. Family members at bedside.   Denies difficulty breathing or respiratory distress, chest pain, abdominal pain, vaginal bleeding, dysuria, and leg pain.   Objective:  Temp:  [97.7 F (36.5 C)] 97.7 F (36.5 C) (07/14 1234) Pulse Rate:  [112-211] 122 (07/14 1727) Resp:  [16] 16 (07/14 1234) BP: (112-132)/(44-89) 114/70 (07/14 1727) SpO2:  [100 %] 100 % (07/14 1715) Weight:  [209 lb (94.8 kg)] 209 lb (94.8 kg) (07/14 1234)  Fetal Wellbeing:  Category I  UC:   regular, every two (2) to four (4) minutes, soft resting tone  SVE:   Dilation: 6 Effacement (%): 80 Station: -1 Exam by:: Lawhorn CNM  Labs: Lab Results  Component Value Date   WBC 12.1 (H) 09/03/2017   HGB 12.2 09/03/2017   HCT 36.8 09/03/2017   MCV 92.4 09/03/2017   PLT 208 09/03/2017    Assessment:  Sarah Haney is a 22 y.o. G2P1001 at [redacted]w[redacted]d being admitted for labor, Rh negative, GBS negative, History postpartum hemorrhage  FHR Category I  Plan:  Encouraged position change and use of peanut call.   Continue orders as written. Reassess as needed.    Diona Fanti, CNM [facility], [facility] 09/03/2017, 6:48 PM
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
## 2017-09-03 NOTE — OB Triage Note (Signed)
Patient to OBS3 for complaints of contractions since 3am that left her unable to sleep.  She rates her pain 6/10 during a contraction.  She states that she has had blood tinged mucus discharge. No LOF.

## 2017-09-03 NOTE — Anesthesia Procedure Notes (Signed)
Epidural Patient location during procedure: OB Start time: 09/03/2017 5:00 PM End time: 09/03/2017 5:27 PM  Staffing Performed: anesthesiologist   Preanesthetic Checklist Completed: patient identified, site marked, surgical consent, pre-op evaluation, timeout performed, IV checked, risks and benefits discussed and monitors and equipment checked  Epidural Patient position: sitting Prep: Betadine Patient monitoring: heart rate, continuous pulse ox and blood pressure Approach: midline Location: L4-L5 Injection technique: LOR saline  Needle:  Needle type: Tuohy  Needle gauge: 17 G Needle length: 9 cm and 9 Needle insertion depth: 8 cm Catheter type: closed end flexible Catheter size: 20 Guage Catheter at skin depth: 12 cm Test dose: negative and 1.5% lidocaine with Epi 1:200 K  Assessment Events: blood not aspirated, injection not painful, no injection resistance, negative IV test and no paresthesia  Additional Notes   Patient tolerated the insertion well without complications.Reason for block:procedure for pain

## 2017-09-03 NOTE — Progress Notes (Addendum)
Patient ID: Sarah Haney, female   DOB: 12/17/1995, 22 y.o.   MRN: [id number]  Sarah Haney is a 22 y.o. G2P1001 at [redacted]w[redacted]d by ultrasound admitted for active labor  Subjective:  Doing well, reports shivering. Would like to have water bag opened at this time. Family members at bedside.   Denies difficulty breathing or respiratory distress, chest pain, abdominal pain, vaginal bleeding, dysuria, and leg pain or swelling.    Objective:  Temp:  [97.7 F (36.5 C)-98.8 F (37.1 C)] 98.8 F (37.1 C) (07/14 2143) Pulse Rate:  [99-211] 99 (07/14 2143) Resp:  [16] 16 (07/14 2143) BP: (112-132)/(44-89) 112/62 (07/14 2143) SpO2:  [98 %-100 %] 100 % (07/14 2316) Weight:  [209 lb (94.8 kg)] 209 lb (94.8 kg) (07/14 1234)  Fetal Wellbeing:  Category I  UC:   regular, every five (5) minutes, soft resting tone  SVE:   Dilation: 7.5 Effacement (%): 100 Station: -1 Exam by:: Tavarus Poteete  AROM: clear fluid, moderate amount  Labs: Lab Results  Component Value Date   WBC 12.1 (H) 09/03/2017   HGB 12.2 09/03/2017   HCT 36.8 09/03/2017   MCV 92.4 09/03/2017   PLT 208 09/03/2017    Assessment:  Sarah Haney a 22 y.o.G2P1001 at [redacted]w[redacted]d being admitted for labor, Rh negative, GBS negative, History postpartum hemorrhage  FHR Category I  Plan:  AROM without difficulty. Patient agrees to start pitocin in 45 minutes if contractions continue to space out.   Continue orders as written. Reassess as needed.    Diona Fanti, CNM [facility], [facility] 09/03/2017, 11:20 PM

## 2017-09-03 NOTE — Anesthesia Preprocedure Evaluation (Signed)
Anesthesia Evaluation  Patient identified by MRN, date of birth, ID band Patient awake    Reviewed: Allergy & Precautions, NPO status , Patient's Chart, lab work & pertinent test results  History of Anesthesia Complications Negative for: history of anesthetic complications  Airway Mallampati: II       Dental   Pulmonary neg sleep apnea, neg COPD,           Cardiovascular (-) hypertension(-) Past MI and (-) CHF (-) dysrhythmias (-) Valvular Problems/Murmurs     Neuro/Psych neg Seizures Scoliosis    GI/Hepatic Neg liver ROS, GERD (with pregnancy)  ,  Endo/Other  neg diabetes  Renal/GU negative Renal ROS     Musculoskeletal   Abdominal   Peds  Hematology  (+) anemia ,   Anesthesia Other Findings   Reproductive/Obstetrics                             Anesthesia Physical Anesthesia Plan  ASA: II  Anesthesia Plan: Epidural   Post-op Pain Management:    Induction:   PONV Risk Score and Plan:   Airway Management Planned:   Additional Equipment:   Intra-op Plan:   Post-operative Plan:   Informed Consent: I have reviewed the patients History and Physical, chart, labs and discussed the procedure including the risks, benefits and alternatives for the proposed anesthesia with the patient or authorized representative who has indicated his/her understanding and acceptance.     Plan Discussed with:   Anesthesia Plan Comments:         Anesthesia Quick Evaluation

## 2017-09-04 ENCOUNTER — Encounter: Payer: Self-pay | Admitting: *Deleted

## 2017-09-04 ENCOUNTER — Other Ambulatory Visit: Payer: 59

## 2017-09-04 ENCOUNTER — Encounter: Payer: 59 | Admitting: Certified Nurse Midwife

## 2017-09-04 DIAGNOSIS — O368390 Maternal care for abnormalities of the fetal heart rate or rhythm, unspecified trimester, not applicable or unspecified: Secondary | ICD-10-CM

## 2017-09-04 DIAGNOSIS — O480 Post-term pregnancy: Secondary | ICD-10-CM

## 2017-09-04 DIAGNOSIS — O360130 Maternal care for anti-D [Rh] antibodies, third trimester, not applicable or unspecified: Secondary | ICD-10-CM

## 2017-09-04 MED ORDER — METHYLERGONOVINE MALEATE 0.2 MG PO TABS
0.2000 mg | ORAL_TABLET | ORAL | Status: DC | PRN
  Filled 2017-09-04: qty 1

## 2017-09-04 MED ORDER — SODIUM CHLORIDE 0.9% FLUSH
3.0000 mL | Freq: Two times a day (BID) | INTRAVENOUS | Status: DC
  Administered 2017-09-05: 3 mL via INTRAVENOUS

## 2017-09-04 MED ORDER — IBUPROFEN 600 MG PO TABS
ORAL_TABLET | ORAL | Status: AC
Start: 2017-09-04 — End: 2017-09-04
  Filled 2017-09-04: qty 1

## 2017-09-04 MED ORDER — SENNOSIDES-DOCUSATE SODIUM 8.6-50 MG PO TABS
2.0000 | ORAL_TABLET | ORAL | Status: DC
  Administered 2017-09-04 – 2017-09-05 (×2): 2 via ORAL
  Filled 2017-09-04 (×2): qty 2

## 2017-09-04 MED ORDER — SIMETHICONE 80 MG PO CHEW: 80.0000 mg | CHEWABLE_TABLET | ORAL | Status: DC | PRN

## 2017-09-04 MED ORDER — SENNOSIDES-DOCUSATE SODIUM 8.6-50 MG PO TABS: 2.0000 | ORAL_TABLET | ORAL | Status: DC

## 2017-09-04 MED ORDER — BENZOCAINE-MENTHOL 20-0.5 % EX AERO
1.0000 "application " | INHALATION_SPRAY | CUTANEOUS | Status: DC | PRN
  Administered 2017-09-04: 1 via TOPICAL
  Filled 2017-09-04: qty 56

## 2017-09-04 MED ORDER — COCONUT OIL OIL
1.0000 "application " | TOPICAL_OIL | Status: DC | PRN
  Administered 2017-09-04: 1 via TOPICAL
  Filled 2017-09-04: qty 120

## 2017-09-04 MED ORDER — METHYLERGONOVINE MALEATE 0.2 MG/ML IJ SOLN: 0.2000 mg | INTRAMUSCULAR | Status: DC | PRN

## 2017-09-04 MED ORDER — DIPHENHYDRAMINE HCL 25 MG PO CAPS
25.0000 mg | ORAL_CAPSULE | Freq: Four times a day (QID) | ORAL | Status: DC | PRN
Start: 2017-09-04 — End: 2017-09-06

## 2017-09-04 MED ORDER — PRENATAL MULTIVITAMIN CH
1.0000 | ORAL_TABLET | Freq: Every day | ORAL | Status: DC
  Administered 2017-09-04 – 2017-09-06 (×3): 1 via ORAL
  Filled 2017-09-04 (×3): qty 1

## 2017-09-04 MED ORDER — OXYTOCIN 40 UNITS IN LACTATED RINGERS INFUSION - SIMPLE MED
INTRAVENOUS | Status: AC
  Filled 2017-09-04: qty 1000

## 2017-09-04 MED ORDER — SODIUM CHLORIDE 0.9% FLUSH: 3.0000 mL | INTRAVENOUS | Status: DC | PRN

## 2017-09-04 MED ORDER — ONDANSETRON HCL 4 MG PO TABS
4.0000 mg | ORAL_TABLET | ORAL | Status: DC | PRN
  Administered 2017-09-04: 4 mg via ORAL
  Filled 2017-09-04: qty 1

## 2017-09-04 MED ORDER — HYDROMORPHONE HCL 2 MG PO TABS
2.0000 mg | ORAL_TABLET | Freq: Once | ORAL | Status: AC
  Administered 2017-09-04: 2 mg via ORAL
  Filled 2017-09-04: qty 1

## 2017-09-04 MED ORDER — DIBUCAINE 1 % RE OINT: 1.0000 "application " | TOPICAL_OINTMENT | RECTAL | Status: DC | PRN

## 2017-09-04 MED ORDER — IBUPROFEN 600 MG PO TABS
600.0000 mg | ORAL_TABLET | Freq: Four times a day (QID) | ORAL | Status: DC
  Administered 2017-09-04 – 2017-09-06 (×10): 600 mg via ORAL
  Filled 2017-09-04 (×9): qty 1

## 2017-09-04 MED ORDER — ACETAMINOPHEN 325 MG PO TABS
650.0000 mg | ORAL_TABLET | ORAL | Status: DC | PRN
  Administered 2017-09-04 – 2017-09-05 (×8): 650 mg via ORAL
  Filled 2017-09-04 (×8): qty 2

## 2017-09-04 MED ORDER — SODIUM CHLORIDE 0.9 % IV SOLN: 250.0000 mL | INTRAVENOUS | Status: DC | PRN

## 2017-09-04 MED ORDER — ONDANSETRON HCL 4 MG/2ML IJ SOLN: 4.0000 mg | INTRAMUSCULAR | Status: DC | PRN

## 2017-09-04 MED ORDER — WITCH HAZEL-GLYCERIN EX PADS: 1.0000 "application " | MEDICATED_PAD | CUTANEOUS | Status: DC | PRN

## 2017-09-04 NOTE — Lactation Note (Signed)
This note was copied from a baby's chart. Lactation Consultation Note  Patient Name: Sarah Haney Today's Date: 09/04/2017     Maternal Data  Did not breastfeed her first  Child, had a c/s and PPH and did not feel well,  Baby not opening mouth well to latch t o breast but after several attempts baby did latch to breast and nursed 25 min with swallows heard, encourage to attempt with both breasts but mom stated baby fell asleep   Feeding   LATCH Score                   Interventions    Lactation Tools Discussed/Used     Consult Status      Ferol Luz 09/04/2017, 6:30 PM

## 2017-09-04 NOTE — Lactation Note (Signed)
This note was copied from a baby's chart. Lactation Consultation Note  Patient Name: Girl Kaila Devries WYSHU'O Date: 09/04/2017 Reason for consult: Follow-up assessment   Maternal Data    Feeding Feeding Type: Breast Fed Length of feed: (right breast sidelying)  LATCH Score Latch: Repeated attempts needed to sustain latch, nipple held in mouth throughout feeding, stimulation needed to elicit sucking reflex.(gaggy)  Audible Swallowing: Spontaneous and intermittent  Type of Nipple: Everted at rest and after stimulation  Comfort (Breast/Nipple): Soft / non-tender  Hold (Positioning): Assistance needed to correctly position infant at breast and maintain latch.  LATCH Score: 8  Interventions Interventions: Assisted with latch;Skin to skin;Hand express;Breast compression;Adjust position;Support pillows;Position options  Lactation Tools Discussed/Used     Consult Status Consult Status: Follow-up Date: 09/05/17 Follow-up type: In-patient    Ferol Luz 09/04/2017, 7:39 PM

## 2017-09-04 NOTE — Progress Notes (Signed)
Pt. HR does decrease when she is resting at 102-111. Assisted up to Bathroom and Pt. HR increased to 124 but decreased when back in bed. Pt. Denies syncope and/or SOB when ambulating. Appears comfortable and in NAD.

## 2017-09-04 NOTE — Plan of Care (Signed)
Pt. Transferred to room 345 PP. Pt. Oriented to room. Educated in Pharmacologist, Surveyor, quantity and security as well as Safe Sleep for her Infant. Mother v/o. HR was 257-505 and loud Systolic murmur noted. CNM notified and no new orders received. Pt. Placed on CR Monitor and HR decreased while Pt. Rested. Will cont. To monitor.

## 2017-09-04 NOTE — Progress Notes (Signed)
Diego Cory CNM notified of Pt. HR of 125-127 with very loud Murmur. I also reported that Pt. States she had a murmur diagnosed as a child and that she had "out-grown" this. Pt. Is alert and oriented with pleasant affect. Color good, skin w&d. BBS clear. Pos. Pedal pulses equal and strong with cap. Refill < 2 sec. Pt. Denies syncope or SOB. Will place Pt. On CR monitor so that I can monitor her HR closely. M. Lawhorn stated Pt. HR decreases when she is resting and there is not any medical staff present.

## 2017-09-05 LAB — BPAM RBC
BLOOD PRODUCT EXPIRATION DATE: 201908072359
Blood Product Expiration Date: 201908072359
Unit Type and Rh: 9500
Unit Type and Rh: 9500

## 2017-09-05 LAB — TYPE AND SCREEN
ABO/RH(D): O NEG
Antibody Screen: POSITIVE
UNIT DIVISION: 0
UNIT DIVISION: 0

## 2017-09-05 LAB — CBC
HEMATOCRIT: 34.5 % — AB (ref 35.0–47.0)
Hemoglobin: 11.6 g/dL — ABNORMAL LOW (ref 12.0–16.0)
MCH: 31 pg (ref 26.0–34.0)
MCHC: 33.5 g/dL (ref 32.0–36.0)
MCV: 92.4 fL (ref 80.0–100.0)
Platelets: 204 10*3/uL (ref 150–440)
RBC: 3.73 MIL/uL — ABNORMAL LOW (ref 3.80–5.20)
RDW: 20.3 % — AB (ref 11.5–14.5)
WBC: 11.6 10*3/uL — AB (ref 3.6–11.0)

## 2017-09-05 LAB — RPR: RPR: NONREACTIVE

## 2017-09-05 MED ORDER — CYCLOBENZAPRINE HCL 10 MG PO TABS
5.0000 mg | ORAL_TABLET | Freq: Three times a day (TID) | ORAL | Status: DC | PRN
  Administered 2017-09-05 (×3): 5 mg via ORAL
  Filled 2017-09-05 (×5): qty 0.5

## 2017-09-05 NOTE — Lactation Note (Signed)
This note was copied from a baby's chart. Lactation Consultation Note  Patient Name: Girl Shaughnessy Gethers Today's Date: 09/05/2017    During [place] rounds this morning, Mom said she was giving formula "to make sure baby had enough". Basic BF info reviewed and expected volumes and feeding behavior. Mom has [place] contact info and encouraged to call if she wants help with breastfeeding or answering any other questions. Mom was very sleepy at the time.    Maternal Data    Feeding Feeding Type: Bottle Fed - Formula Nipple Type: Slow - flow  LATCH Score                   Interventions    Lactation Tools Discussed/Used     Consult Status      Roque Cash 09/05/2017, 2:46 PM

## 2017-09-05 NOTE — Anesthesia Postprocedure Evaluation (Signed)
Anesthesia Post Note  Patient: Sarah Haney  Procedure(s) Performed: AN AD HOC LABOR EPIDURAL  Patient location during evaluation: Mother Baby Anesthesia Type: Epidural Level of consciousness: awake and alert Pain management: pain level controlled Vital Signs Assessment: post-procedure vital signs reviewed and stable Respiratory status: spontaneous breathing, nonlabored ventilation and respiratory function stable Cardiovascular status: stable Postop Assessment: no headache, no backache and epidural receding Anesthetic complications: no     Last Vitals:  Vitals:   09/04/17 2240 09/05/17 0727  BP: 131/83 104/62  Pulse: 86 72  Resp: 18 18  Temp:  36.4 C  SpO2:  98%    Last Pain:  Vitals:   09/05/17 0824  TempSrc:   PainSc: 5                  Alison Stalling

## 2017-09-05 NOTE — Progress Notes (Signed)
Patient ID: Sarah Haney, female   DOB: 13-Feb-1996, 22 y.o.   MRN: [id number]  Post Partum Day # 1, s/p SVD  Subjective:  Doing well, overall. Reports generalized soreness despite Motrin and Tylenol. Feeding infant. FOB sleep at bedside.   Denies difficulty breathing or respiratory distress, chest pain, abdominal pain, excessive vaginal bleeding, dysuria, and leg pain or swelling.   Objective:  Temp:  [97.9 F (36.6 C)-98.6 F (37 C)] 97.9 F (36.6 C) (07/15 2238) Pulse Rate:  [66-86] 86 (07/15 2240) Resp:  [18] 18 (07/15 2240) BP: (91-132)/(54-88) 131/83 (07/15 2240) SpO2:  [83 %-99 %] 83 % (07/15 2238)  Physical Exam:   General: alert and cooperative   Lungs: clear to auscultation bilaterally  Breasts: deffered, no complaints  Heart: normal apical impulse  Abdomen: soft, non-tender; bowel sounds normal; no masses,  no organomegaly  Pelvis: Lochia: appropriate, Uterine Fundus: firm  Extremities: DVT Evaluation: No evidence of DVT seen on physical exam. Negative Homan's sign.  Recent Labs    09/03/17 1521 09/05/17 0539  HGB 12.2 11.6*  HCT 36.8 34.5*    Assessment/Plan: Plan for discharge tomorrow, Breastfeeding and Lactation consult   LOS: 2 days    Diona Fanti, CNM [facility] 09/05/2017 7:10 AM

## 2017-09-06 MED ORDER — CYCLOBENZAPRINE HCL 5 MG PO TABS: 5.0000 mg | ORAL_TABLET | Freq: Three times a day (TID) | ORAL | 0 refills | Status: DC | PRN

## 2017-09-06 MED ORDER — IBUPROFEN 600 MG PO TABS: 600.0000 mg | ORAL_TABLET | Freq: Four times a day (QID) | ORAL | 0 refills | Status: DC

## 2017-09-06 NOTE — Progress Notes (Addendum)
D/C instructions provided, pt states understanding, aware of follow up appt.  Pt request to eat lunch and then go home.

## 2017-09-06 NOTE — Progress Notes (Signed)
D/C home to car via auxiliary in wheelchair.

## 2017-09-06 NOTE — Discharge Instructions (Signed)
Home Care Instructions for Mom °ACTIVITY °· Gradually return to your regular activities. °· Let yourself rest. Nap while your baby sleeps. °· Avoid lifting anything that is heavier than 10 lb (4.5 kg) until your health care provider says it is okay. °· Avoid activities that take a lot of effort and energy (are strenuous) until approved by your health care provider. Walking at a slow-to-moderate pace is usually safe. °· If you had a cesarean delivery: °? Do not vacuum, climb stairs, or drive a car for 4-6 weeks. °? Have someone help you at home until you feel like you can do your usual activities yourself. °? Do exercises as told by your health care provider, if this applies. ° °VAGINAL BLEEDING °You may continue to bleed for 4-6 weeks after delivery. Over time, the amount of blood usually decreases and the color of the blood usually gets lighter. However, the flow of bright red blood may increase if you have been too active. If you need to use more than one pad in an hour because your pad gets soaked, or if you pass a large clot: °· Lie down. °· Raise your feet. °· Place a cold compress on your lower abdomen. °· Rest. °· Call your health care provider. ° °If you are breastfeeding, your period should return anytime between 8 weeks after delivery and the time that you stop breastfeeding. If you are not breastfeeding, your period should return 6-8 weeks after delivery. °PERINEAL CARE °The perineal area, or perineum, is the part of your body between your thighs. After delivery, this area needs special care. Follow these instructions as told by your health care provider. °· Take warm tub baths for 15-20 minutes. °· Use medicated pads and pain-relieving sprays and creams as told. °· Do not use tampons or douches until vaginal bleeding has stopped. °· Each time you go to the bathroom: °? Use a peri bottle. °? Change your pad. °? Use towelettes in place of toilet paper until your stitches have healed. °· Do Kegel exercises  every day. Kegel exercises help to maintain the muscles that support the vagina, bladder, and bowels. You can do these exercises while you are standing, sitting, or lying down. To do Kegel exercises: °? Tighten the muscles of your abdomen and the muscles that surround your birth canal. °? Hold for a few seconds. °? Relax. °? Repeat until you have done this 5 times in a row. °· To prevent hemorrhoids from developing or getting worse: °? Drink enough fluid to keep your urine clear or pale yellow. °? Avoid straining when having a bowel movement. °? Take over-the-counter medicines and stool softeners as told by your health care provider. ° °BREAST CARE °· Wear a tight-fitting bra. °· Avoid taking over-the-counter pain medicine for breast discomfort. °· Apply ice to the breasts to help with discomfort as needed: °? Put ice in a plastic bag. °? Place a towel between your skin and the bag. °? Leave the ice on for 20 minutes or as told by your health care provider. ° °NUTRITION °· Eat a well-balanced diet. °· Do not try to lose weight quickly by cutting back on calories. °· Take your prenatal vitamins until your postpartum checkup or until your health care provider tells you to stop. ° °POSTPARTUM DEPRESSION °You may find yourself crying for no apparent reason and unable to cope with all of the changes that come with having a newborn. This mood is called postpartum depression. Postpartum depression happens because your hormone   levels change after delivery. If you have postpartum depression, get support from your partner, friends, and family. If the depression does not go away on its own after several weeks, contact your health care provider. BREAST SELF-EXAM Do a breast self-exam each month, at the same time of the month. If you are breastfeeding, check your breasts just after a feeding, when your breasts are less full. If you are breastfeeding and your period has started, check your breasts on day 5, 6, or 7 of your  period. Report any lumps, bumps, or discharge to your health care provider. Know that breasts are normally lumpy if you are breastfeeding. This is temporary, and it is not a health risk. INTIMACY AND SEXUALITY Avoid sexual activity for at least 3-4 weeks after delivery or until the brownish-red vaginal flow is completely gone. If you want to avoid pregnancy, use some form of birth control. You can get pregnant after delivery, even if you have not had your period. SEEK MEDICAL CARE IF:  You feel unable to cope with the changes that a child brings to your life, and these feelings do not go away after several weeks.  You notice a lump, a bump, or discharge on your breast.  SEEK IMMEDIATE MEDICAL CARE IF:  Blood soaks your pad in 1 hour or less.  You have: ? Severe pain or cramping in your lower abdomen. ? A bad-smelling vaginal discharge. ? A fever that is not controlled by medicine. ? A fever, and an area of your breast is red and sore. ? Pain or redness in your calf. ? Sudden, severe chest pain. ? Shortness of breath. ? Painful or bloody urination. ? Problems with your vision.  You vomit for 12 hours or longer.  You develop a severe headache.  You have serious thoughts about hurting yourself, your child, or anyone else.  This information is not intended to replace advice given to you by your health care provider. Make sure you discuss any questions you have with your health care provider. Document Released: 02/05/2000 Document Revised: 07/16/2015 Document Reviewed: 08/11/2014 Elsevier Interactive Patient Education  2017 [employer]. Postpartum Care After Vaginal Delivery The period of time right after you deliver your newborn is called the postpartum period. What kind of medical care will I receive?  You may continue to receive fluids and medicines through an IV tube inserted into one of your veins.  If an incision was made near your vagina (episiotomy) or if you had some  vaginal tearing during delivery, cold compresses may be placed on your episiotomy or your tear. This helps to reduce pain and swelling.  You may be given a squirt bottle to use when you go to the bathroom. You may use this until you are comfortable wiping as usual. To use the squirt bottle, follow these steps: ? Before you urinate, fill the squirt bottle with warm water. Do not use hot water. ? After you urinate, while you are sitting on the toilet, use the squirt bottle to rinse the area around your urethra and vaginal opening. This rinses away any urine and blood. ? You may do this instead of wiping. As you start healing, you may use the squirt bottle before wiping yourself. Make sure to wipe gently. ? Fill the squirt bottle with clean water every time you use the bathroom.  You will be given sanitary pads to wear. How can I expect to feel?  You may not feel the need to urinate for several hours  after delivery.  You will have some soreness and pain in your abdomen and vagina.  If you are breastfeeding, you may have uterine contractions every time you breastfeed for up to several weeks postpartum. Uterine contractions help your uterus return to its normal size.  It is normal to have vaginal bleeding (lochia) after delivery. The amount and appearance of lochia is often similar to a menstrual period in the first week after delivery. It will gradually decrease over the next few weeks to a dry, yellow-brown discharge. For most women, lochia stops completely by 6-8 weeks after delivery. Vaginal bleeding can vary from woman to woman.  Within the first few days after delivery, you may have breast engorgement. This is when your breasts feel heavy, full, and uncomfortable. Your breasts may also throb and feel hard, tightly stretched, warm, and tender. After this occurs, you may have milk leaking from your breasts.Your health care provider can help you relieve discomfort due to breast engorgement. Breast  engorgement should go away within a few days.  You may feel more sad or worried than normal due to hormonal changes after delivery. These feelings should not last more than a few days. If these feelings do not go away after several days, speak with your health care provider. How should I care for myself?  Tell your health care provider if you have pain or discomfort.  Drink enough water to keep your urine clear or pale yellow.  Wash your hands thoroughly with soap and water for at least 20 seconds after changing your sanitary pads, after using the toilet, and before holding or feeding your baby.  If you are not breastfeeding, avoid touching your breasts a lot. Doing this can make your breasts produce more milk.  If you become weak or lightheaded, or you feel like you might faint, ask for help before: ? Getting out of bed. ? Showering.  Change your sanitary pads frequently. Watch for any changes in your flow, such as a sudden increase in volume, a change in color, the passing of large blood clots. If you pass a blood clot from your vagina, save it to show to your health care provider. Do not flush blood clots down the toilet without having your health care provider look at them.  Make sure that all your vaccinations are up to date. This can help protect you and your baby from getting certain diseases. You may need to have immunizations done before you leave the hospital.  If desired, talk with your health care provider about methods of family planning or birth control (contraception). How can I start bonding with my baby? Spending as much time as possible with your baby is very important. During this time, you and your baby can get to know each other and develop a bond. Having your baby stay with you in your room (rooming in) can give you time to get to know your baby. Rooming in can also help you become comfortable caring for your baby. Breastfeeding can also help you bond with your baby. How  can I plan for returning home with my baby?  Make sure that you have a car seat installed in your vehicle. ? Your car seat should be checked by a certified car seat installer to make sure that it is installed safely. ? Make sure that your baby fits into the car seat safely.  Ask your health care provider any questions you have about caring for yourself or your baby. Make sure that you  are able to contact your health care provider with any questions after leaving the hospital. This information is not intended to replace advice given to you by your health care provider. Make sure you discuss any questions you have with your health care provider. Document Released: 12/05/2006 Document Revised: 07/13/2015 Document Reviewed: 01/12/2015 Elsevier Interactive Patient Education  2018 [employer]. Postpartum Depression and Baby Blues The postpartum period begins right after the birth of a baby. During this time, there is often a great amount of joy and excitement. It is also a time of many changes in the life of the parents. Regardless of how many times a mother gives birth, each child brings new challenges and dynamics to the family. It is not unusual to have feelings of excitement along with confusing shifts in moods, emotions, and thoughts. All mothers are at risk of developing postpartum depression or the "baby blues." These mood changes can occur right after giving birth, or they may occur many months after giving birth. The baby blues or postpartum depression can be mild or severe. Additionally, postpartum depression can go away rather quickly, or it can be a long-term condition. What are the causes? Raised hormone levels and the rapid drop in those levels are thought to be a main cause of postpartum depression and the baby blues. A number of hormones change during and after pregnancy. Estrogen and progesterone usually decrease right after the delivery of your baby. The levels of thyroid hormone and  various cortisol steroids also rapidly drop. Other factors that play a role in these mood changes include major life events and genetics. What increases the risk? If you have any of the following risks for the baby blues or postpartum depression, know what symptoms to watch out for during the postpartum period. Risk factors that may increase the likelihood of getting the baby blues or postpartum depression include:  Having a personal or family history of depression.  Having depression while being pregnant.  Having premenstrual mood issues or mood issues related to oral contraceptives.  Having a lot of life stress.  Having marital conflict.  Lacking a social support network.  Having a baby with special needs.  Having health problems, such as diabetes.  What are the signs or symptoms? Symptoms of baby blues include:  Brief changes in mood, such as going from extreme happiness to sadness.  Decreased concentration.  Difficulty sleeping.  Crying spells, tearfulness.  Irritability.  Anxiety.  Symptoms of postpartum depression typically begin within the first month after giving birth. These symptoms include:  Difficulty sleeping or excessive sleepiness.  Marked weight loss.  Agitation.  Feelings of worthlessness.  Lack of interest in activity or food.  Postpartum psychosis is a very serious condition and can be dangerous. Fortunately, it is rare. Displaying any of the following symptoms is cause for immediate medical attention. Symptoms of postpartum psychosis include:  Hallucinations and delusions.  Bizarre or disorganized behavior.  Confusion or disorientation.  How is this diagnosed? A diagnosis is made by an evaluation of your symptoms. There are no medical or lab tests that lead to a diagnosis, but there are various questionnaires that a health care provider may use to identify those with the baby blues, postpartum depression, or psychosis. Often, a screening  tool called the [place] Postnatal Depression Scale is used to diagnose depression in the postpartum period. How is this treated? The baby blues usually goes away on its own in 1-2 weeks. Social support is often all that is needed.  You will be encouraged to get adequate sleep and rest. Occasionally, you may be given medicines to help you sleep. Postpartum depression requires treatment because it can last several months or longer if it is not treated. Treatment may include individual or group therapy, medicine, or both to address any social, physiological, and psychological factors that may play a role in the depression. Regular exercise, a healthy diet, rest, and social support may also be strongly recommended. Postpartum psychosis is more serious and needs treatment right away. Hospitalization is often needed. Follow these instructions at home:  Get as much rest as you can. Nap when the baby sleeps.  Exercise regularly. Some women find yoga and walking to be beneficial.  Eat a balanced and nourishing diet.  Do little things that you enjoy. Have a cup of tea, take a bubble bath, read your favorite magazine, or listen to your favorite music.  Avoid alcohol.  Ask for help with household chores, cooking, grocery shopping, or running errands as needed. Do not try to do everything.  Talk to people close to you about how you are feeling. Get support from your partner, family members, friends, or other new moms.  Try to stay positive in how you think. Think about the things you are grateful for.  Do not spend a lot of time alone.  Only take over-the-counter or prescription medicine as directed by your health care provider.  Keep all your postpartum appointments.  Let your health care provider know if you have any concerns. Contact a health care provider if: You are having a reaction to or problems with your medicine. Get help right away if:  You have suicidal feelings.  You think you  may harm the baby or someone else. This information is not intended to replace advice given to you by your health care provider. Make sure you discuss any questions you have with your health care provider. Document Released: 11/12/2003 Document Revised: 07/16/2015 Document Reviewed: 11/19/2012 Elsevier Interactive Patient Education  2017 [employer].

## 2017-09-06 NOTE — Discharge Summary (Addendum)
Obstetric Discharge Summary  Patient ID: Sarah Haney MRN: [id number] DOB/AGE: 13-May-1995 22 y.o.   Date of Admission: 09/03/2017  Date of Discharge:  09/06/17  Admitting Diagnosis: Onset of Labor at [redacted]w[redacted]d  Secondary Diagnosis: RH negative status  Mode of Delivery: Normal spontaneous vaginal delivery     Discharge Diagnosis: No other diagnosis   Intrapartum Procedures: Atificial rupture of membranes, epidural and pitocin augmentation   Post partum procedures: None  Complications: Right labial laceration, repaired   Brief Hospital Course   Sarah Haney is a [id number] who had a SVD on 09/04/2017;  for further details of this birth, please refer to the delivey note.  Patient had an uncomplicated postpartum course.  By time of discharge on PPD#2, her pain was controlled on oral pain medications; she had appropriate lochia and was ambulating, voiding without difficulty and tolerating regular diet.  She was deemed stable for discharge to home.    Labs: CBC Latest Ref Rng & Units 09/05/2017 09/03/2017 06/14/2017  WBC 3.6 - 11.0 K/uL 11.6(H) 12.1(H) 8.9  Hemoglobin 12.0 - 16.0 g/dL 11.6(L) 12.2 10.8(L)  Hematocrit 35.0 - 47.0 % 34.5(L) 36.8 32.5(L)  Platelets 150 - 440 K/uL 204 208 253   O NEG  Physical exam:   Temp:  [97.7 F (36.5 C)-98.2 F (36.8 C)] 97.7 F (36.5 C) (07/17 0739) Pulse Rate:  [62-90] 80 (07/17 0739) Resp:  [18-20] 18 (07/17 0739) BP: (105-136)/(60-83) 136/83 (07/17 0739) SpO2:  [98 %-100 %] 98 % (07/17 0739)  General: alert and no distress  Lochia: appropriate  Abdomen: soft, NT  Uterine Fundus: firm  Perineum: healing well, no significant drainage, no dehiscence, no significant erythema  Extremities: No evidence of DVT seen on physical exam. No lower extremity edema.  Discharge Instructions: Per After Visit Summary.  Activity: Advance as tolerated. Pelvic rest for 6 weeks.  Also refer to After Visit Summary  Diet:  Regular  Medications:  Allergies as of 09/06/2017   No Known Allergies     Medication List    STOP taking these medications   Evening Primrose Oil 500 MG Caps   pantoprazole 40 MG tablet Commonly known as:  PROTONIX     TAKE these medications   acetaminophen 500 MG tablet Commonly known as:  TYLENOL Take 500 mg by mouth every 6 (six) hours as needed for mild pain. Reported on 06/02/2015   CITRANATAL BLOOM 90-1 MG Tabs Take 1 tablet by mouth daily.   cyclobenzaprine 5 MG tablet Commonly known as:  FLEXERIL Take 1 tablet (5 mg total) by mouth 3 (three) times daily as needed for muscle spasms.   ibuprofen 600 MG tablet Commonly known as:  ADVIL,MOTRIN Take 1 tablet (600 mg total) by mouth every 6 (six) hours.   ranitidine 150 MG tablet Commonly known as:  ZANTAC Take 150 mg by mouth 2 (two) times daily.      Outpatient follow up:  Follow-up Information    Diona Fanti, CNM. Schedule an appointment as soon as possible for a visit.   Specialties:  Certified Nurse Midwife, Obstetrics and Gynecology, Radiology Why:  Please schedule six (6) week PPV with [facility] Contact information: [address] [place] [address] [phone]          Postpartum contraception: condoms  Discharged Condition: stable  Discharged to: home   Newborn Data:  Disposition:home with mother  Apgars: APGAR (1 MIN): 8   APGAR (5 MINS): 9    Baby Feeding:  Bottle and Breast   Diona Fanti, CNM [facility], [facility] 09/06/17 7:54 AM

## 2017-10-19 ENCOUNTER — Encounter: Payer: 59 | Admitting: Certified Nurse Midwife

## 2017-10-30 ENCOUNTER — Ambulatory Visit (INDEPENDENT_AMBULATORY_CARE_PROVIDER_SITE_OTHER): Payer: 59 | Admitting: Certified Nurse Midwife

## 2017-10-30 MED ORDER — NORETHINDRONE 0.35 MG PO TABS: 1.0000 | ORAL_TABLET | Freq: Every day | ORAL | 11 refills | Status: DC

## 2017-10-30 MED ORDER — LIDOCAINE 5 % EX OINT: 1.0000 "application " | TOPICAL_OINTMENT | CUTANEOUS | 0 refills | Status: DC | PRN

## 2017-10-30 NOTE — Progress Notes (Signed)
Pt presents today for 6 week PPV. Pt prefers BCP'S for contraceptive. Pt does not recall last pap and the is no record of one in her chart.

## 2017-10-30 NOTE — Progress Notes (Signed)
Subjective:    Sarah Haney is a 22 y.o. [id number] Caucasian female who presents for a postpartum visit. She is 6 weeks postpartum following a spontaneous vaginal delivery at 40+2 gestational weeks. Anesthesia: epidural. I have fully reviewed the prenatal and intrapartum course.   Postpartum course has been uncomplicated. Baby's course has been uncomplicated. Baby is feeding by breastfeeding with formula supplementation. Bleeding staining only. Bowel function is normal. Bladder function is normal.  Patient is not sexually active. Last sexual activity: during pregnancy. Contraception method is abstinence. Postpartum depression screening: negative. Score 0.  Last pap due.  Denies difficulty breathing or respiratory distress, chest pain, abdominal pain, excessive vaginal bleeding, dysuria, and leg pain or swelling.   The following portions of the patient's history were reviewed and updated as appropriate: allergies, current medications, past medical history, past surgical history and problem list.  Review of Systems  Pertinent items are noted in HPI.   Objective:   BP 102/76   Pulse 72   Ht 5\' 5"  (1.651 m)   Wt 188 lb (85.3 kg)   Breastfeeding? Yes   BMI 31.28 kg/m   General:  alert, cooperative and no distress   Breasts:  deferred, no complaints  Lungs: clear to auscultation bilaterally  Heart:  regular rate and rhythm  Abdomen: soft, nontender   Vulva: normal  Vagina: normal vagina  Cervix:  closed  Corpus: Well-involuted  Adnexa:  Non-palpable     Office Visit from 10/30/2017 in [facility]  PHQ-9 Total Score  0           Assessment:   Postpartum exam Six (6) wks s/p spontaneous vaginal birth Breastfeeding with formula supplementation Depression screening Contraception counseling   Plan:   Rx: Camila and Lidocaine; see orders.   Encouraged routine care and health maintenance.   Reviewed red flag symptoms and when to call.   Follow up in: 5 months  for ANNUAL EXAM or earlier if needed.   Diona Fanti, CNM [facility], [facility]

## 2017-10-30 NOTE — Patient Instructions (Addendum)
Preventive Care 18-39 Years, Female Preventive care refers to lifestyle choices and visits with your health care provider that can promote health and wellness. What does preventive care include?  A yearly physical exam. This is also called an annual well check.  Dental exams once or twice a year.  Routine eye exams. Ask your health care provider how often you should have your eyes checked.  Personal lifestyle choices, including: ? Daily care of your teeth and gums. ? Regular physical activity. ? Eating a healthy diet. ? Avoiding tobacco and drug use. ? Limiting alcohol use. ? Practicing safe sex. ? Taking vitamin and mineral supplements as recommended by your health care provider. What happens during an annual well check? The services and screenings done by your health care provider during your annual well check will depend on your age, overall health, lifestyle risk factors, and family history of disease. Counseling Your health care provider may ask you questions about your:  Alcohol use.  Tobacco use.  Drug use.  Emotional well-being.  Home and relationship well-being.  Sexual activity.  Eating habits.  Work and work Statistician.  Method of birth control.  Menstrual cycle.  Pregnancy history.  Screening You may have the following tests or measurements:  Height, weight, and BMI.  Diabetes screening. This is done by checking your blood sugar (glucose) after you have not eaten for a while (fasting).  Blood pressure.  Lipid and cholesterol levels. These may be checked every 5 years starting at age 66.  Skin check.  Hepatitis C blood test.  Hepatitis B blood test.  Sexually transmitted disease (STD) testing.  BRCA-related cancer screening. This may be done if you have a family history of breast, ovarian, tubal, or peritoneal cancers.  Pelvic exam and Pap test. This may be done every 3 years starting at age 40. Starting at age 59, this may be done every 5  years if you have a Pap test in combination with an HPV test.  Discuss your test results, treatment options, and if necessary, the need for more tests with your health care provider. Vaccines Your health care provider may recommend certain vaccines, such as:  Influenza vaccine. This is recommended every year.  Tetanus, diphtheria, and acellular pertussis (Tdap, Td) vaccine. You may need a Td booster every 10 years.  Varicella vaccine. You may need this if you have not been vaccinated.  HPV vaccine. If you are 69 or younger, you may need three doses over 6 months.  Measles, mumps, and rubella (MMR) vaccine. You may need at least one dose of MMR. You may also need a second dose.  Pneumococcal 13-valent conjugate (PCV13) vaccine. You may need this if you have certain conditions and were not previously vaccinated.  Pneumococcal polysaccharide (PPSV23) vaccine. You may need one or two doses if you smoke cigarettes or if you have certain conditions.  Meningococcal vaccine. One dose is recommended if you are age 27-21 years and a first-year college student living in a residence hall, or if you have one of several medical conditions. You may also need additional booster doses.  Hepatitis A vaccine. You may need this if you have certain conditions or if you travel or work in places where you may be exposed to hepatitis A.  Hepatitis B vaccine. You may need this if you have certain conditions or if you travel or work in places where you may be exposed to hepatitis B.  Haemophilus influenzae type b (Hib) vaccine. You may need this if  you have certain risk factors.  Talk to your health care provider about which screenings and vaccines you need and how often you need them. This information is not intended to replace advice given to you by your health care provider. Make sure you discuss any questions you have with your health care provider. Document Released: 04/05/2001 Document Revised: 10/28/2015  Document Reviewed: 12/09/2014 Elsevier Interactive Patient Education  2018 [employer]. Norethindrone tablets (contraception) What is this medicine? NORETHINDRONE (nor eth IN drone) is an oral contraceptive. The product contains a female hormone known as a progestin. It is used to prevent pregnancy. This medicine may be used for other purposes; ask your health care provider or pharmacist if you have questions. COMMON BRAND NAME(S): Camila, Deblitane 28-Day, [place], Heather, [place], [place], [place], Nor-QD, Nora-BE, Norlyroc, Ortho Micronor, [employer] 28-Day What should I tell my health care provider before I take this medicine? They need to know if you have any of these conditions: -blood vessel disease or blood clots -breast, cervical, or vaginal cancer -diabetes -heart disease -kidney disease -liver disease -mental depression -migraine -seizures -stroke -vaginal bleeding -an unusual or allergic reaction to norethindrone, other medicines, foods, dyes, or preservatives -pregnant or trying to get pregnant -breast-feeding How should I use this medicine? Take this medicine by mouth with a glass of water. You may take it with or without food. Follow the directions on the prescription label. Take this medicine at the same time each day and in the order directed on the package. Do not take your medicine more often than directed. Contact your pediatrician regarding the use of this medicine in children. Special care may be needed. This medicine has been used in female children who have started having menstrual periods. A patient package insert for the product will be given with each prescription and refill. Read this sheet carefully each time. The sheet may change frequently. Overdosage: If you think you have taken too much of this medicine contact a poison control center or emergency room at once. NOTE: This medicine is only for you. Do not share this medicine with others. What if I miss a  dose? Try not to miss a dose. Every time you miss a dose or take a dose late your chance of pregnancy increases. When 1 pill is missed (even if only 3 hours late), take the missed pill as soon as possible and continue taking a pill each day at the regular time (use a back up method of birth control for the next 48 hours). If more than 1 dose is missed, use an additional birth control method for the rest of your pill pack until menses occurs. Contact your health care professional if more than 1 dose has been missed. What may interact with this medicine? Do not take this medicine with any of the following medications: -amprenavir or fosamprenavir -bosentan This medicine may also interact with the following medications: -antibiotics or medicines for infections, especially rifampin, rifabutin, rifapentine, and griseofulvin, and possibly penicillins or tetracyclines -aprepitant -barbiturate medicines, such as phenobarbital -carbamazepine -felbamate -modafinil -oxcarbazepine -phenytoin -ritonavir or other medicines for HIV infection or AIDS -St. John's wort -topiramate This list may not describe all possible interactions. Give your health care provider a list of all the medicines, herbs, non-prescription drugs, or dietary supplements you use. Also tell them if you smoke, drink alcohol, or use illegal drugs. Some items may interact with your medicine. What should I watch for while using this medicine? Visit your doctor or health care professional for  regular checks on your progress. You will need a regular breast and pelvic exam and Pap smear while on this medicine. Use an additional method of birth control during the first cycle that you take these tablets. If you have any reason to think you are pregnant, stop taking this medicine right away and contact your doctor or health care professional. If you are taking this medicine for hormone related problems, it may take several cycles of use to see  improvement in your condition. This medicine does not protect you against HIV infection (AIDS) or any other sexually transmitted diseases. What side effects may I notice from receiving this medicine? Side effects that you should report to your doctor or health care professional as soon as possible: -breast tenderness or discharge -pain in the abdomen, chest, groin or leg -severe headache -skin rash, itching, or hives -sudden shortness of breath -unusually weak or tired -vision or speech problems -yellowing of skin or eyes Side effects that usually do not require medical attention (report to your doctor or health care professional if they continue or are bothersome): -changes in sexual desire -change in menstrual flow -facial hair growth -fluid retention and swelling -headache -irritability -nausea -weight gain or loss This list may not describe all possible side effects. Call your doctor for medical advice about side effects. You may report side effects to FDA at 1-800-FDA-1088. Where should I keep my medicine? Keep out of the reach of children. Store at room temperature between 15 and 30 degrees C (59 and 86 degrees F). Throw away any unused medicine after the expiration date. NOTE: This sheet is a summary. It may not cover all possible information. If you have questions about this medicine, talk to your doctor, pharmacist, or health care provider.  2018 Elsevier/Gold Standard (2011-10-28 16:41:35)

## 2018-01-08 ENCOUNTER — Encounter: Payer: Self-pay | Admitting: Certified Nurse Midwife

## 2018-01-09 ENCOUNTER — Other Ambulatory Visit: Payer: Self-pay

## 2018-01-09 MED ORDER — NORETHIN-ETH ESTRAD-FE BIPHAS 1 MG-10 MCG / 10 MCG PO TABS: 1.0000 | ORAL_TABLET | Freq: Every day | ORAL | 11 refills | Status: DC

## 2018-02-24 ENCOUNTER — Observation Stay
Admission: EM | Admit: 2018-02-24 | Discharge: 2018-02-27 | Disposition: A | Discharge status: Home / Self Care | Payer: 59 | Attending: Surgery | Admitting: Surgery

## 2018-02-24 ENCOUNTER — Other Ambulatory Visit: Payer: Self-pay

## 2018-02-24 ENCOUNTER — Emergency Department: Payer: 59

## 2018-02-24 DIAGNOSIS — M419 Scoliosis, unspecified: Secondary | ICD-10-CM | POA: Insufficient documentation

## 2018-02-24 DIAGNOSIS — R7401 Elevation of levels of liver transaminase levels: Secondary | ICD-10-CM | POA: Diagnosis present

## 2018-02-24 DIAGNOSIS — Z791 Long term (current) use of non-steroidal anti-inflammatories (NSAID): Secondary | ICD-10-CM | POA: Insufficient documentation

## 2018-02-24 DIAGNOSIS — I10 Essential (primary) hypertension: Secondary | ICD-10-CM | POA: Insufficient documentation

## 2018-02-24 DIAGNOSIS — K8010 Calculus of gallbladder with chronic cholecystitis without obstruction: Secondary | ICD-10-CM | POA: Diagnosis not present

## 2018-02-24 DIAGNOSIS — K8020 Calculus of gallbladder without cholecystitis without obstruction: Secondary | ICD-10-CM

## 2018-02-24 DIAGNOSIS — Z79899 Other long term (current) drug therapy: Secondary | ICD-10-CM | POA: Diagnosis not present

## 2018-02-24 DIAGNOSIS — R1013 Epigastric pain: Secondary | ICD-10-CM

## 2018-02-24 DIAGNOSIS — R945 Abnormal results of liver function studies: Secondary | ICD-10-CM | POA: Diagnosis present

## 2018-02-24 DIAGNOSIS — R740 Nonspecific elevation of levels of transaminase and lactic acid dehydrogenase [LDH]: Secondary | ICD-10-CM

## 2018-02-24 DIAGNOSIS — R7989 Other specified abnormal findings of blood chemistry: Secondary | ICD-10-CM

## 2018-02-24 LAB — COMPREHENSIVE METABOLIC PANEL
ALBUMIN: 4 g/dL (ref 3.5–5.0)
ALK PHOS: 192 U/L — AB (ref 38–126)
ALT: 458 U/L — ABNORMAL HIGH (ref 0–44)
AST: 430 U/L — AB (ref 15–41)
Anion gap: 7 (ref 5–15)
BILIRUBIN TOTAL: 2.4 mg/dL — AB (ref 0.3–1.2)
BUN: 12 mg/dL (ref 6–20)
CO2: 23 mmol/L (ref 22–32)
CREATININE: 0.74 mg/dL (ref 0.44–1.00)
Calcium: 9.1 mg/dL (ref 8.9–10.3)
Chloride: 108 mmol/L (ref 98–111)
Glucose, Bld: 98 mg/dL (ref 70–99)
POTASSIUM: 3.8 mmol/L (ref 3.5–5.1)
Sodium: 138 mmol/L (ref 135–145)
Total Protein: 7.6 g/dL (ref 6.5–8.1)

## 2018-02-24 LAB — URINALYSIS, COMPLETE (UACMP) WITH MICROSCOPIC
Glucose, UA: NEGATIVE mg/dL
Ketones, ur: 20 mg/dL — AB
Leukocytes, UA: NEGATIVE
NITRITE: NEGATIVE
PH: 5 (ref 5.0–8.0)
Protein, ur: 30 mg/dL — AB
SPECIFIC GRAVITY, URINE: 1.028 (ref 1.005–1.030)

## 2018-02-24 LAB — CBC
HEMATOCRIT: 46.3 % — AB (ref 36.0–46.0)
HEMOGLOBIN: 15.5 g/dL — AB (ref 12.0–15.0)
MCH: 30.4 pg (ref 26.0–34.0)
MCHC: 33.5 g/dL (ref 30.0–36.0)
MCV: 90.8 fL (ref 80.0–100.0)
NRBC: 0 % (ref 0.0–0.2)
PLATELETS: 299 10*3/uL (ref 150–400)
RBC: 5.1 MIL/uL (ref 3.87–5.11)
RDW: 11.9 % (ref 11.5–15.5)
WBC: 5 10*3/uL (ref 4.0–10.5)

## 2018-02-24 LAB — LIPASE, BLOOD: Lipase: 30 U/L (ref 11–51)

## 2018-02-24 MED ORDER — SODIUM CHLORIDE 0.9 % IV BOLUS
1000.0000 mL | Freq: Once | INTRAVENOUS | Status: AC
  Administered 2018-02-24: 1000 mL via INTRAVENOUS

## 2018-02-24 MED ORDER — FENTANYL CITRATE (PF) 100 MCG/2ML IJ SOLN
50.0000 ug | Freq: Once | INTRAMUSCULAR | Status: AC
  Administered 2018-02-24: 50 ug via INTRAVENOUS
  Filled 2018-02-24: qty 2

## 2018-02-24 NOTE — ED Triage Notes (Signed)
Pt states fever last night, nausea. States today urine is dark in color. States feels weak. Afebrile in triage. A&O, ambulatory. L flank pain.

## 2018-02-24 NOTE — ED Notes (Signed)
Pt asked to submit ua asap
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
## 2018-02-24 NOTE — ED Notes (Signed)
POC PREG NEG 

## 2018-02-25 ENCOUNTER — Other Ambulatory Visit: Payer: Self-pay

## 2018-02-25 ENCOUNTER — Observation Stay: Payer: 59

## 2018-02-25 DIAGNOSIS — R740 Nonspecific elevation of levels of transaminase and lactic acid dehydrogenase [LDH]: Secondary | ICD-10-CM

## 2018-02-25 DIAGNOSIS — K8010 Calculus of gallbladder with chronic cholecystitis without obstruction: Secondary | ICD-10-CM | POA: Diagnosis not present

## 2018-02-25 DIAGNOSIS — R7401 Elevation of levels of liver transaminase levels: Secondary | ICD-10-CM | POA: Diagnosis present

## 2018-02-25 LAB — HEPATIC FUNCTION PANEL
ALT: 308 U/L — ABNORMAL HIGH (ref 0–44)
AST: 198 U/L — ABNORMAL HIGH (ref 15–41)
Albumin: 3.4 g/dL — ABNORMAL LOW (ref 3.5–5.0)
Alkaline Phosphatase: 155 U/L — ABNORMAL HIGH (ref 38–126)
Bilirubin, Direct: 0.2 mg/dL (ref 0.0–0.2)
Indirect Bilirubin: 0.9 mg/dL (ref 0.3–0.9)
Total Bilirubin: 1.1 mg/dL (ref 0.3–1.2)
Total Protein: 6.6 g/dL (ref 6.5–8.1)

## 2018-02-25 MED ORDER — DOCUSATE SODIUM 100 MG PO CAPS: 100.0000 mg | ORAL_CAPSULE | Freq: Two times a day (BID) | ORAL | Status: DC | PRN

## 2018-02-25 MED ORDER — IBUPROFEN 400 MG PO TABS
600.0000 mg | ORAL_TABLET | Freq: Four times a day (QID) | ORAL | Status: DC
  Administered 2018-02-25 – 2018-02-27 (×9): 600 mg via ORAL
  Filled 2018-02-25 (×9): qty 2

## 2018-02-25 MED ORDER — SIMETHICONE 80 MG PO CHEW
80.0000 mg | CHEWABLE_TABLET | Freq: Four times a day (QID) | ORAL | Status: DC | PRN
  Administered 2018-02-25 – 2018-02-27 (×3): 80 mg via ORAL
  Filled 2018-02-25 (×4): qty 1

## 2018-02-25 MED ORDER — LACTATED RINGERS IV SOLN
INTRAVENOUS | Status: DC
  Administered 2018-02-25 – 2018-02-27 (×5): via INTRAVENOUS

## 2018-02-25 MED ORDER — ONDANSETRON HCL 4 MG/2ML IJ SOLN
4.0000 mg | Freq: Four times a day (QID) | INTRAMUSCULAR | Status: DC | PRN
  Administered 2018-02-25 (×2): 4 mg via INTRAVENOUS
  Filled 2018-02-25 (×2): qty 2

## 2018-02-25 MED ORDER — HYDROCODONE-ACETAMINOPHEN 5-325 MG PO TABS
1.0000 | ORAL_TABLET | ORAL | Status: DC | PRN
  Administered 2018-02-25 – 2018-02-27 (×9): 2 via ORAL
  Filled 2018-02-25 (×9): qty 2

## 2018-02-25 MED ORDER — NORETHIN-ETH ESTRAD-FE BIPHAS 1 MG-10 MCG / 10 MCG PO TABS: 1.0000 | ORAL_TABLET | Freq: Every day | ORAL | Status: DC

## 2018-02-25 MED ORDER — FAMOTIDINE 20 MG PO TABS
20.0000 mg | ORAL_TABLET | Freq: Two times a day (BID) | ORAL | Status: DC | PRN
  Administered 2018-02-26: 20 mg via ORAL
  Filled 2018-02-25: qty 1

## 2018-02-25 MED ORDER — ONDANSETRON 4 MG PO TBDP
4.0000 mg | ORAL_TABLET | Freq: Four times a day (QID) | ORAL | Status: DC | PRN
  Administered 2018-02-25 – 2018-02-26 (×2): 4 mg via ORAL
  Filled 2018-02-25 (×2): qty 1

## 2018-02-25 MED ORDER — TRAMADOL HCL 50 MG PO TABS
50.0000 mg | ORAL_TABLET | Freq: Four times a day (QID) | ORAL | Status: DC | PRN
  Administered 2018-02-26 – 2018-02-27 (×2): 50 mg via ORAL
  Filled 2018-02-25 (×2): qty 1

## 2018-02-25 MED ORDER — MORPHINE SULFATE (PF) 2 MG/ML IV SOLN
2.0000 mg | INTRAVENOUS | Status: DC | PRN
  Administered 2018-02-25 – 2018-02-27 (×5): 2 mg via INTRAVENOUS
  Filled 2018-02-25 (×5): qty 1

## 2018-02-25 MED ORDER — GADOBUTROL 1 MMOL/ML IV SOLN
8.0000 mL | Freq: Once | INTRAVENOUS | Status: AC | PRN
  Administered 2018-02-25: 8 mL via INTRAVENOUS
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
## 2018-02-25 NOTE — ED Notes (Signed)
PT TO MRI AT THIS TIME , PT TO GO FROM MRI TO FLOOR . ROOM 218

## 2018-02-26 ENCOUNTER — Encounter: Payer: Self-pay | Admitting: *Deleted

## 2018-02-26 ENCOUNTER — Observation Stay: Payer: 59 | Admitting: Anesthesiology

## 2018-02-26 ENCOUNTER — Encounter
Admission: EM | Disposition: A | Discharge status: Home / Self Care | Payer: Self-pay | Source: Home / Self Care | Attending: Emergency Medicine

## 2018-02-26 DIAGNOSIS — K8010 Calculus of gallbladder with chronic cholecystitis without obstruction: Secondary | ICD-10-CM | POA: Diagnosis not present

## 2018-02-26 HISTORY — PX: CHOLECYSTECTOMY: SHX55

## 2018-02-26 LAB — BASIC METABOLIC PANEL
Anion gap: 7 (ref 5–15)
BUN: 8 mg/dL (ref 6–20)
CO2: 24 mmol/L (ref 22–32)
Calcium: 8.7 mg/dL — ABNORMAL LOW (ref 8.9–10.3)
Chloride: 109 mmol/L (ref 98–111)
Creatinine, Ser: 0.69 mg/dL (ref 0.44–1.00)
GFR calc Af Amer: >60 mL/min (ref >60)
Glucose, Bld: 95 mg/dL (ref 70–99)
Potassium: 3.6 mmol/L (ref 3.5–5.1)
Sodium: 140 mmol/L (ref 135–145)

## 2018-02-26 LAB — HEPATIC FUNCTION PANEL
ALT: 196 U/L — ABNORMAL HIGH (ref 0–44)
AST: 65 U/L — ABNORMAL HIGH (ref 15–41)
Albumin: 3.4 g/dL — ABNORMAL LOW (ref 3.5–5.0)
Alkaline Phosphatase: 135 U/L — ABNORMAL HIGH (ref 38–126)
Total Bilirubin: 0.5 mg/dL (ref 0.3–1.2)
Total Protein: 6.6 g/dL (ref 6.5–8.1)

## 2018-02-26 LAB — CBC
HCT: 44.4 % (ref 36.0–46.0)
Hemoglobin: 14.7 g/dL (ref 12.0–15.0)
MCH: 30.4 pg (ref 26.0–34.0)
MCHC: 33.1 g/dL (ref 30.0–36.0)
MCV: 91.7 fL (ref 80.0–100.0)
Platelets: 238 10*3/uL (ref 150–400)
RBC: 4.84 MIL/uL (ref 3.87–5.11)
RDW: 11.9 % (ref 11.5–15.5)
WBC: 5.5 10*3/uL (ref 4.0–10.5)
nRBC: 0 % (ref 0.0–0.2)

## 2018-02-26 LAB — PHOSPHORUS: Phosphorus: 3.8 mg/dL (ref 2.5–4.6)

## 2018-02-26 LAB — MAGNESIUM: Magnesium: 2 mg/dL (ref 1.7–2.4)

## 2018-02-26 SURGERY — LAPAROSCOPIC CHOLECYSTECTOMY
Anesthesia: General

## 2018-02-26 MED ORDER — FENTANYL CITRATE (PF) 100 MCG/2ML IJ SOLN
INTRAMUSCULAR | Status: AC
  Filled 2018-02-26: qty 2

## 2018-02-26 MED ORDER — SUGAMMADEX SODIUM 200 MG/2ML IV SOLN
INTRAVENOUS | Status: DC | PRN
  Administered 2018-02-26: 170 mg via INTRAVENOUS

## 2018-02-26 MED ORDER — ACETAMINOPHEN 10 MG/ML IV SOLN
INTRAVENOUS | Status: AC
  Filled 2018-02-26: qty 100

## 2018-02-26 MED ORDER — LIDOCAINE-EPINEPHRINE (PF) 1 %-1:200000 IJ SOLN
INTRAMUSCULAR | Status: AC
  Filled 2018-02-26: qty 10

## 2018-02-26 MED ORDER — CEFAZOLIN SODIUM 1 G IJ SOLR
INTRAMUSCULAR | Status: AC
  Filled 2018-02-26: qty 20

## 2018-02-26 MED ORDER — OXYCODONE HCL 5 MG/5ML PO SOLN: 5.0000 mg | Freq: Once | ORAL | Status: DC | PRN

## 2018-02-26 MED ORDER — FENTANYL CITRATE (PF) 100 MCG/2ML IJ SOLN
INTRAMUSCULAR | Status: AC
  Administered 2018-02-26: 25 ug via INTRAVENOUS
  Filled 2018-02-26: qty 2

## 2018-02-26 MED ORDER — SODIUM CHLORIDE FLUSH 0.9 % IV SOLN
INTRAVENOUS | Status: AC
  Administered 2018-02-26: 17:00:00
  Filled 2018-02-26: qty 10

## 2018-02-26 MED ORDER — LIDOCAINE HCL (PF) 2 % IJ SOLN
INTRAMUSCULAR | Status: AC
  Filled 2018-02-26: qty 10

## 2018-02-26 MED ORDER — ROCURONIUM BROMIDE 100 MG/10ML IV SOLN
INTRAVENOUS | Status: DC | PRN
  Administered 2018-02-26: 30 mg via INTRAVENOUS
  Administered 2018-02-26: 20 mg via INTRAVENOUS

## 2018-02-26 MED ORDER — PROPOFOL 10 MG/ML IV BOLUS
INTRAVENOUS | Status: AC
  Filled 2018-02-26: qty 20

## 2018-02-26 MED ORDER — OXYCODONE HCL 5 MG PO TABS: 5.0000 mg | ORAL_TABLET | Freq: Once | ORAL | Status: DC | PRN

## 2018-02-26 MED ORDER — ACETAMINOPHEN 10 MG/ML IV SOLN
INTRAVENOUS | Status: DC | PRN
  Administered 2018-02-26: 1000 mg via INTRAVENOUS

## 2018-02-26 MED ORDER — DEXMEDETOMIDINE HCL 200 MCG/2ML IV SOLN
INTRAVENOUS | Status: DC | PRN
  Administered 2018-02-26: 8 ug via INTRAVENOUS

## 2018-02-26 MED ORDER — LIDOCAINE-EPINEPHRINE (PF) 1 %-1:200000 IJ SOLN
INTRAMUSCULAR | Status: DC | PRN
  Administered 2018-02-26: 15 mL via INTRAMUSCULAR

## 2018-02-26 MED ORDER — MIDAZOLAM HCL 2 MG/2ML IJ SOLN
INTRAMUSCULAR | Status: AC
  Filled 2018-02-26: qty 2

## 2018-02-26 MED ORDER — FENTANYL CITRATE (PF) 100 MCG/2ML IJ SOLN
25.0000 ug | INTRAMUSCULAR | Status: DC | PRN
  Administered 2018-02-26 (×4): 25 ug via INTRAVENOUS

## 2018-02-26 MED ORDER — ONDANSETRON HCL 4 MG/2ML IJ SOLN
INTRAMUSCULAR | Status: AC
  Filled 2018-02-26: qty 2

## 2018-02-26 MED ORDER — MEPERIDINE HCL 50 MG/ML IJ SOLN: 6.2500 mg | INTRAMUSCULAR | Status: DC | PRN

## 2018-02-26 MED ORDER — FENTANYL CITRATE (PF) 100 MCG/2ML IJ SOLN
INTRAMUSCULAR | Status: DC | PRN
  Administered 2018-02-26: 50 ug via INTRAVENOUS
  Administered 2018-02-26: 100 ug via INTRAVENOUS
  Administered 2018-02-26: 50 ug via INTRAVENOUS

## 2018-02-26 MED ORDER — BUPIVACAINE HCL (PF) 0.5 % IJ SOLN
INTRAMUSCULAR | Status: AC
  Filled 2018-02-26: qty 30

## 2018-02-26 MED ORDER — CEFAZOLIN SODIUM-DEXTROSE 2-3 GM-%(50ML) IV SOLR
INTRAVENOUS | Status: DC | PRN
  Administered 2018-02-26: 2 g via INTRAVENOUS

## 2018-02-26 MED ORDER — LIDOCAINE HCL (CARDIAC) PF 100 MG/5ML IV SOSY
PREFILLED_SYRINGE | INTRAVENOUS | Status: DC | PRN
  Administered 2018-02-26: 60 mg via INTRAVENOUS

## 2018-02-26 MED ORDER — SUCCINYLCHOLINE CHLORIDE 20 MG/ML IJ SOLN
INTRAMUSCULAR | Status: AC
  Filled 2018-02-26: qty 1

## 2018-02-26 MED ORDER — ROCURONIUM BROMIDE 50 MG/5ML IV SOLN
INTRAVENOUS | Status: AC
  Filled 2018-02-26: qty 1

## 2018-02-26 MED ORDER — PROMETHAZINE HCL 25 MG/ML IJ SOLN
INTRAMUSCULAR | Status: AC
  Administered 2018-02-26: 6.25 mg via INTRAVENOUS
  Filled 2018-02-26: qty 1

## 2018-02-26 MED ORDER — MIDAZOLAM HCL 2 MG/2ML IJ SOLN
INTRAMUSCULAR | Status: DC | PRN
  Administered 2018-02-26: 2 mg via INTRAVENOUS

## 2018-02-26 MED ORDER — PROPOFOL 10 MG/ML IV BOLUS
INTRAVENOUS | Status: DC | PRN
  Administered 2018-02-26: 170 mg via INTRAVENOUS

## 2018-02-26 MED ORDER — DEXAMETHASONE SODIUM PHOSPHATE 10 MG/ML IJ SOLN
INTRAMUSCULAR | Status: DC | PRN
  Administered 2018-02-26: 10 mg via INTRAVENOUS

## 2018-02-26 MED ORDER — PHENYLEPHRINE HCL 10 MG/ML IJ SOLN
INTRAMUSCULAR | Status: DC | PRN
  Administered 2018-02-26: 100 ug via INTRAVENOUS

## 2018-02-26 MED ORDER — ONDANSETRON HCL 4 MG/2ML IJ SOLN
INTRAMUSCULAR | Status: DC | PRN
  Administered 2018-02-26: 4 mg via INTRAVENOUS

## 2018-02-26 MED ORDER — SUGAMMADEX SODIUM 200 MG/2ML IV SOLN
INTRAVENOUS | Status: AC
  Filled 2018-02-26: qty 2

## 2018-02-26 MED ORDER — PROMETHAZINE HCL 25 MG/ML IJ SOLN
6.2500 mg | INTRAMUSCULAR | Status: DC | PRN
  Administered 2018-02-26: 6.25 mg via INTRAVENOUS

## 2018-02-26 MED ORDER — DEXAMETHASONE SODIUM PHOSPHATE 10 MG/ML IJ SOLN
INTRAMUSCULAR | Status: AC
  Filled 2018-02-26: qty 1

## 2018-02-26 SURGICAL SUPPLY — 59 items
APPLICATOR COTTON TIP 6 STRL (MISCELLANEOUS) IMPLANT
APPLICATOR COTTON TIP 6IN STRL (MISCELLANEOUS)
APPLIER CLIP 5 13 M/L LIGAMAX5 (MISCELLANEOUS) ×3
BLADE SURG SZ11 CARB STEEL (BLADE) ×3 IMPLANT
CANISTER SUCT 1200ML W/VALVE (MISCELLANEOUS) ×3 IMPLANT
CHLORAPREP W/TINT 26ML (MISCELLANEOUS) ×3 IMPLANT
CHOLANGIOGRAM CATH TAUT (CATHETERS) IMPLANT
CLIP APPLIE 5 13 M/L LIGAMAX5 (MISCELLANEOUS) ×1 IMPLANT
COVER WAND RF STERILE (DRAPES) ×3 IMPLANT
DECANTER SPIKE VIAL GLASS SM (MISCELLANEOUS) ×6 IMPLANT
DEFOGGER SCOPE WARMER CLEARIFY (MISCELLANEOUS) IMPLANT
DERMABOND ADVANCED (GAUZE/BANDAGES/DRESSINGS) ×2
DERMABOND ADVANCED .7 DNX12 (GAUZE/BANDAGES/DRESSINGS) ×1 IMPLANT
DISSECTOR BLUNT TIP ENDO 5MM (MISCELLANEOUS) IMPLANT
DISSECTOR KITTNER STICK (MISCELLANEOUS) IMPLANT
DISSECTORS/KITTNER STICK (MISCELLANEOUS)
DRAPE C-ARM XRAY 36X54 (DRAPES) IMPLANT
DRAPE SHEET LG 3/4 BI-LAMINATE (DRAPES) IMPLANT
ELECT CAUTERY BLADE 6.4 (BLADE) IMPLANT
ELECT REM PT RETURN 9FT ADLT (ELECTROSURGICAL) ×3
ELECTRODE REM PT RTRN 9FT ADLT (ELECTROSURGICAL) ×1 IMPLANT
ENDOLOOP SUT PDS II  0 18 (SUTURE) ×2
ENDOLOOP SUT PDS II 0 18 (SUTURE) IMPLANT
GLOVE BIOGEL PI IND STRL 7.0 (GLOVE) ×1 IMPLANT
GLOVE BIOGEL PI INDICATOR 7.0 (GLOVE) ×2
GLOVE SURG SYN 7.0 (GLOVE) ×3 IMPLANT
GLOVE SURG SYN 7.0 PF PI (GLOVE) ×1 IMPLANT
GOWN STRL REUS W/ TWL LRG LVL3 (GOWN DISPOSABLE) ×3 IMPLANT
GOWN STRL REUS W/TWL LRG LVL3 (GOWN DISPOSABLE) ×6
GRASPER SUT TROCAR 14GX15 (MISCELLANEOUS) ×3 IMPLANT
IRRIGATION STRYKERFLOW (MISCELLANEOUS) IMPLANT
IRRIGATOR STRYKERFLOW (MISCELLANEOUS)
IV CATH ANGIO 12GX3 LT BLUE (NEEDLE) IMPLANT
IV NS 1000ML (IV SOLUTION)
IV NS 1000ML BAXH (IV SOLUTION) IMPLANT
JACKSON PRATT 10 (INSTRUMENTS) IMPLANT
L-HOOK LAP DISP 36CM (ELECTROSURGICAL) ×3
LHOOK LAP DISP 36CM (ELECTROSURGICAL) ×1 IMPLANT
NEEDLE HYPO 22GX1.5 SAFETY (NEEDLE) ×3 IMPLANT
PACK LAP CHOLECYSTECTOMY (MISCELLANEOUS) ×3 IMPLANT
PENCIL ELECTRO HAND CTR (MISCELLANEOUS) ×3 IMPLANT
PORT ACCESS TROCAR AIRSEAL 5 (TROCAR) ×3 IMPLANT
POUCH SPECIMEN RETRIEVAL 10MM (ENDOMECHANICALS) ×3 IMPLANT
SCISSORS METZENBAUM CVD 33 (INSTRUMENTS) ×3 IMPLANT
SET TRI-LUMEN FLTR TB AIRSEAL (TUBING) ×3 IMPLANT
SLEEVE ENDOPATH XCEL 5M (ENDOMECHANICALS) ×3 IMPLANT
SPONGE LAP 18X18 RF (DISPOSABLE) IMPLANT
STOPCOCK 4 WAY LG BORE MALE ST (IV SETS) IMPLANT
SUT MNCRL 4-0 (SUTURE) ×2
SUT MNCRL 4-0 27XMFL (SUTURE) ×1
SUT VIC AB 3-0 SH 27 (SUTURE)
SUT VIC AB 3-0 SH 27X BRD (SUTURE) IMPLANT
SUT VICRYL 0 AB UR-6 (SUTURE) ×6 IMPLANT
SUTURE MNCRL 4-0 27XMF (SUTURE) ×1 IMPLANT
SYR 20CC LL (SYRINGE) ×3 IMPLANT
TOWEL OR 17X26 4PK STRL BLUE (TOWEL DISPOSABLE) ×3 IMPLANT
TROCAR XCEL BLUNT TIP 100MML (ENDOMECHANICALS) ×3 IMPLANT
TROCAR XCEL NON-BLD 5MMX100MML (ENDOMECHANICALS) ×3 IMPLANT
WATER STERILE IRR 1000ML POUR (IV SOLUTION) ×3 IMPLANT

## 2018-02-26 NOTE — Anesthesia Procedure Notes (Signed)
Procedure Name: Intubation Date/Time: 02/26/2018 12:13 PM Performed by: Jonna Clark, CRNA Pre-anesthesia Checklist: Patient identified, Patient being monitored, Timeout performed, Emergency Drugs available and Suction available Patient Re-evaluated:Patient Re-evaluated prior to induction Oxygen Delivery Method: Circle system utilized Preoxygenation: Pre-oxygenation with 100% oxygen Induction Type: IV induction Ventilation: Mask ventilation without difficulty Laryngoscope Size: Mac and 3 Grade View: Grade I Tube type: Oral Tube size: 7.0 mm Number of attempts: 1 Placement Confirmation: ETT inserted through vocal cords under direct vision,  positive ETCO2 and breath sounds checked- equal and bilateral Secured at: 22 cm Tube secured with: Tape Dental Injury: Teeth and Oropharynx as per pre-operative assessment
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
## 2018-02-26 NOTE — Anesthesia Postprocedure Evaluation (Signed)
Anesthesia Post Note  Patient: Sarah Haney  Procedure(s) Performed: LAPAROSCOPIC CHOLECYSTECTOMY (N/A )  Patient location during evaluation: PACU Anesthesia Type: General Level of consciousness: awake and alert and oriented Pain management: pain level controlled Vital Signs Assessment: post-procedure vital signs reviewed and stable Respiratory status: spontaneous breathing, nonlabored ventilation and respiratory function stable Cardiovascular status: blood pressure returned to baseline and stable Postop Assessment: no signs of nausea or vomiting Anesthetic complications: no     Last Vitals:  Vitals:   02/26/18 1511 02/26/18 1515  BP: 127/83   Pulse: 90 83  Resp: 12 15  Temp:    SpO2: 99% 96%    Last Pain:  Vitals:   02/26/18 1515  TempSrc:   PainSc: 7                  [name]

## 2018-02-26 NOTE — Progress Notes (Signed)
Spoke with OR for report. OR RN states pt to have lap choly today. Pt states she has not spoke with MD about surgery. Pt states she thought she was having an ERCP. OR provided report but educated that this RN would not have consent signed due to pt needing to speak with MD first. Pt provided with SCD's for OR and CHG bath as a precaution should she go to OR. Pt educated on current plan of care. Pt verbalizes understanding. Pt states she is going to call her mom to come to the hospital. Pt educated to call RN with any further questions. Pt verbalizes understanding.

## 2018-02-26 NOTE — Anesthesia Post-op Follow-up Note (Signed)
Anesthesia QCDR form completed.        

## 2018-02-26 NOTE — Transfer of Care (Signed)
Immediate Anesthesia Transfer of Care Note  Patient: Sarah Haney  Procedure(s) Performed: LAPAROSCOPIC CHOLECYSTECTOMY (N/A )  Patient Location: PACU  Anesthesia Type:General  Level of Consciousness: drowsy and patient cooperative  Airway & Oxygen Therapy: Patient Spontanous Breathing and Patient connected to face mask oxygen  Post-op Assessment: Report given to RN and Post -op Vital signs reviewed and stable  Post vital signs: Reviewed and stable  Last Vitals:  Vitals Value Taken Time  BP 122/80 02/26/2018  2:41 PM  Temp 36.2 C 02/26/2018  2:41 PM  Pulse 81 02/26/2018  2:43 PM  Resp 17 02/26/2018  2:43 PM  SpO2 100 % 02/26/2018  2:43 PM  Vitals shown include unvalidated device data.  Last Pain:  Vitals:   02/26/18 1017  TempSrc: Tympanic  PainSc: 0-No pain         Complications: No apparent anesthesia complications

## 2018-02-27 ENCOUNTER — Other Ambulatory Visit: Payer: Self-pay | Admitting: Surgery

## 2018-02-27 ENCOUNTER — Encounter: Payer: Self-pay | Admitting: Surgery

## 2018-02-27 DIAGNOSIS — K8010 Calculus of gallbladder with chronic cholecystitis without obstruction: Secondary | ICD-10-CM | POA: Diagnosis not present

## 2018-02-27 LAB — BASIC METABOLIC PANEL
Anion gap: 9 (ref 5–15)
BUN: 9 mg/dL (ref 6–20)
CO2: 24 mmol/L (ref 22–32)
Calcium: 9.2 mg/dL (ref 8.9–10.3)
Chloride: 107 mmol/L (ref 98–111)
Creatinine, Ser: 0.6 mg/dL (ref 0.44–1.00)
GFR calc Af Amer: >60 mL/min (ref >60)
GFR calc non Af Amer: >60 mL/min (ref >60)
Glucose, Bld: 95 mg/dL (ref 70–99)
Potassium: 3.8 mmol/L (ref 3.5–5.1)
Sodium: 140 mmol/L (ref 135–145)

## 2018-02-27 LAB — CBC
HCT: 43.2 % (ref 36.0–46.0)
HEMOGLOBIN: 14.6 g/dL (ref 12.0–15.0)
MCH: 30.7 pg (ref 26.0–34.0)
MCHC: 33.8 g/dL (ref 30.0–36.0)
MCV: 90.8 fL (ref 80.0–100.0)
Platelets: 279 10*3/uL (ref 150–400)
RBC: 4.76 MIL/uL (ref 3.87–5.11)
RDW: 11.6 % (ref 11.5–15.5)
WBC: 8.1 10*3/uL (ref 4.0–10.5)
nRBC: 0 % (ref 0.0–0.2)

## 2018-02-27 LAB — MAGNESIUM: Magnesium: 2 mg/dL (ref 1.7–2.4)

## 2018-02-27 LAB — HEPATIC FUNCTION PANEL
ALK PHOS: 133 U/L — AB (ref 38–126)
ALT: 146 U/L — ABNORMAL HIGH (ref 0–44)
AST: 40 U/L (ref 15–41)
Albumin: 3.9 g/dL (ref 3.5–5.0)
Bilirubin, Direct: 0.1 mg/dL (ref 0.0–0.2)
Indirect Bilirubin: 0.4 mg/dL (ref 0.3–0.9)
Total Bilirubin: 0.5 mg/dL (ref 0.3–1.2)
Total Protein: 7.4 g/dL (ref 6.5–8.1)

## 2018-02-27 LAB — PHOSPHORUS: PHOSPHORUS: 3.9 mg/dL (ref 2.5–4.6)

## 2018-02-27 MED ORDER — ACETAMINOPHEN 325 MG PO TABS: 650.0000 mg | ORAL_TABLET | Freq: Four times a day (QID) | ORAL | 0 refills | Status: AC | PRN

## 2018-02-27 MED ORDER — CELECOXIB 200 MG PO CAPS
200.0000 mg | ORAL_CAPSULE | Freq: Every day | ORAL | Status: DC
  Administered 2018-02-27: 200 mg via ORAL
  Filled 2018-02-27: qty 1

## 2018-02-27 MED ORDER — ALUM & MAG HYDROXIDE-SIMETH 200-200-20 MG/5ML PO SUSP
30.0000 mL | Freq: Four times a day (QID) | ORAL | Status: DC | PRN
  Administered 2018-02-27: 30 mL via ORAL
  Filled 2018-02-27: qty 30

## 2018-02-27 MED ORDER — GABAPENTIN 100 MG PO CAPS
200.0000 mg | ORAL_CAPSULE | Freq: Two times a day (BID) | ORAL | Status: DC
  Administered 2018-02-27: 200 mg via ORAL
  Filled 2018-02-27: qty 2

## 2018-02-27 MED ORDER — GABAPENTIN 100 MG PO CAPS: 200.0000 mg | ORAL_CAPSULE | Freq: Two times a day (BID) | ORAL | 0 refills | Status: DC | PRN

## 2018-02-27 MED ORDER — DOCUSATE SODIUM 100 MG PO CAPS: 100.0000 mg | ORAL_CAPSULE | Freq: Two times a day (BID) | ORAL | 0 refills | Status: AC | PRN

## 2018-02-27 MED ORDER — ACETAMINOPHEN 325 MG PO TABS
650.0000 mg | ORAL_TABLET | Freq: Four times a day (QID) | ORAL | Status: DC
  Administered 2018-02-27: 650 mg via ORAL
  Filled 2018-02-27: qty 2

## 2018-02-27 MED ORDER — CELECOXIB 200 MG PO CAPS: 200.0000 mg | ORAL_CAPSULE | Freq: Every day | ORAL | 0 refills | Status: AC | PRN

## 2018-02-27 MED ORDER — HYDROCODONE-ACETAMINOPHEN 5-325 MG PO TABS: 1.0000 | ORAL_TABLET | ORAL | 0 refills | Status: DC | PRN

## 2018-02-27 NOTE — Discharge Planning (Signed)
Patient IV removed.  RN assessment and VS revealed stability for DC to home.  Discharge papers given, explained and educated. Discussed selfcare and incision care at home.  Informed of suggested FU appt and appt made. Pain is under control and tolerating diet well. Scripts e-scribed to [facility] ([place]).  Once ready, will be wheeled to front and family transporting home via car. Waiting on family to arrive.

## 2018-02-27 NOTE — Discharge Summary (Signed)
Physician Discharge Summary  Patient ID: Sarah Haney MRN: [id number] DOB/AGE: September 19, 1995 23 y.o.  Admit date: 02/24/2018 Discharge date: 02/27/2018  Admission Diagnoses: elevated LFTs  Discharge Diagnoses:  Same as above with cholecystitis. Malrotation.  Discharged Condition: good  Hospital Course: Admitted to the emergency department for epigastric pain and nausea.  Work-up revealed elevated LFTs.  MRCP negative.  Sludge noted in gallbladder on the MRCP and incidental note of intestinal malrotation without any evidence of obstruction.  Patient proceeded with a laparoscopic cholecystectomy and recovered uneventfully.  At time of discharge patient's pain was controlled with oral meds tolerating a regular diet.  Consults: None  Discharge Exam: Blood pressure 133/86, pulse (!) 103, temperature 97.8 F (36.6 C), temperature source Oral, resp. rate 18, height 5\' 5"  (1.651 m), weight 86.6 kg, SpO2 98 %, currently breastfeeding. General appearance: alert, cooperative and no distress GI: soft, non-tender; bowel sounds normal; no masses,  no organomegaly and incisions c/d/i  Disposition:  Discharge disposition: 01-Home or Self Care       Discharge Instructions    Discharge patient   Complete by:  As directed    Discharge disposition:  01-Home or Self Care   Discharge patient date:  02/27/2018     Allergies as of 02/27/2018   No Known Allergies     Medication List    STOP taking these medications   CITRANATAL BLOOM 90-1 MG Tabs   cyclobenzaprine 5 MG tablet Commonly known as:  FLEXERIL   ibuprofen 600 MG tablet Commonly known as:  ADVIL,MOTRIN   lidocaine 5 % ointment Commonly known as:  XYLOCAINE   norethindrone 0.35 MG tablet Commonly known as:  MICRONOR,CAMILA,ERRIN     TAKE these medications   acetaminophen 325 MG tablet Commonly known as:  TYLENOL Take 2 tablets (650 mg total) by mouth every 6 (six) hours as needed for up to 10 days for mild pain. What  changed:    medication strength  how much to take  additional instructions   celecoxib 200 MG capsule Commonly known as:  CELEBREX Take 1 capsule (200 mg total) by mouth daily as needed for up to 10 days for moderate pain.   docusate sodium 100 MG capsule Commonly known as:  COLACE Take 1 capsule (100 mg total) by mouth 2 (two) times daily as needed for up to 10 days for mild constipation.   gabapentin 100 MG capsule Commonly known as:  NEURONTIN Take 2 capsules (200 mg total) by mouth 2 (two) times daily as needed for up to 10 days.   HYDROcodone-acetaminophen 5-325 MG tablet Commonly known as:  NORCO/VICODIN Take 1-2 tablets by mouth every 4 (four) hours as needed for moderate pain.   Norethindrone-Ethinyl Estradiol-Fe Biphas 1 MG-10 MCG / 10 MCG tablet Commonly known as:  LO LOESTRIN FE Take 1 tablet by mouth daily.   ranitidine 150 MG tablet Commonly known as:  ZANTAC Take 150 mg by mouth 2 (two) times daily.      Follow-up Information    Lysle Pearl, Jari Dipasquale, DO Follow up in 2 week(s).   Specialty:  Surgery Contact information: [place] [id number] [phone]            Total time spent arranging discharge was >32min. Signed: Benjamine Sprague 02/27/2018, 2:03 PM

## 2018-02-27 NOTE — Op Note (Addendum)
Preoperative diagnosis:  cholelithiasis  Postoperative diagnosis: same as above and acute cholecystitis  Procedure: Laparoscopic Cholecystectomy.   Anesthesia: GETA   Surgeon: Benjamine Sprague  Specimen: Gallbladder  Complications: None  EBL: 51mL  Wound Classification: Clean Contaminated  Indications: see HPI  Findings: Critical view of safety noted Cystic duct and artery identified, ligated and divided, clips remained intact at end of procedure Adequate hemostasis  Description of procedure: The patient was placed on the operating table in the supine position. SCDs placed, pre-op abx administered.  General anesthesia was induced and OG tube placed by anesthesia. A time-out was completed verifying correct patient, procedure, site, positioning, and implant(s) and/or special equipment prior to beginning this procedure. The abdomen was prepped and draped in the usual sterile fashion.  An incision was made in a natural skin line under the umbilicus.  Dissection carried down to fascia where two 0 vicryl sutures placed to use as anchor sutures for hasson port.  Incision made into fascia and blunt dissection used to enter peritoneum.  Hasson port placed and insufflation started up to 48mm Hg without any dramatic increase in pressure.    The laparoscope was inserted and the abdomen inspected. No injuries from initial trocar placement were noted. Additional trocars were then inserted under direct visualization in the following locations: a 5-mm trocar in the subxyphoid region and two 5-mm trocars along the right costal margin. The abdomen was inspected and no abnormalities or injuries were found. The table was placed in the reverse Trendelenburg position with the right side up.  Filmy adhesions between the gallbladder and omentum, duodenum and transverse colon were lysed sharply. The dome of the gallbladder was grasped with an atraumatic grasper passed through the lateral port and retracted over the  dome of the liver. The infundibulum was also grasped with an atraumatic grasper and retracted toward the right lower quadrant. This maneuver exposed Calot's triangle. The peritoneum overlying the gallbladder infundibulum was then dissected and the cystic duct and cystic artery identified.  Critical view of safety with the liver bed clearly visible behind the duct and artery with no additional structures noted.  Picture taken before the cystic duct and cystic artery clipped and divided close to the gallbladder.  The gallbladder was then dissected from its peritoneal and liver bed attachments by electrocautery. Hemostasis was checked and the gallbladder was removed using an endoscopic retrieval bag placed through the umbilical port. The gallbladder was passed off the table as a specimen. The gallbladder fossa was copiously irrigated with saline and any leaked bile was suctioned out, and hemostasis was obtained. There was no evidence of bleeding from the gallbladder fossa or cystic artery or leakage of the bile from the cystic duct stump, but the tip of clips noted to be flush with edge of cystic duct stump, so 0 PD endoloop used to further ligate the stump to prevent future leaks.  PMI used to close fascia and abdomen desufflated and secondary trocars were removed under direct vision. No bleeding was noted. 3-0 vicryl used to close deep dermal layer at umbilical site.  All skin incisions then closed with subcuticular sutures of 4-0 monocryl and dressed with topical skin adhesive. The orogastric tube was removed and patient extubated. The patient tolerated the procedure well and was taken to the postanesthesia care unit in stable condition.  All sponge and instrument count correct at end of procedure.

## 2018-02-28 LAB — SURGICAL PATHOLOGY

## 2018-03-14 DIAGNOSIS — Q433 Congenital malformations of intestinal fixation: Secondary | ICD-10-CM | POA: Insufficient documentation

## 2018-04-02 ENCOUNTER — Ambulatory Visit (INDEPENDENT_AMBULATORY_CARE_PROVIDER_SITE_OTHER): Payer: 59 | Admitting: Certified Nurse Midwife

## 2018-04-02 ENCOUNTER — Other Ambulatory Visit (HOSPITAL_COMMUNITY)
Admission: RE | Admit: 2018-04-02 | Discharge: 2018-04-02 | Disposition: A | Discharge status: Home / Self Care | Payer: 59 | Source: Ambulatory Visit | Attending: Certified Nurse Midwife | Admitting: Certified Nurse Midwife

## 2018-04-02 ENCOUNTER — Encounter: Payer: Self-pay | Admitting: Certified Nurse Midwife

## 2018-04-02 VITALS — BP 109/88 | HR 95 | Ht 65.0 in | Wt 195.2 lb

## 2018-04-02 DIAGNOSIS — N6011 Diffuse cystic mastopathy of right breast: Secondary | ICD-10-CM | POA: Diagnosis not present

## 2018-04-02 DIAGNOSIS — Z9049 Acquired absence of other specified parts of digestive tract: Secondary | ICD-10-CM | POA: Diagnosis not present

## 2018-04-02 DIAGNOSIS — Z01419 Encounter for gynecological examination (general) (routine) without abnormal findings: Secondary | ICD-10-CM

## 2018-04-02 DIAGNOSIS — Z124 Encounter for screening for malignant neoplasm of cervix: Secondary | ICD-10-CM | POA: Diagnosis not present

## 2018-04-02 HISTORY — DX: Diffuse cystic mastopathy of right breast: N60.11

## 2018-04-02 HISTORY — DX: Acquired absence of other specified parts of digestive tract: Z90.49

## 2018-04-02 MED ORDER — NORETHIN-ETH ESTRAD-FE BIPHAS 1 MG-10 MCG / 10 MCG PO TABS: 1.0000 | ORAL_TABLET | Freq: Every day | ORAL | 11 refills | Status: DC

## 2018-04-02 NOTE — Patient Instructions (Addendum)
WE WOULD LOVE TO HEAR FROM YOU!!!!   Thank you Sarah Haney for visiting [facility].  Providing our patients with the best experience possible is really important to [place], and we hope that you felt that on your recent visit. The most valuable feedback we get comes from [place]!!    If you receive a survey please take a couple of minutes to let us know how we did.Thank you for continuing to trust [place] with your care.   [facility]   Ethinyl Estradiol; Norethindrone Acetate; Ferrous fumarate tablets or capsules What is this medicine? ETHINYL ESTRADIOL; NORETHINDRONE ACETATE; FERROUS FUMARATE (ETH in il es tra DYE ole; nor eth IN drone AS e tate; FER [place] FUE ma rate) is an oral contraceptive. The products combine two types of female hormones, an estrogen and a progestin. They are used to prevent ovulation and pregnancy. Some products are also used to treat acne in females. This medicine may be used for other purposes; ask your health care provider or pharmacist if you have questions. COMMON BRAND NAME(S): Aurovela [address] 1/20, Aurovela Fe, [place] [address] Fe, Estrostep Fe, Gildess 24 Fe, Gildess Fe 1.5/30, Gildess Fe 1/20, Hailey 24 Fe, Junel Fe 1.5/30, Junel Fe 1/20, Junel Fe 24, Larin Fe, Lo Loestrin Fe, Loestrin 24 Fe, Loestrin FE 1.5/30, Loestrin FE 1/20, Lomedia 24 Fe, Microgestin 24 Fe, Microgestin Fe 1.5/30, Microgestin Fe 1/20, Tarina 24 Fe, Tarina Fe 1/20, Taytulla, Tilia Fe, Tri-Legest Fe What should I tell my health care provider before I take this medicine? They need to know if you have any of these conditions: -abnormal vaginal bleeding -blood vessel disease -breast, cervical, endometrial, ovarian, liver, or uterine cancer -diabetes -gallbladder disease -heart disease or recent heart attack -high blood pressure -high cholesterol -history of blood clots -kidney disease -liver disease -migraine headaches -smoke  tobacco -stroke -systemic lupus erythematosus (SLE) -an unusual or allergic reaction to estrogens, progestins, other medicines, foods, dyes, or preservatives -pregnant or trying to get pregnant -breast-feeding How should I use this medicine? Take this medicine by mouth. To reduce nausea, this medicine may be taken with food. Follow the directions on the prescription label. Take this medicine at the same time each day and in the order directed on the package. Do not take your medicine more often than directed. A patient package insert for the product will be given with each prescription and refill. Read this sheet carefully each time. The sheet may change frequently. Contact your pediatrician regarding the use of this medicine in children. Special care may be needed. This medicine has been used in female children who have started having menstrual periods. Overdosage: If you think you have taken too much of this medicine contact a poison control center or emergency room at once. NOTE: This medicine is only for you. Do not share this medicine with others. What if I miss a dose? If you miss a dose, refer to the patient information sheet you received with your medicine for direction. If you miss more than one pill, this medicine may not be as effective and you may need to use another form of birth control. What may interact with this medicine? Do not take this medicine with the following medication: -dasabuvir; ombitasvir; paritaprevir; ritonavir -ombitasvir; paritaprevir; ritonavir This medicine may also interact with the following medications: -acetaminophen -antibiotics or medicines for infections, especially rifampin, rifabutin, rifapentine, and griseofulvin, and possibly penicillins or tetracyclines -aprepitant -ascorbic acid (vitamin C) -atorvastatin -barbiturate medicines, such as  phenobarbital -bosentan -carbamazepine -caffeine -clofibrate -cyclosporine -dantrolene -doxercalciferol -felbamate -grapefruit juice -hydrocortisone -medicines for anxiety or sleeping problems, such as diazepam or temazepam -medicines for diabetes, including pioglitazone -mineral oil -modafinil -mycophenolate -nefazodone -oxcarbazepine -phenytoin -prednisolone -ritonavir or other medicines for HIV infection or AIDS -rosuvastatin -selegiline -soy isoflavones supplements -St. John's wort -tamoxifen or raloxifene -theophylline -thyroid hormones -topiramate -warfarin This list may not describe all possible interactions. Give your health care provider a list of all the medicines, herbs, non-prescription drugs, or dietary supplements you use. Also tell them if you smoke, drink alcohol, or use illegal drugs. Some items may interact with your medicine. What should I watch for while using this medicine? Visit your doctor or health care professional for regular checks on your progress. You will need a regular breast and pelvic exam and Pap smear while on this medicine. Use an additional method of contraception during the first cycle that you take these tablets. If you have any reason to think you are pregnant, stop taking this medicine right away and contact your doctor or health care professional. If you are taking this medicine for hormone related problems, it may take several cycles of use to see improvement in your condition. Smoking increases the risk of getting a blood clot or having a stroke while you are taking birth control pills, especially if you are more than 23 years old. You are strongly advised not to smoke. This medicine can make your body retain fluid, making your fingers, hands, or ankles swell. Your blood pressure can go up. Contact your doctor or health care professional if you feel you are retaining fluid. This medicine can make you more sensitive to the sun. Keep out  of the sun. If you cannot avoid being in the sun, wear protective clothing and use sunscreen. Do not use sun lamps or tanning beds/booths. If you wear contact lenses and notice visual changes, or if the lenses begin to feel uncomfortable, consult your eye care specialist. In some women, tenderness, swelling, or minor bleeding of the gums may occur. Notify your dentist if this happens. Brushing and flossing your teeth regularly may help limit this. See your dentist regularly and inform your dentist of the medicines you are taking. If you are going to have elective surgery, you may need to stop taking this medicine before the surgery. Consult your health care professional for advice. This medicine does not protect you against HIV infection (AIDS) or any other sexually transmitted diseases. What side effects may I notice from receiving this medicine? Side effects that you should report to your doctor or health care professional as soon as possible: -allergic reactions like skin rash, itching or hives, swelling of the face, lips, or tongue -breast tissue changes or discharge -changes in vaginal bleeding during your period or between your periods -changes in vision -chest pain -confusion -coughing up blood -dizziness -feeling faint or lightheaded -headaches or migraines -leg, arm or groin pain -loss of balance or coordination -severe or sudden headaches -stomach pain (severe) -sudden shortness of breath -sudden numbness or weakness of the face, arm or leg -symptoms of vaginal infection like itching, irritation or unusual discharge -tenderness in the upper abdomen -trouble speaking or understanding -vomiting -yellowing of the eyes or skin Side effects that usually do not require medical attention (report to your doctor or health care professional if they continue or are bothersome): -breakthrough bleeding and spotting that continues beyond the 3 initial cycles of pills -breast  tenderness -mood changes, anxiety, depression, frustration, anger,  or emotional outbursts -increased sensitivity to sun or ultraviolet light -nausea -skin rash, acne, or brown spots on the skin -weight gain (slight) This list may not describe all possible side effects. Call your doctor for medical advice about side effects. You may report side effects to FDA at 1-800-FDA-1088. Where should I keep my medicine? Keep out of the reach of children. Store at room temperature between 15 and 30 degrees C (59 and 86 degrees F). Throw away any unused medicine after the expiration date. NOTE: This sheet is a summary. It may not cover all possible information. If you have questions about this medicine, talk to your doctor, pharmacist, or health care provider.  2019 Elsevier/Gold Standard (2015-10-19 08:04:41) Preventive Care 18-39 Years, Female Preventive care refers to lifestyle choices and visits with your health care provider that can promote health and wellness. What does preventive care include?   A yearly physical exam. This is also called an annual well check.  Dental exams once or twice a year.  Routine eye exams. Ask your health care provider how often you should have your eyes checked.  Personal lifestyle choices, including: ? Daily care of your teeth and gums. ? Regular physical activity. ? Eating a healthy diet. ? Avoiding tobacco and drug use. ? Limiting alcohol use. ? Practicing safe sex. ? Taking vitamin and mineral supplements as recommended by your health care provider. What happens during an annual well check? The services and screenings done by your health care provider during your annual well check will depend on your age, overall health, lifestyle risk factors, and family history of disease. Counseling Your health care provider may ask you questions about your:  Alcohol use.  Tobacco use.  Drug use.  Emotional well-being.  Home and relationship  well-being.  Sexual activity.  Eating habits.  Work and work Statistician.  Method of birth control.  Menstrual cycle.  Pregnancy history. Screening You may have the following tests or measurements:  Height, weight, and BMI.  Diabetes screening. This is done by checking your blood sugar (glucose) after you have not eaten for a while (fasting).  Blood pressure.  Lipid and cholesterol levels. These may be checked every 5 years starting at age 36.  Skin check.  Hepatitis C blood test.  Hepatitis B blood test.  Sexually transmitted disease (STD) testing.  BRCA-related cancer screening. This may be done if you have a family history of breast, ovarian, tubal, or peritoneal cancers.  Pelvic exam and Pap test. This may be done every 3 years starting at age 42. Starting at age 76, this may be done every 5 years if you have a Pap test in combination with an HPV test. Discuss your test results, treatment options, and if necessary, the need for more tests with your health care provider. Vaccines Your health care provider may recommend certain vaccines, such as:  Influenza vaccine. This is recommended every year.  Tetanus, diphtheria, and acellular pertussis (Tdap, Td) vaccine. You may need a Td booster every 10 years.  Varicella vaccine. You may need this if you have not been vaccinated.  HPV vaccine. If you are 13 or younger, you may need three doses over 6 months.  Measles, mumps, and rubella (MMR) vaccine. You may need at least one dose of MMR. You may also need a second dose.  Pneumococcal 13-valent conjugate (PCV13) vaccine. You may need this if you have certain conditions and were not previously vaccinated.  Pneumococcal polysaccharide (PPSV23) vaccine. You may need one or  two doses if you smoke cigarettes or if you have certain conditions.  Meningococcal vaccine. One dose is recommended if you are age 58-21 years and a first-year college student living in a residence  hall, or if you have one of several medical conditions. You may also need additional booster doses.  Hepatitis A vaccine. You may need this if you have certain conditions or if you travel or work in places where you may be exposed to hepatitis A.  Hepatitis B vaccine. You may need this if you have certain conditions or if you travel or work in places where you may be exposed to hepatitis B.  Haemophilus influenzae type b (Hib) vaccine. You may need this if you have certain risk factors. Talk to your health care provider about which screenings and vaccines you need and how often you need them. This information is not intended to replace advice given to you by your health care provider. Make sure you discuss any questions you have with your health care provider. Document Released: 04/05/2001 Document Revised: 09/20/2016 Document Reviewed: 12/09/2014 Elsevier Interactive Patient Education  2019 [employer].
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
## 2018-04-02 NOTE — Progress Notes (Signed)
Patient here for annual exam, no complaints.  

## 2018-04-03 ENCOUNTER — Encounter: Payer: Self-pay | Admitting: Certified Nurse Midwife

## 2018-04-03 ENCOUNTER — Other Ambulatory Visit: Payer: Self-pay

## 2018-04-03 MED ORDER — PREDNISONE 10 MG (21) PO TBPK: ORAL_TABLET | ORAL | 0 refills | Status: DC

## 2018-04-04 LAB — CYTOLOGY - PAP
Adequacy: ABSENT
Diagnosis: NEGATIVE

## 2018-09-27 ENCOUNTER — Ambulatory Visit (INDEPENDENT_AMBULATORY_CARE_PROVIDER_SITE_OTHER): Payer: 59 | Admitting: Certified Nurse Midwife

## 2018-09-27 ENCOUNTER — Other Ambulatory Visit (HOSPITAL_COMMUNITY)
Admission: RE | Admit: 2018-09-27 | Discharge: 2018-09-27 | Disposition: A | Discharge status: Home / Self Care | Payer: 59 | Source: Ambulatory Visit | Attending: Certified Nurse Midwife | Admitting: Certified Nurse Midwife

## 2018-09-27 ENCOUNTER — Encounter: Payer: Self-pay | Admitting: Certified Nurse Midwife

## 2018-09-27 ENCOUNTER — Other Ambulatory Visit: Payer: Self-pay

## 2018-09-27 VITALS — BP 106/77 | HR 78 | Ht 65.0 in | Wt 204.6 lb

## 2018-09-27 DIAGNOSIS — R102 Pelvic and perineal pain: Secondary | ICD-10-CM

## 2018-09-27 DIAGNOSIS — Z8742 Personal history of other diseases of the female genital tract: Secondary | ICD-10-CM | POA: Diagnosis not present

## 2018-09-27 DIAGNOSIS — N898 Other specified noninflammatory disorders of vagina: Secondary | ICD-10-CM

## 2018-09-27 MED ORDER — TERCONAZOLE 0.4 % VA CREA: 1.0000 | TOPICAL_CREAM | Freq: Every day | VAGINAL | 0 refills | Status: DC

## 2018-09-27 NOTE — Patient Instructions (Addendum)
Ovarian Cyst An ovarian cyst is a fluid-filled sac on an ovary. The ovaries are organs that make eggs in women. Most ovarian cysts go away on their own and are not cancerous (are benign). Some cysts need treatment. Follow these instructions at home:  Take over-the-counter and prescription medicines only as told by your doctor.  Do not drive or use heavy machinery while taking prescription pain medicine.  Get pelvic exams and Pap tests as often as told by your doctor.  Return to your normal activities as told by your doctor. Ask your doctor what activities are safe for you.  Do not use any products that contain nicotine or tobacco, such as cigarettes and e-cigarettes. If you need help quitting, ask your doctor.  Keep all follow-up visits as told by your doctor. This is important. Contact a doctor if:  Your periods are: ? Late. ? Irregular. ? Painful.   Your periods stop.  You have pelvic pain that does not go away.  You have pressure on your bladder.  You have trouble making your bladder empty when you pee (urinate).  You have pain during sex.  You have any of the following in your belly (abdomen): ? A feeling of fullness. ? Pressure. ? Discomfort. ? Pain that does not go away. ? Swelling.  You feel sick most of the time.  You have trouble pooping (have constipation).  You are not as hungry as usual (you lose your appetite).  You get very bad acne.  You start to have more hair on your body and face.  You are gaining weight or losing weight without changing your exercise and eating habits.  You think you may be pregnant. Get help right away if:  You have belly pain that is very bad or gets worse.  You cannot eat or drink without throwing up (vomiting).  You suddenly get a fever.  Your period is a lot heavier than usual. This information is not intended to replace advice given to you by your health care provider. Make sure you discuss any questions you have  with your health care provider. Document Released: 07/27/2007 Document Revised: 01/20/2017 Document Reviewed: 07/12/2015 Elsevier Patient Education  2020 [employer]. Vaginitis  Vaginitis is irritation and swelling (inflammation) of the vagina. It happens when normal bacteria and yeast in the vagina grow too much. There are many types of this condition. Treatment will depend on the type you have. Follow these instructions at home: Lifestyle  Keep your vagina area clean and dry. ? Avoid using soap. ? Rinse the area with water.  Do not do the following until your doctor says it is okay: ? Wash and clean out the vagina (douche). ? Use tampons. ? Have sex.  Wipe from front to back after going to the bathroom.  Let air reach your vagina. ? Wear cotton underwear. ? Do not wear: ? Underwear while you sleep. ? Tight pants. ? Thong underwear. ? Underwear or nylons without a cotton panel. ? Take off any wet clothing, such as bathing suits, as soon as possible.  Use gentle, non-scented products. Do not use things that can irritate the vagina, such as fabric softeners. Avoid the following products if they are scented: ? Feminine sprays. ? Detergents. ? Tampons. ? Feminine hygiene products. ? Soaps or bubble baths.  Practice safe sex and use condoms. General instructions  Take over-the-counter and prescription medicines only as told by your doctor.  If you were prescribed an antibiotic medicine, take or use  it as told by your doctor. Do not stop taking or using the antibiotic even if you start to feel better.  Keep all follow-up visits as told by your doctor. This is important. Contact a doctor if:  You have pain in your belly.  You have a fever.  Your symptoms last for more than 2-3 days. Get help right away if:  You have a fever and your symptoms get worse all of a sudden. Summary  Vaginitis is irritation and swelling of the vagina. It can happen when the normal  bacteria and yeast in the vagina grow too much. There are many types.  Treatment will depend on the type you have.  Do not douche, use tampons , or have sex until your health care provider approves. When you can return to sex, practice safe sex and use condoms. This information is not intended to replace advice given to you by your health care provider. Make sure you discuss any questions you have with your health care provider. Document Released: 05/06/2008 Document Revised: 01/20/2017 Document Reviewed: 03/01/2016 Elsevier Patient Education  2020 [place]. Pelvic Pain, Female Pelvic pain is pain in your lower belly (abdomen), below your belly button and between your hips. The pain may start suddenly (be acute), keep coming back (be recurring), or last a long time (become chronic). Pelvic pain that lasts longer than 6 months is called chronic pelvic pain. There are many causes of pelvic pain. Sometimes the cause of pelvic pain is not known. Follow these instructions at home:   Take over-the-counter and prescription medicines only as told by your doctor.  Rest as told by your doctor.  Do not have sex if it hurts.  Keep a journal of your pelvic pain. Write down: ? When the pain started. ? Where the pain is located. ? What seems to make the pain better or worse, such as food or your period (menstrual cycle). ? Any symptoms you have along with the pain.  Keep all follow-up visits as told by your doctor. This is important. Contact a doctor if:  Medicine does not help your pain.  Your pain comes back.  You have new symptoms.  You have unusual discharge or bleeding from your vagina.  You have a fever or chills.  You are having trouble pooping (constipation).  You have blood in your pee (urine) or poop (stool).  Your pee smells bad.  You feel weak or light-headed. Get help right away if:  You have sudden pain that is very bad.  Your pain keeps getting worse.  You have  very bad pain and also have any of these symptoms: ? A fever. ? Feeling sick to your stomach (nausea). ? Throwing up (vomiting). ? Being very sweaty.  You pass out (lose consciousness). Summary  Pelvic pain is pain in your lower belly (abdomen), below your belly button and between your hips.  There are many possible causes of pelvic pain.  Keep a journal of your pelvic pain. This information is not intended to replace advice given to you by your health care provider. Make sure you discuss any questions you have with your health care provider. Document Released: 07/27/2007 Document Revised: 07/26/2017 Document Reviewed: 07/26/2017 Elsevier Patient Education  2020 [employer].

## 2018-09-27 NOTE — Progress Notes (Signed)
Patient c/o vaginal itching x5 days and intermittent pelvic pain x2 months.
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
## 2018-10-04 LAB — CERVICOVAGINAL ANCILLARY ONLY
Candida vaginitis: NEGATIVE
Chlamydia: NEGATIVE
Neisseria Gonorrhea: NEGATIVE
Trichomonas: NEGATIVE

## 2018-10-05 ENCOUNTER — Encounter: Payer: Self-pay | Admitting: Certified Nurse Midwife

## 2018-10-05 ENCOUNTER — Other Ambulatory Visit: Payer: Self-pay

## 2018-10-05 MED ORDER — METRONIDAZOLE 500 MG PO TABS: 500.0000 mg | ORAL_TABLET | Freq: Two times a day (BID) | ORAL | 0 refills | Status: DC

## 2018-10-09 ENCOUNTER — Ambulatory Visit (INDEPENDENT_AMBULATORY_CARE_PROVIDER_SITE_OTHER): Payer: 59

## 2018-10-09 ENCOUNTER — Encounter: Payer: Self-pay | Admitting: Certified Nurse Midwife

## 2018-10-09 ENCOUNTER — Other Ambulatory Visit: Payer: Self-pay

## 2018-10-09 ENCOUNTER — Ambulatory Visit (INDEPENDENT_AMBULATORY_CARE_PROVIDER_SITE_OTHER): Payer: 59 | Admitting: Certified Nurse Midwife

## 2018-10-09 VITALS — BP 107/82 | HR 83 | Ht 65.0 in | Wt 204.3 lb

## 2018-10-09 DIAGNOSIS — R102 Pelvic and perineal pain: Secondary | ICD-10-CM

## 2018-10-09 DIAGNOSIS — N83202 Unspecified ovarian cyst, left side: Secondary | ICD-10-CM | POA: Diagnosis not present

## 2018-10-09 DIAGNOSIS — Z8742 Personal history of other diseases of the female genital tract: Secondary | ICD-10-CM | POA: Diagnosis not present

## 2018-10-09 MED ORDER — TRAMADOL HCL 50 MG PO TABS: 50.0000 mg | ORAL_TABLET | Freq: Four times a day (QID) | ORAL | 0 refills | Status: DC | PRN

## 2018-10-09 NOTE — Progress Notes (Signed)
Patient here to discuss ultrasound report.
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
## 2018-10-09 NOTE — Patient Instructions (Addendum)
Tramadol tablets What is this medicine? TRAMADOL (TRA ma dole) is a pain reliever. It is used to treat moderate to severe pain in adults. This medicine may be used for other purposes; ask your health care provider or pharmacist if you have questions. COMMON BRAND NAME(S): Ultram What should I tell my health care provider before I take this medicine? They need to know if you have any of these conditions:  brain tumor  depression  drug abuse or addiction  head injury  if you frequently drink alcohol containing drinks  kidney disease or trouble passing urine  liver disease  lung disease, asthma, or breathing problems  seizures or epilepsy  suicidal thoughts, plans, or attempt; a previous suicide attempt by you or a family member  an unusual or allergic reaction to tramadol, codeine, other medicines, foods, dyes, or preservatives  pregnant or trying to get pregnant  breast-feeding How should I use this medicine? Take this medicine by mouth with a full glass of water. Follow the directions on the prescription label. You can take it with or without food. If it upsets your stomach, take it with food. Do not take your medicine more often than directed. A special MedGuide will be given to you by the pharmacist with each prescription and refill. Be sure to read this information carefully each time. Talk to your pediatrician regarding the use of this medicine in children. Special care may be needed. Overdosage: If you think you have taken too much of this medicine contact a poison control center or emergency room at once. NOTE: This medicine is only for you. Do not share this medicine with others. What if I miss a dose? If you miss a dose, take it as soon as you can. If it is almost time for your next dose, take only that dose. Do not take double or extra doses. What may interact with this medicine? Do not take this medication with any of the following medicines:  MAOIs like Carbex,  Eldepryl, Marplan, Nardil, and Parnate This medicine may also interact with the following medications:  alcohol  antihistamines for allergy, cough and cold  certain medicines for anxiety or sleep  certain medicines for depression like amitriptyline, fluoxetine, sertraline  certain medicines for migraine headache like almotriptan, eletriptan, frovatriptan, naratriptan, rizatriptan, sumatriptan, zolmitriptan  certain medicines for seizures like carbamazepine, oxcarbazepine, phenobarbital, primidone  certain medicines that treat or prevent blood clots like warfarin  digoxin  furazolidone  general anesthetics like halothane, isoflurane, methoxyflurane, propofol  linezolid  local anesthetics like lidocaine, pramoxine, tetracaine  medicines that relax muscles for surgery  other narcotic medicines for pain or cough  phenothiazines like chlorpromazine, mesoridazine, prochlorperazine, thioridazine  procarbazine This list may not describe all possible interactions. Give your health care provider a list of all the medicines, herbs, non-prescription drugs, or dietary supplements you use. Also tell them if you smoke, drink alcohol, or use illegal drugs. Some items may interact with your medicine. What should I watch for while using this medicine? Tell your doctor or health care provider if your pain does not go away, if it gets worse, or if you have new or a different type of pain. You may develop tolerance to the medicine. Tolerance means that you will need a higher dose of the medicine for pain relief. Tolerance is normal and is expected if you take this medicine for a long time. This medicine may cause serious skin reactions. They can happen weeks to months after starting the medicine. Contact  your health care provider right away if you notice fevers or flu-like symptoms with a rash. The rash may be red or purple and then turn into blisters or peeling of the skin. Or, you might notice a  red rash with swelling of the face, lips or lymph nodes in your neck or under your arms. Do not suddenly stop taking your medicine because you may develop a severe reaction. Your body becomes used to the medicine. This does NOT mean you are addicted. Addiction is a behavior related to getting and using a drug for a non-medical reason. If you have pain, you have a medical reason to take pain medicine. Your doctor will tell you how much medicine to take. If your doctor wants you to stop the medicine, the dose will be slowly lowered over time to avoid any side effects. There are different types of narcotic medicines (opiates). If you take more than one type at the same time or if you are taking another medicine that also causes drowsiness, you may have more side effects. Give your health care provider a list of all medicines you use. Your doctor will tell you how much medicine to take. Do not take more medicine than directed. Call emergency for help if you have problems breathing or unusual sleepiness. You may get drowsy or dizzy. Do not drive, use machinery, or do anything that needs mental alertness until you know how this medicine affects you. Do not stand or sit up quickly, especially if you are an older patient. This reduces the risk of dizzy or fainting spells. Alcohol can increase or decrease the effects of this medicine. Avoid alcoholic drinks. You may have constipation. Try to have a bowel movement at least every 2 to 3 days. If you do not have a bowel movement for 3 days, call your doctor or health care provider. Your mouth may get dry. Chewing sugarless gum or sucking hard candy, and drinking plenty of water may help. Contact your doctor if the problem does not go away or is severe. What side effects may I notice from receiving this medicine? Side effects that you should report to your doctor or health care professional as soon as possible:  allergic reactions like skin rash, itching or hives,  swelling of the face, lips, or tongue  breathing problems  confusion  redness, blistering, peeling or loosening of the skin, including inside the mouth  seizures  signs and symptoms of low blood pressure like dizziness; feeling faint or lightheaded, falls; unusually weak or tired  trouble passing urine or change in the amount of urine Side effects that usually do not require medical attention (report to your doctor or health care professional if they continue or are bothersome):  constipation  dry mouth  nausea, vomiting  tiredness This list may not describe all possible side effects. Call your doctor for medical advice about side effects. You may report side effects to FDA at 1-800-FDA-1088. Where should I keep my medicine? Keep out of the reach of children. This medicine may cause accidental overdose and death if it taken by other adults, children, or pets. Mix any unused medicine with a substance like cat litter or coffee grounds. Then throw the medicine away in a sealed container like a sealed bag or a coffee can with a lid. Do not use the medicine after the expiration date. Store at room temperature between 15 and 30 degrees C (59 and 86 degrees F). NOTE: This sheet is a summary. It may  not cover all possible information. If you have questions about this medicine, talk to your doctor, pharmacist, or health care provider.  2020 Elsevier/Gold Standard (2018-05-18 15:56:48) Ovarian Torsion  In women, the ovaries are small organs in the lower abdomen that produce eggs. Ovarian torsion happens when an ovary becomes twisted and cuts off its own blood supply. In many cases, both the ovary and the tube that connects the ovary to the uterus (fallopian tube) on the affected side become twisted (adnexal torsion). This causes the ovary to swell. If left untreated, the ovary may become infected. This condition can be very painful. Ovarian torsion can happen at any age. This condition is a  medical emergency that must be treated quickly with surgery. What are the causes? This condition may be caused by:  Having an ovary that is larger than normal (enlarged). This is a common cause.  Pregnancy. Ovarian torsion may also be a complication of pregnancy. In some cases, the cause of this condition is not known. What increases the risk? You are more likely to develop this condition if you:  Have ovaries that are enlarged because of ovarian cysts.  Take fertility medicine to become pregnant.  Are pregnant.  Have a history of ovarian torsion. What are the signs or symptoms? The main symptom of this condition is pain in the pelvic area, usually on one side of the body. The pain may be severe and may come and go suddenly. Other symptoms may include:  Pelvic pain that spreads to the sides, lower back, or thighs.  Nausea and vomiting.  Fever. How is this diagnosed? This condition is diagnosed based on your medical history and a physical exam.  Your health care provider may perform a pelvic exam and check your lower abdomen for an enlarged ovary. You may also have other tests, including:  A pregnancy test.  An ultrasound to look at your ovary and the blood flow to your ovaries.  CT scan.  MRI.  Diagnostic laparoscopy. This is a procedure in which a thin tube with a light and camera on the end (laparoscope) is inserted through a small incision in your abdomen to look at your ovary. How is this treated? This condition is treated with surgery to untwist the ovary (laparoscopic ovarian torsion surgery).  If treated early, ovarian function can usually be restored.  If the ovary cannot be untwisted, the ovary will have to be surgically removed (oophorectomy). The decision to remove your ovary will be based on your age, your desire to have children, and the condition of your ovary at the time of surgery. Follow these instructions at home:  Take over-the-counter and  prescription medicines only as told by your health care provider.  Do not drive or use heavy machinery while taking prescription pain medicine.  Get regular pelvic exams as often as told by your health care provider.  Return to your normal activities as told by your health care provider. Ask your health care provider what activities are safe for you.  Keep all follow-up visits as told by your health care provider. This is important. Contact a health care provider if you:  Have any symptoms of ovarian torsion.  Have pelvic pain that does not go away.  Are pregnant and have pelvic pain. Get help right away if you:  Have sudden, severe pelvic pain.  Have nausea and vomiting that does not go away.  Suddenly have a fever. These symptoms may represent a serious problem that is an emergency.  Do not wait to see if the symptoms will go away. Get medical help right away. Call your local emergency services (911 in the [place] Do not drive yourself to the hospital. Summary  Ovarian torsion is a serious condition that requires immediate medical treatment.  Talk with your health care provider about your risk for this condition.  If you are high risk for an ovarian torsion and experience symptoms, seek immediate medical care. This information is not intended to replace advice given to you by your health care provider. Make sure you discuss any questions you have with your health care provider. Document Released: 01/27/2011 Document Revised: 07/13/2017 Document Reviewed: 07/13/2017 Elsevier Patient Education  2020 [place]. Ovarian Cyst     An ovarian cyst is a fluid-filled sac that forms on an ovary. The ovaries are small organs that produce eggs in women. Various types of cysts can form on the ovaries. Some may cause symptoms and require treatment. Most ovarian cysts go away on their own, are not cancerous (are benign), and do not cause problems. Common types of ovarian cysts include:   Functional (follicle) cysts. ? Occur during the menstrual cycle, and usually go away with the next menstrual cycle if you do not get pregnant. ? Usually cause no symptoms.  Endometriomas. ? Are cysts that form from the tissue that lines the uterus (endometrium). ? Are sometimes called "chocolate cysts" because they become filled with blood that turns brown. ? Can cause pain in the lower abdomen during intercourse and during your period.  Cystadenoma cysts. ? Develop from cells on the outside surface of the ovary. ? Can get very large and cause lower abdomen pain and pain with intercourse. ? Can cause severe pain if they twist or break open (rupture).  Dermoid cysts. ? Are sometimes found in both ovaries. ? May contain different kinds of body tissue, such as skin, teeth, hair, or cartilage. ? Usually do not cause symptoms unless they get very big.  Theca lutein cysts. ? Occur when too much of a certain hormone (human chorionic gonadotropin) is produced and overstimulates the ovaries to produce an egg. ? Are most common after having procedures used to assist with the conception of a baby (in vitro fertilization). What are the causes? Ovarian cysts may be caused by:  Ovarian hyperstimulation syndrome. This is a condition that can develop from taking fertility medicines. It causes multiple large ovarian cysts to form.  Polycystic ovarian syndrome (PCOS). This is a common hormonal disorder that can cause ovarian cysts, as well as problems with your period or fertility. What increases the risk? The following factors may make you more likely to develop ovarian cysts:  Being overweight or obese.  Taking fertility medicines.  Taking certain forms of hormonal birth control.  Smoking. What are the signs or symptoms? Many ovarian cysts do not cause symptoms. If symptoms are present, they may include:  Pelvic pain or pressure.  Pain in the lower abdomen.  Pain during sex.  Abdominal  swelling.  Abnormal menstrual periods.  Increasing pain with menstrual periods. How is this diagnosed? These cysts are commonly found during a routine pelvic exam. You may have tests to find out more about the cyst, such as:  Ultrasound.  X-ray of the pelvis.  CT scan.  MRI.  Blood tests. How is this treated? Many ovarian cysts go away on their own without treatment. Your health care provider may want to check your cyst regularly for 2-3 months to see if it  changes. If you are in menopause, it is especially important to have your cyst monitored closely because menopausal women have a higher rate of ovarian cancer. When treatment is needed, it may include:  Medicines to help relieve pain.  A procedure to drain the cyst (aspiration).  Surgery to remove the whole cyst.  Hormone treatment or birth control pills. These methods are sometimes used to help dissolve a cyst. Follow these instructions at home:  Take over-the-counter and prescription medicines only as told by your health care provider.  Do not drive or use heavy machinery while taking prescription pain medicine.  Get regular pelvic exams and Pap tests as often as told by your health care provider.  Return to your normal activities as told by your health care provider. Ask your health care provider what activities are safe for you.  Do not use any products that contain nicotine or tobacco, such as cigarettes and e-cigarettes. If you need help quitting, ask your health care provider.  Keep all follow-up visits as told by your health care provider. This is important. Contact a health care provider if:  Your periods are late, irregular, or painful, or they stop.  You have pelvic pain that does not go away.  You have pressure on your bladder or trouble emptying your bladder completely.  You have pain during sex.  You have any of the following in your abdomen: ? A feeling of fullness. ? Pressure. ? Discomfort. ?  Pain that does not go away. ? Swelling.  You feel generally ill.  You become constipated.  You lose your appetite.  You develop severe acne.  You start to have more body hair and facial hair.  You are gaining weight or losing weight without changing your exercise and eating habits.  You think you may be pregnant. Get help right away if:  You have abdominal pain that is severe or gets worse.  You cannot eat or drink without vomiting.  You suddenly develop a fever.  Your menstrual period is much heavier than usual. This information is not intended to replace advice given to you by your health care provider. Make sure you discuss any questions you have with your health care provider. Document Released: 02/07/2005 Document Revised: 05/08/2017 Document Reviewed: 07/12/2015 Elsevier Patient Education  2020 [employer].

## 2018-10-22 ENCOUNTER — Encounter: Payer: Self-pay | Admitting: Certified Nurse Midwife

## 2018-10-22 ENCOUNTER — Other Ambulatory Visit: Payer: Self-pay | Admitting: Certified Nurse Midwife

## 2018-10-22 DIAGNOSIS — N83202 Unspecified ovarian cyst, left side: Secondary | ICD-10-CM

## 2018-10-22 MED ORDER — OXYCODONE HCL 5 MG PO TABS: 5.0000 mg | ORAL_TABLET | Freq: Four times a day (QID) | ORAL | 0 refills | Status: DC | PRN

## 2018-10-22 NOTE — Telephone Encounter (Signed)
Please contact patient, see if she desires sleeping pill or short course of percocet or Roxicodone. Thanks, JML

## 2018-10-22 NOTE — Telephone Encounter (Signed)
Please contact patient. Will send small amount of Roxi. May continue to take Tylenol, Motrin, and/or Ultram as needed. JML

## 2018-11-20 ENCOUNTER — Other Ambulatory Visit: Payer: Self-pay

## 2018-11-20 ENCOUNTER — Ambulatory Visit (INDEPENDENT_AMBULATORY_CARE_PROVIDER_SITE_OTHER): Payer: 59 | Admitting: Certified Nurse Midwife

## 2018-11-20 ENCOUNTER — Encounter: Payer: Self-pay | Admitting: Certified Nurse Midwife

## 2018-11-20 ENCOUNTER — Ambulatory Visit (INDEPENDENT_AMBULATORY_CARE_PROVIDER_SITE_OTHER): Payer: 59

## 2018-11-20 VITALS — BP 118/84 | HR 99 | Ht 65.0 in | Wt 204.9 lb

## 2018-11-20 DIAGNOSIS — N83202 Unspecified ovarian cyst, left side: Secondary | ICD-10-CM

## 2018-11-20 MED ORDER — OXYCODONE HCL 5 MG PO TABS: 5.0000 mg | ORAL_TABLET | Freq: Four times a day (QID) | ORAL | 0 refills | Status: DC | PRN

## 2018-11-20 NOTE — Patient Instructions (Addendum)
Ovarian Cyst An ovarian cyst is a fluid-filled sac on an ovary. The ovaries are organs that make eggs in women. Most ovarian cysts go away on their own and are not cancerous (are benign). Some cysts need treatment. Follow these instructions at home:  Take over-the-counter and prescription medicines only as told by your doctor.  Do not drive or use heavy machinery while taking prescription pain medicine.  Get pelvic exams and Pap tests as often as told by your doctor.  Return to your normal activities as told by your doctor. Ask your doctor what activities are safe for you.  Do not use any products that contain nicotine or tobacco, such as cigarettes and e-cigarettes. If you need help quitting, ask your doctor.  Keep all follow-up visits as told by your doctor. This is important. Contact a doctor if:  Your periods are: ? Late. ? Irregular. ? Painful.   Your periods stop.  You have pelvic pain that does not go away.  You have pressure on your bladder.  You have trouble making your bladder empty when you pee (urinate).  You have pain during sex.  You have any of the following in your belly (abdomen): ? A feeling of fullness. ? Pressure. ? Discomfort. ? Pain that does not go away. ? Swelling.  You feel sick most of the time.  You have trouble pooping (have constipation).  You are not as hungry as usual (you lose your appetite).  You get very bad acne.  You start to have more hair on your body and face.  You are gaining weight or losing weight without changing your exercise and eating habits.  You think you may be pregnant. Get help right away if:  You have belly pain that is very bad or gets worse.  You cannot eat or drink without throwing up (vomiting).  You suddenly get a fever.  Your period is a lot heavier than usual. This information is not intended to replace advice given to you by your health care provider. Make sure you discuss any questions you have  with your health care provider. Document Released: 07/27/2007 Document Revised: 01/20/2017 Document Reviewed: 07/12/2015 Elsevier Patient Education  2020 [place].   Ovarian Cystectomy  Ovarian cystectomy is a procedure that is done to remove a fluid-filled sac (cyst) on an ovary. The ovaries are small organs that produce eggs in women. Various types of cysts can form on the ovaries. Most are not cancerous. This procedure may be done for cysts that are large, cause symptoms, or do not go away on their own. It may also be done for a cyst that is cancerous or might be cancerous. This surgery can be done using a laparoscopic technique or an open abdominal technique. The laparoscopic technique involves smaller incisions and results in a faster recovery time. The technique used will depend on your age, the type of cyst that you have, and whether the cyst is cancerous. The laparoscopic technique is not used for a cancerous cyst. Tell a health care provider about:  Any allergies you have.  All medicines you are taking, including vitamins, herbs, eye drops, creams, and over-the-counter medicines.  Any problems you or family members have had with the use of anesthetic medicines.  Any blood disorders you have.  Any surgeries you have had.  Any medical conditions you have.  Whether you are pregnant or may be pregnant. What are the risks? Generally, this is a safe procedure. However, problems may occur, including:  Excessive bleeding.  Infection.  Damage to other structures or organs.  Blood clots.  Inability get pregnant (infertility). What happens before the procedure? Staying hydrated Follow instructions from your health care provider about hydration, which may include:  Up to 2 hours before the procedure - you may continue to drink clear liquids, such as water, clear fruit juice, black coffee, and plain tea. Eating and drinking restrictions Follow instructions from your health  care provider about eating and drinking, which may include:  8 hours before the procedure - stop eating and drinking everything except clear liquids.  6 hours before the procedure - stop eating light meals or foods, such as toast or cereal.  6 hours before the procedure - stop drinking milk or drinks that contain milk.  2 hours before the procedure - stop drinking clear liquids. Medicines  Ask your health care provider about: ? Changing or stopping your regular medicines. This is especially important if you are taking diabetes medicines or blood thinners. ? Taking medicines such as aspirin and ibuprofen. These medicines can thin your blood. Do not take these medicines before your procedure if your health care provider instructs you not to.  You may be given antibiotic medicine to help prevent infection. General instructions  Do not use any products that contain nicotine or tobacco, such as cigarettes and e-cigarettes, for 2 weeks before the procedure. If you need help quitting, ask your health care provider.  Ask your health care provider how your surgical site will be marked or identified.  Plan to have someone take you home from the hospital or clinic.  Plan to have someone help with household activities for 1-2 weeks after the procedure.  You may be asked to shower with a germ-killing soap.  Let your health care provider know if you develop a cold or any infection before your surgery. What happens during the procedure?  To reduce your risk of infection: ? Your health care team will wash or sanitize their hands. ? Your skin will be washed with soap. ? Hair may be removed from the surgical area.  An IV will be inserted into one of your veins.  You will be given one or more of the following: ? A medicine to help you relax (sedative). ? A medicine to make you fall asleep (general anesthetic).  Small monitors will be attached to your body. They will be used to check your  heart, blood pressure, and oxygen level.  A breathing tube will be placed into your lungs during the procedure.  Your surgeon will use one of the following methods to perform the surgery: Laparoscopic technique  Several small incisions will be made in your abdomen. These will typically be about 1 to 2 cm long.  Your abdomen will be filled with carbon dioxide gas to make it expand. This will give the surgeon more room to operate. It will also make your organs easier to see.  A thin, lighted tube with a camera (laparoscope) will be put through one of the small incisions. The laparoscope will send a picture to a TV screen in the operating room. This will give the surgeon a good view of your organs.  Hollow tubes will be put through the other small incisions in your abdomen. The tools needed for the procedure will be put through these tubes.  The ovary with the cyst will be identified, and the cyst will be removed. It will then be sent to the lab for testing. If the cyst  has cancer cells, both ovaries may need to be removed during a different surgery.  Tools will be removed. The incisions will then be closed with stitches or skin glue. Bandages (dressings) may be applied. Open abdominal technique  A single, large incision will be made along your bikini line or in the middle of your lower abdomen.  The ovary with the cyst will be identified, and the cyst will be removed. It will then be sent to the lab for testing. If the cyst has cancer cells, both ovaries may need to be removed during a different surgery.  The incision will then be closed with stitches or staples.  Bandages (dressings) may be applied. The procedure may vary among health care providers and hospitals. What happens after the procedure?  Your blood pressure, heart rate, breathing rate, and blood oxygen level will be monitored until the medicines you were given have worn off.  Your IV access will be removed after you are  able to eat and drink well.  You may be given medicine for pain or to help you sleep.  You may be given an antibiotic medicine.  Do not drive for 24 hours if you were given a sedative. Summary  Ovarian cystectomy is a procedure that is done to remove a cyst on an ovary.  This procedure may be done for cysts that are large, cause symptoms, or do not go away on their own. It may also be done for a cyst that is cancerous or might be cancerous.  Follow instructions from your health care provider about eating and drinking before the procedure.  After the cyst is removed, it will be sent to the lab for testing. This information is not intended to replace advice given to you by your health care provider. Make sure you discuss any questions you have with your health care provider. Document Released: 12/05/2006 Document Revised: 01/20/2017 Document Reviewed: 03/29/2016 Elsevier Patient Education  2020 [employer].
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
## 2018-11-20 NOTE — Progress Notes (Signed)
Patient here to discuss ultrasound results, stopped Taytulla 2 days ago due to running out of pills.

## 2018-11-22 ENCOUNTER — Encounter: Payer: Self-pay | Admitting: Certified Nurse Midwife

## 2018-11-22 ENCOUNTER — Telehealth: Payer: Self-pay

## 2018-11-22 NOTE — Telephone Encounter (Signed)
Pt requested to see md- [facility].  Appt made for 10/21 at 10:45.

## 2018-12-11 ENCOUNTER — Emergency Department: Payer: 59

## 2018-12-11 ENCOUNTER — Other Ambulatory Visit: Payer: Self-pay

## 2018-12-11 ENCOUNTER — Emergency Department
Admission: EM | Admit: 2018-12-11 | Discharge: 2018-12-12 | Disposition: A | Discharge status: Home / Self Care | Payer: 59 | Attending: Emergency Medicine | Admitting: Emergency Medicine

## 2018-12-11 ENCOUNTER — Encounter: Payer: Self-pay | Admitting: Emergency Medicine

## 2018-12-11 DIAGNOSIS — N83209 Unspecified ovarian cyst, unspecified side: Secondary | ICD-10-CM | POA: Diagnosis not present

## 2018-12-11 DIAGNOSIS — R1011 Right upper quadrant pain: Secondary | ICD-10-CM | POA: Insufficient documentation

## 2018-12-11 DIAGNOSIS — R109 Unspecified abdominal pain: Secondary | ICD-10-CM | POA: Diagnosis present

## 2018-12-11 DIAGNOSIS — R102 Pelvic and perineal pain: Secondary | ICD-10-CM

## 2018-12-11 LAB — CBC WITH DIFFERENTIAL/PLATELET
Abs Immature Granulocytes: 0.02 10*3/uL (ref 0.00–0.07)
Basophils Absolute: 0.1 10*3/uL (ref 0.0–0.1)
Basophils Relative: 1 %
Eosinophils Absolute: 0.2 10*3/uL (ref 0.0–0.5)
Eosinophils Relative: 2 %
HCT: 44.1 % (ref 36.0–46.0)
Hemoglobin: 15.3 g/dL — ABNORMAL HIGH (ref 12.0–15.0)
Immature Granulocytes: 0 %
Lymphocytes Relative: 29 %
Lymphs Abs: 2.7 10*3/uL (ref 0.7–4.0)
MCH: 31.9 pg (ref 26.0–34.0)
MCHC: 34.7 g/dL (ref 30.0–36.0)
MCV: 91.9 fL (ref 80.0–100.0)
Monocytes Absolute: 0.5 10*3/uL (ref 0.1–1.0)
Monocytes Relative: 5 %
Neutro Abs: 5.7 10*3/uL (ref 1.7–7.7)
Neutrophils Relative %: 63 %
Platelets: 315 10*3/uL (ref 150–400)
RBC: 4.8 MIL/uL (ref 3.87–5.11)
RDW: 11.5 % (ref 11.5–15.5)
WBC: 9.2 10*3/uL (ref 4.0–10.5)
nRBC: 0 % (ref 0.0–0.2)

## 2018-12-11 LAB — URINALYSIS, COMPLETE (UACMP) WITH MICROSCOPIC
Bilirubin Urine: NEGATIVE
Glucose, UA: NEGATIVE mg/dL
Ketones, ur: NEGATIVE mg/dL
Leukocytes,Ua: NEGATIVE
Nitrite: NEGATIVE
Protein, ur: NEGATIVE mg/dL
Specific Gravity, Urine: 1.009 (ref 1.005–1.030)
pH: 6 (ref 5.0–8.0)

## 2018-12-11 LAB — COMPREHENSIVE METABOLIC PANEL
ALT: 94 U/L — ABNORMAL HIGH (ref 0–44)
AST: 41 U/L (ref 15–41)
Albumin: 4.5 g/dL (ref 3.5–5.0)
Alkaline Phosphatase: 107 U/L (ref 38–126)
Anion gap: 11 (ref 5–15)
BUN: 12 mg/dL (ref 6–20)
CO2: 24 mmol/L (ref 22–32)
Calcium: 9.5 mg/dL (ref 8.9–10.3)
Chloride: 104 mmol/L (ref 98–111)
Creatinine, Ser: 0.76 mg/dL (ref 0.44–1.00)
GFR calc Af Amer: >60 mL/min (ref >60)
GFR calc non Af Amer: >60 mL/min (ref >60)
Glucose, Bld: 100 mg/dL — ABNORMAL HIGH (ref 70–99)
Potassium: 3.8 mmol/L (ref 3.5–5.1)
Sodium: 139 mmol/L (ref 135–145)
Total Bilirubin: 0.5 mg/dL (ref 0.3–1.2)
Total Protein: 8 g/dL (ref 6.5–8.1)

## 2018-12-11 LAB — POCT PREGNANCY, URINE: Preg Test, Ur: NEGATIVE

## 2018-12-11 LAB — LIPASE, BLOOD: Lipase: 27 U/L (ref 11–51)

## 2018-12-11 MED ORDER — TRAMADOL HCL 50 MG PO TABS: 50.0000 mg | ORAL_TABLET | Freq: Four times a day (QID) | ORAL | 0 refills | Status: AC | PRN

## 2018-12-11 MED ORDER — ONDANSETRON 8 MG PO TBDP: 8.0000 mg | ORAL_TABLET | Freq: Three times a day (TID) | ORAL | 0 refills | Status: DC | PRN

## 2018-12-11 MED ORDER — OXYCODONE-ACETAMINOPHEN 5-325 MG PO TABS
1.0000 | ORAL_TABLET | Freq: Once | ORAL | Status: AC
  Administered 2018-12-11: 23:00:00 1 via ORAL
  Filled 2018-12-11: qty 1

## 2018-12-11 MED ORDER — OMEPRAZOLE 40 MG PO CPDR: 40.0000 mg | DELAYED_RELEASE_CAPSULE | Freq: Every day | ORAL | 0 refills | Status: DC

## 2018-12-11 NOTE — ED Triage Notes (Signed)
Patient ambulatory to triage with steady gait, without difficulty or distress noted, mask inp lace; pt reports hx left ovarian cyst, cholecystectomy in Jan; st having recurrent symptoms of rt sided abd pain accomp by nausea & diarrhea

## 2018-12-11 NOTE — ED Notes (Signed)
Report given to Forbes Cellar, RN. Patient is in ultrasound at this time.

## 2018-12-11 NOTE — ED Notes (Signed)
Warm blankets given to patient.

## 2018-12-11 NOTE — Discharge Instructions (Signed)
Follow-up with your OB/GYN.  Return to the ER for new, worsening, or persistent severe upper or lower abdominal pain, vomiting, fever, weakness, or any other new or worsening symptoms that concern you.
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
## 2018-12-12 ENCOUNTER — Encounter: Payer: 59 | Admitting: Obstetrics and Gynecology

## 2018-12-12 NOTE — ED Provider Notes (Addendum)
----------------------------------------- 2:36 AM on 12/12/2018 -----------------------------------------   Blood pressure (!) 104/56, pulse 78, temperature 98.6 F (37 C), temperature source Oral, resp. rate 18, height 5\' 5"  (1.651 m), weight 92.5 kg, last menstrual period 12/09/2018, SpO2 99 %, not currently breastfeeding.  Assuming care from Dr. Cherylann Banas of [facility] is a 23 y.o. female with a chief complaint of Abdominal Pain .    Please refer to H&P by previous MD for further details.  The current plan of care is to f/u US which showed a stable L ovarian cyst with no torsion. Pain well controlled. Patient will be dc with prescriptions and instructions left by Dr. Cherylann Banas   I have personally reviewed the images performed during this visit and I agree with the Radiologist's read.   Interpretation by Radiologist:  US Pelvis (transabdominal Only)  Result Date: 12/11/2018 CLINICAL DATA:  Pelvic pain, known ovarian cyst. EXAM: TRANSABDOMINAL ULTRASOUND OF PELVIS DOPPLER ULTRASOUND OF OVARIES TECHNIQUE: Transabdominal ultrasound examination of the pelvis was performed including evaluation of the uterus, ovaries, adnexal regions, and pelvic cul-de-sac. Color and duplex Doppler ultrasound was utilized to evaluate blood flow to the ovaries. COMPARISON:  Pelvic ultrasound 11/20/2018, additional priors. FINDINGS: Uterus Measurements: 8.3 x 2.7 x 4.5 cm = volume: 53 mL. No fibroids or other mass visualized. Endometrium Thickness: 5 mm.  No focal abnormality visualized. Right ovary Measurements: 3.0 x 1.9 x 2.6 cm = volume: 7.5 mL. Normal appearance/no adnexal mass. Left ovary Measurements: 7.5 x 4.6 x 5.1 cm = volume: 91 mL. There is a 6.5 x 3.7 x 5.0 cm cyst in the left ovary that appears similar in size to most recent prior exam. Blood flow is noted to the ovarian parenchyma. Pulsed Doppler evaluation demonstrates normal low-resistance arterial and venous waveforms in both ovaries. Other: No  pelvic free fluid. IMPRESSION: 1. Left ovarian 6.5 cm cyst is not significantly changed in size from comparison exam 3 weeks ago. Normal blood flow to the ovarian parenchyma without torsion. 2. Normal sonographic appearance of the right ovary and uterus. Electronically Signed   By: Keith Rake M.D.   On: 12/11/2018 23:35   US Pelvic Doppler (torsion R/o Or Mass Arterial Flow)  Result Date: 12/11/2018 CLINICAL DATA:  Pelvic pain, known ovarian cyst. EXAM: TRANSABDOMINAL ULTRASOUND OF PELVIS DOPPLER ULTRASOUND OF OVARIES TECHNIQUE: Transabdominal ultrasound examination of the pelvis was performed including evaluation of the uterus, ovaries, adnexal regions, and pelvic cul-de-sac. Color and duplex Doppler ultrasound was utilized to evaluate blood flow to the ovaries. COMPARISON:  Pelvic ultrasound 11/20/2018, additional priors. FINDINGS: Uterus Measurements: 8.3 x 2.7 x 4.5 cm = volume: 53 mL. No fibroids or other mass visualized. Endometrium Thickness: 5 mm.  No focal abnormality visualized. Right ovary Measurements: 3.0 x 1.9 x 2.6 cm = volume: 7.5 mL. Normal appearance/no adnexal mass. Left ovary Measurements: 7.5 x 4.6 x 5.1 cm = volume: 91 mL. There is a 6.5 x 3.7 x 5.0 cm cyst in the left ovary that appears similar in size to most recent prior exam. Blood flow is noted to the ovarian parenchyma. Pulsed Doppler evaluation demonstrates normal low-resistance arterial and venous waveforms in both ovaries. Other: No pelvic free fluid. IMPRESSION: 1. Left ovarian 6.5 cm cyst is not significantly changed in size from comparison exam 3 weeks ago. Normal blood flow to the ovarian parenchyma without torsion. 2. Normal sonographic appearance of the right ovary and uterus. Electronically Signed   By: Keith Rake M.D.   On: 12/11/2018 23:35  Alfred Levins, [place], MD 12/12/18 [place], [place], [place] 12/12/18 [phone]

## 2018-12-21 ENCOUNTER — Ambulatory Visit (INDEPENDENT_AMBULATORY_CARE_PROVIDER_SITE_OTHER): Payer: 59 | Admitting: Obstetrics and Gynecology

## 2018-12-21 ENCOUNTER — Encounter: Payer: Self-pay | Admitting: Obstetrics and Gynecology

## 2018-12-21 ENCOUNTER — Other Ambulatory Visit
Admission: RE | Admit: 2018-12-21 | Discharge: 2018-12-21 | Disposition: A | Discharge status: Home / Self Care | Payer: 59 | Source: Ambulatory Visit | Attending: Obstetrics and Gynecology | Admitting: Obstetrics and Gynecology

## 2018-12-21 ENCOUNTER — Other Ambulatory Visit: Payer: Self-pay

## 2018-12-21 VITALS — BP 127/81 | HR 97 | Ht 65.0 in | Wt 201.4 lb

## 2018-12-21 DIAGNOSIS — Z20828 Contact with and (suspected) exposure to other viral communicable diseases: Secondary | ICD-10-CM | POA: Diagnosis not present

## 2018-12-21 DIAGNOSIS — N83202 Unspecified ovarian cyst, left side: Secondary | ICD-10-CM | POA: Diagnosis not present

## 2018-12-21 DIAGNOSIS — Z01812 Encounter for preprocedural laboratory examination: Secondary | ICD-10-CM | POA: Diagnosis not present

## 2018-12-21 MED ORDER — ACETAMINOPHEN-CODEINE #3 300-30 MG PO TABS: 1.0000 | ORAL_TABLET | Freq: Four times a day (QID) | ORAL | 0 refills | Status: DC | PRN
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
## 2018-12-21 NOTE — Progress Notes (Addendum)
Pt presents for evaluation of left ovarian cyst and surgical options discussion. She reports a few months ago she noticed a sharp LLQ pain which would self resolve, gradually became worse. She has been on OCPs. Had an [place] in September which showed a left ovarian cyst measuring  5.4 x 4.4 x 3.8 cm. On 10/20 she went to the ED for LLQ and RUQ pain and had a TAUS showing the ovarian cyst measuring 6.5 x 3.7 x 5.0 cm . She states that For 3 weeks now pain has been a constant dull vs sharp pain that sometimes radiates down left leg. She would like to discuss surgical management options today.    Surgical history includes a laparascopic cholecystectomy in January 2020.

## 2018-12-21 NOTE — Patient Instructions (Signed)
Ovarian Cystectomy    Ovarian cystectomy is a procedure that is done to remove a fluid-filled sac (cyst) on an ovary. The ovaries are small organs that produce eggs in women. Various types of cysts can form on the ovaries. Most are not cancerous. This procedure may be done for cysts that are large, cause symptoms, or do not go away on their own. It may also be done for a cyst that is cancerous or might be cancerous.  This surgery can be done using a laparoscopic technique or an open abdominal technique. The laparoscopic technique involves smaller incisions and results in a faster recovery time. The technique used will depend on your age, the type of cyst that you have, and whether the cyst is cancerous. The laparoscopic technique is not used for a cancerous cyst.  Tell a health care provider about:  · Any allergies you have.  · All medicines you are taking, including vitamins, herbs, eye drops, creams, and over-the-counter medicines.  · Any problems you or family members have had with the use of anesthetic medicines.  · Any blood disorders you have.  · Any surgeries you have had.  · Any medical conditions you have.  · Whether you are pregnant or may be pregnant.  What are the risks?  Generally, this is a safe procedure. However, problems may occur, including:  · Excessive bleeding.  · Infection.  · Damage to other structures or organs.  · Blood clots.  · Inability get pregnant (infertility).  What happens before the procedure?  Staying hydrated  Follow instructions from your health care provider about hydration, which may include:  · Up to 2 hours before the procedure - you may continue to drink clear liquids, such as water, clear fruit juice, black coffee, and plain tea.  Eating and drinking restrictions  Follow instructions from your health care provider about eating and drinking, which may include:  · 8 hours before the procedure - stop eating and drinking everything except clear liquids.  · 6 hours before the  procedure - stop eating light meals or foods, such as toast or cereal.  · 6 hours before the procedure - stop drinking milk or drinks that contain milk.  · 2 hours before the procedure - stop drinking clear liquids.  Medicines  · Ask your health care provider about:  ? Changing or stopping your regular medicines. This is especially important if you are taking diabetes medicines or blood thinners.  ? Taking medicines such as aspirin and ibuprofen. These medicines can thin your blood. Do not take these medicines before your procedure if your health care provider instructs you not to.  · You may be given antibiotic medicine to help prevent infection.  General instructions  · Do not use any products that contain nicotine or tobacco, such as cigarettes and e-cigarettes, for 2 weeks before the procedure. If you need help quitting, ask your health care provider.  · Ask your health care provider how your surgical site will be marked or identified.  · Plan to have someone take you home from the hospital or clinic.  · Plan to have someone help with household activities for 1-2 weeks after the procedure.  · You may be asked to shower with a germ-killing soap.  · Let your health care provider know if you develop a cold or any infection before your surgery.  What happens during the procedure?  · To reduce your risk of infection:  ? Your health care team will   following: ? A medicine to help you relax (sedative). ? A medicine to make you fall asleep (general anesthetic).  Small monitors will be attached to your body. They will be used to check your heart, blood pressure, and oxygen level.  A breathing tube will be placed into your lungs during the procedure.  Your surgeon will use one of the following methods to perform  the surgery: Laparoscopic technique  Several small incisions will be made in your abdomen. These will typically be about 1 to 2 cm long.  Your abdomen will be filled with carbon dioxide gas to make it expand. This will give the surgeon more room to operate. It will also make your organs easier to see.  A thin, lighted tube with a camera (laparoscope) will be put through one of the small incisions. The laparoscope will send a picture to a TV screen in the operating room. This will give the surgeon a good view of your organs.  Hollow tubes will be put through the other small incisions in your abdomen. The tools needed for the procedure will be put through these tubes.  The ovary with the cyst will be identified, and the cyst will be removed. It will then be sent to the lab for testing. If the cyst has cancer cells, both ovaries may need to be removed during a different surgery.  Tools will be removed. The incisions will then be closed with stitches or skin glue. Bandages (dressings) may be applied. Open abdominal technique  A single, large incision will be made along your bikini line or in the middle of your lower abdomen.  The ovary with the cyst will be identified, and the cyst will be removed. It will then be sent to the lab for testing. If the cyst has cancer cells, both ovaries may need to be removed during a different surgery.  The incision will then be closed with stitches or staples.  Bandages (dressings) may be applied. The procedure may vary among health care providers and hospitals. What happens after the procedure?  Your blood pressure, heart rate, breathing rate, and blood oxygen level will be monitored until the medicines you were given have worn off.  Your IV access will be removed after you are able to eat and drink well.  You may be given medicine for pain or to help you sleep.  You may be given an antibiotic medicine.  Do not drive for 24 hours if you were given a  sedative. Summary  Ovarian cystectomy is a procedure that is done to remove a cyst on an ovary.  This procedure may be done for cysts that are large, cause symptoms, or do not go away on their own. It may also be done for a cyst that is cancerous or might be cancerous.  Follow instructions from your health care provider about eating and drinking before the procedure.  After the cyst is removed, it will be sent to the lab for testing. This information is not intended to replace advice given to you by your health care provider. Make sure you discuss any questions you have with your health care provider. Document Released: 12/05/2006 Document Revised: 01/20/2017 Document Reviewed: 03/29/2016 Elsevier Patient Education  2020 [employer]  
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
## 2018-12-21 NOTE — Progress Notes (Signed)
Pt is present to discuss surgery. Pt stated that she is having a lot of pain off and on from ovary cyst.

## 2018-12-22 LAB — SARS CORONAVIRUS 2 (TAT 6-24 HRS): SARS Coronavirus 2: NEGATIVE

## 2018-12-24 ENCOUNTER — Ambulatory Visit
Admission: RE | Admit: 2018-12-24 | Discharge: 2018-12-24 | Disposition: A | Discharge status: Home / Self Care | Payer: 59 | Attending: Obstetrics and Gynecology | Admitting: Obstetrics and Gynecology

## 2018-12-24 ENCOUNTER — Other Ambulatory Visit: Payer: Self-pay

## 2018-12-24 ENCOUNTER — Encounter
Admission: RE | Disposition: A | Discharge status: Home / Self Care | Payer: Self-pay | Source: Home / Self Care | Attending: Obstetrics and Gynecology

## 2018-12-24 ENCOUNTER — Ambulatory Visit: Payer: 59 | Admitting: Anesthesiology

## 2018-12-24 ENCOUNTER — Encounter: Payer: Self-pay | Admitting: *Deleted

## 2018-12-24 DIAGNOSIS — N83202 Unspecified ovarian cyst, left side: Secondary | ICD-10-CM | POA: Diagnosis not present

## 2018-12-24 DIAGNOSIS — R102 Pelvic and perineal pain: Secondary | ICD-10-CM

## 2018-12-24 DIAGNOSIS — D271 Benign neoplasm of left ovary: Secondary | ICD-10-CM

## 2018-12-24 HISTORY — PX: LAPAROSCOPIC OVARIAN CYSTECTOMY: SHX6248

## 2018-12-24 LAB — POCT PREGNANCY, URINE: Preg Test, Ur: NEGATIVE

## 2018-12-24 SURGERY — EXCISION, CYST, OVARY, LAPAROSCOPIC
Anesthesia: General | Laterality: Left

## 2018-12-24 MED ORDER — ONDANSETRON HCL 4 MG/2ML IJ SOLN
INTRAMUSCULAR | Status: DC | PRN
  Administered 2018-12-24: 4 mg via INTRAVENOUS

## 2018-12-24 MED ORDER — FENTANYL CITRATE (PF) 100 MCG/2ML IJ SOLN
INTRAMUSCULAR | Status: AC
  Filled 2018-12-24: qty 2

## 2018-12-24 MED ORDER — FENTANYL CITRATE (PF) 100 MCG/2ML IJ SOLN
25.0000 ug | INTRAMUSCULAR | Status: DC | PRN
  Administered 2018-12-24 (×4): 50 ug via INTRAVENOUS

## 2018-12-24 MED ORDER — ACETAMINOPHEN 500 MG PO TABS
1000.0000 mg | ORAL_TABLET | ORAL | Status: AC
  Administered 2018-12-24: 09:00:00 1000 mg via ORAL

## 2018-12-24 MED ORDER — SUGAMMADEX SODIUM 200 MG/2ML IV SOLN
INTRAVENOUS | Status: DC | PRN
  Administered 2018-12-24: 200 mg via INTRAVENOUS

## 2018-12-24 MED ORDER — KETOROLAC TROMETHAMINE 30 MG/ML IJ SOLN
30.0000 mg | Freq: Once | INTRAMUSCULAR | Status: AC
  Administered 2018-12-24: 15:00:00 30 mg via INTRAVENOUS

## 2018-12-24 MED ORDER — BUPIVACAINE HCL 0.5 % IJ SOLN
INTRAMUSCULAR | Status: DC | PRN
  Administered 2018-12-24: 10 mL

## 2018-12-24 MED ORDER — ACETAMINOPHEN 500 MG PO TABS
ORAL_TABLET | ORAL | Status: AC
  Filled 2018-12-24: qty 2

## 2018-12-24 MED ORDER — HYDROCODONE-ACETAMINOPHEN 5-325 MG PO TABS: 1.0000 | ORAL_TABLET | Freq: Four times a day (QID) | ORAL | 0 refills | Status: DC | PRN

## 2018-12-24 MED ORDER — MIDAZOLAM HCL 2 MG/2ML IJ SOLN
INTRAMUSCULAR | Status: AC
  Filled 2018-12-24: qty 2

## 2018-12-24 MED ORDER — LACTATED RINGERS IV SOLN
INTRAVENOUS | Status: DC
  Administered 2018-12-24: 09:00:00 via INTRAVENOUS

## 2018-12-24 MED ORDER — PROPOFOL 10 MG/ML IV BOLUS
INTRAVENOUS | Status: DC | PRN
  Administered 2018-12-24: 150 mg via INTRAVENOUS

## 2018-12-24 MED ORDER — LIDOCAINE HCL (CARDIAC) PF 100 MG/5ML IV SOSY
PREFILLED_SYRINGE | INTRAVENOUS | Status: DC | PRN
  Administered 2018-12-24: 80 mg via INTRAVENOUS

## 2018-12-24 MED ORDER — LACTATED RINGERS IV SOLN: INTRAVENOUS | Status: DC

## 2018-12-24 MED ORDER — OXYCODONE HCL 5 MG PO TABS
5.0000 mg | ORAL_TABLET | Freq: Once | ORAL | Status: AC | PRN
  Administered 2018-12-24: 5 mg via ORAL

## 2018-12-24 MED ORDER — MIDAZOLAM HCL 2 MG/2ML IJ SOLN
INTRAMUSCULAR | Status: DC | PRN
  Administered 2018-12-24: 2 mg via INTRAVENOUS

## 2018-12-24 MED ORDER — OXYCODONE HCL 5 MG/5ML PO SOLN: 5.0000 mg | Freq: Once | ORAL | Status: AC | PRN

## 2018-12-24 MED ORDER — CELECOXIB 200 MG PO CAPS
400.0000 mg | ORAL_CAPSULE | ORAL | Status: AC
  Administered 2018-12-24: 400 mg via ORAL

## 2018-12-24 MED ORDER — KETOROLAC TROMETHAMINE 30 MG/ML IJ SOLN
INTRAMUSCULAR | Status: AC
  Administered 2018-12-24: 15:00:00 30 mg via INTRAVENOUS
  Filled 2018-12-24: qty 1

## 2018-12-24 MED ORDER — CELECOXIB 200 MG PO CAPS
ORAL_CAPSULE | ORAL | Status: AC
  Filled 2018-12-24: qty 2

## 2018-12-24 MED ORDER — PROPOFOL 10 MG/ML IV BOLUS
INTRAVENOUS | Status: AC
  Filled 2018-12-24: qty 20

## 2018-12-24 MED ORDER — OXYCODONE HCL 5 MG PO TABS
ORAL_TABLET | ORAL | Status: AC
  Filled 2018-12-24: qty 1

## 2018-12-24 MED ORDER — GABAPENTIN 300 MG PO CAPS
300.0000 mg | ORAL_CAPSULE | ORAL | Status: AC
  Administered 2018-12-24: 300 mg via ORAL

## 2018-12-24 MED ORDER — ROCURONIUM BROMIDE 100 MG/10ML IV SOLN
INTRAVENOUS | Status: DC | PRN
  Administered 2018-12-24: 40 mg via INTRAVENOUS

## 2018-12-24 MED ORDER — IBUPROFEN 800 MG PO TABS: 800.0000 mg | ORAL_TABLET | Freq: Three times a day (TID) | ORAL | 1 refills | Status: DC | PRN

## 2018-12-24 MED ORDER — DEXAMETHASONE SODIUM PHOSPHATE 10 MG/ML IJ SOLN
INTRAMUSCULAR | Status: DC | PRN
  Administered 2018-12-24: 10 mg via INTRAVENOUS

## 2018-12-24 MED ORDER — BUPIVACAINE HCL (PF) 0.5 % IJ SOLN
INTRAMUSCULAR | Status: AC
  Filled 2018-12-24: qty 30

## 2018-12-24 MED ORDER — GABAPENTIN 300 MG PO CAPS
ORAL_CAPSULE | ORAL | Status: AC
  Filled 2018-12-24: qty 1

## 2018-12-24 MED ORDER — FENTANYL CITRATE (PF) 100 MCG/2ML IJ SOLN
INTRAMUSCULAR | Status: DC | PRN
  Administered 2018-12-24: 25 ug via INTRAVENOUS
  Administered 2018-12-24: 50 ug via INTRAVENOUS
  Administered 2018-12-24: 25 ug via INTRAVENOUS
  Administered 2018-12-24 (×2): 50 ug via INTRAVENOUS

## 2018-12-24 SURGICAL SUPPLY — 39 items
ANCHOR TIS RET SYS 235ML (MISCELLANEOUS) ×3 IMPLANT
BLADE SURG SZ11 CARB STEEL (BLADE) ×3 IMPLANT
CANISTER SUCT 1200ML W/VALVE (MISCELLANEOUS) ×3 IMPLANT
CATH ROBINSON RED A/P 16FR (CATHETERS) ×3 IMPLANT
CHLORAPREP W/TINT 26 (MISCELLANEOUS) ×3 IMPLANT
CORD MONOPOLAR M/FML 12FT (MISCELLANEOUS) IMPLANT
COVER WAND RF STERILE (DRAPES) IMPLANT
DERMABOND ADVANCED (GAUZE/BANDAGES/DRESSINGS) ×2
DERMABOND ADVANCED .7 DNX12 (GAUZE/BANDAGES/DRESSINGS) ×1 IMPLANT
GLOVE BIO SURGEON STRL SZ 6.5 (GLOVE) ×2 IMPLANT
GLOVE BIO SURGEON STRL SZ8 (GLOVE) ×3 IMPLANT
GLOVE BIO SURGEONS STRL SZ 6.5 (GLOVE) ×1
GLOVE INDICATOR 7.0 STRL GRN (GLOVE) ×3 IMPLANT
GOWN STRL REUS W/ TWL LRG LVL3 (GOWN DISPOSABLE) ×2 IMPLANT
GOWN STRL REUS W/TWL LRG LVL3 (GOWN DISPOSABLE) ×4
GOWN STRL REUS W/TWL XL LVL4 (GOWN DISPOSABLE) ×3 IMPLANT
GRASPER SUT TROCAR 14GX15 (MISCELLANEOUS) IMPLANT
IRRIGATION STRYKERFLOW (MISCELLANEOUS) ×1 IMPLANT
IRRIGATOR STRYKERFLOW (MISCELLANEOUS) ×3
IV LACTATED RINGERS 1000ML (IV SOLUTION) ×3 IMPLANT
KIT PINK PAD W/HEAD ARE REST (MISCELLANEOUS) ×3
KIT PINK PAD W/HEAD ARM REST (MISCELLANEOUS) ×1 IMPLANT
KIT TURNOVER CYSTO (KITS) ×3 IMPLANT
NS IRRIG 500ML POUR BTL (IV SOLUTION) ×3 IMPLANT
PACK GYN LAPAROSCOPIC (MISCELLANEOUS) ×3 IMPLANT
PAD OB MATERNITY 4.3X12.25 (PERSONAL CARE ITEMS) ×3 IMPLANT
PAD PREP 24X41 OB/GYN DISP (PERSONAL CARE ITEMS) ×3 IMPLANT
POUCH ENDO CATCH 10MM SPEC (MISCELLANEOUS) IMPLANT
POUCH SPECIMEN RETRIEVAL 10MM (ENDOMECHANICALS) IMPLANT
SCISSORS METZENBAUM CVD 33 (INSTRUMENTS) IMPLANT
SET TUBE SMOKE EVAC HIGH FLOW (TUBING) ×3 IMPLANT
SHEARS HARMONIC ACE PLUS 36CM (ENDOMECHANICALS) ×3 IMPLANT
SLEEVE ENDOPATH XCEL 5M (ENDOMECHANICALS) ×3 IMPLANT
SUT VIC AB 3-0 SH 27 (SUTURE)
SUT VIC AB 3-0 SH 27X BRD (SUTURE) IMPLANT
SUT VICRYL 0 AB UR-6 (SUTURE) ×3 IMPLANT
TROCAR ENDO BLADELESS 11MM (ENDOMECHANICALS) ×3 IMPLANT
TROCAR XCEL NON-BLD 5MMX100MML (ENDOMECHANICALS) ×3 IMPLANT
TROCAR XCEL UNIV SLVE 11M 100M (ENDOMECHANICALS) ×3 IMPLANT
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
## 2018-12-24 NOTE — Transfer of Care (Signed)
Immediate Anesthesia Transfer of Care Note  Patient: Sarah Haney  Procedure(s) Performed: LAPAROSCOPIC OVARIAN CYSTECTOMY (Left )  Patient Location: PACU  Anesthesia Type:General  Level of Consciousness: sedated  Airway & Oxygen Therapy: Patient Spontanous Breathing and Patient connected to face mask oxygen  Post-op Assessment: Report given to RN and Post -op Vital signs reviewed and stable  Post vital signs: Reviewed and stable  Last Vitals:  Vitals Value Taken Time  BP 127/89 12/24/18 1303  Temp    Pulse 78 12/24/18 1309  Resp 19 12/24/18 1309  SpO2 100 % 12/24/18 1309  Vitals shown include unvalidated device data.  Last Pain:  Vitals:   12/24/18 0904  TempSrc: Tympanic  PainSc: 6       Patients Stated Pain Goal: 0 ([id number])  Complications: No apparent anesthesia complications

## 2018-12-24 NOTE — Op Note (Signed)
Procedure(s): LAPAROSCOPIC OVARIAN CYSTECTOMY Procedure Note  Sarah Haney female 23 y.o. 12/24/2018  Indications: The patient is a 23 y.o. [id number] female with a left ovarian cyst, pelvic pain, s/p failed medical management  Pre-operative Diagnosis: Left ovarian cyst, pelvic pain  Post-operative Diagnosis: Same with multiple sites of fecalith material  Surgeon: Rubie Maid, MD  Assistants:  Jeannie Fend, MD.  No other capable assistant available in surgery requiring a high level assistant.  Charleston Ropes, PA-S.  Anesthesia: General endotracheal anesthesia  Findings: The uterus appeared normal size and shape.  Fallopian tubes and right ovary appeared normal. 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 Fecalith material noted, with scattered implants throughout the pelvis. Biopsies taken of right uterosacral and posterior cul-de-sac.   Procedure Details: The patient was seen in the Holding Room. The risks, benefits, complications, treatment options, and expected outcomes were discussed with the patient.  The patient concurred with the proposed plan, giving informed consent.  The site of surgery properly noted/marked. The patient was taken to the Operating Room, identified as Sarah Haney and the procedure verified as Procedure(s) (LRB): LAPAROSCOPIC OVARIAN CYSTECTOMY (Left). A Time Out was held and the above information confirmed.  She was then placed under general anesthesia without difficulty. She was placed in the dorsal lithotomy position, and was prepped and draped in a sterile manner.  A straight catheterization was performed. A sterile speculum was inserted into the vagina and the cervix was grasped at the anterior lip using a single-toothed tenaculum.  A Hulka clamp was placed for uterine manipulation.  The speculum and tenaculum were then removed. After an adequate timeout was performed, attention was turned to the abdomen where an umbilical incision was made with  the scalpel.  The Optiview 5-mm trocar and sleeve were then advanced without difficulty with the laparoscope under direct visualization into the abdomen.  The abdomen was then insufflated with carbon dioxide gas and adequate pneumoperitoneum was obtained. A 5-mm right lower quadrant port and an 11-mm left lower quadrant port were then placed under direct visualization.  A survey of the patient's pelvis and abdomen revealed the findings as above.    Biopsy forceps were then used to take a biopsy of the left uterosacral ligament and the posterior cul-desac where 2 small firm, green, raised multicystic lesions were noted.  Several more lesions were noted scattered on the omentum. Next, the ovary was elevated, and an incision was made over the cyst, to allow drainage.  Clear serous mucinous material was expressed.  Following this the cyst wall was identified and grasped, and was dissected free of the surrounding tissue.  The cyst was placed in a laparoscopic Endocatch bag and removed from the pelvis through the 11-mm port site.   The pneumoperitoneum was then re-established, and the laparoscope was then introduced once again into the abdominal cavity.  The redundant ovarian capsule tissue was excised, and then also removed through the 11-mm port site.  A final survey was performed, where good hemostasis was noted.  Irrigation of the pelvis was performed. The 11-mm trochar was removed and the incision was closed with a PMI-suture closure device.  All other trocars were removed under direct visualization, and the abdomen which was desufflated.    All skin incisions were closed with 4-0 Monocryl subcuticular stitches. The incisions were then injected with a total of 10 cc of 0.5% Sensorcaine.  The incisions were covered with Dermbond. The patient tolerated the procedures well.  All instruments, needles, and sponge counts  were correct x 2. The patient was taken to the recovery room awake, extubated and in stable  condition.   Estimated Blood Loss:  5      Drains: straight catheterization prior to procedure with 400 ml of clear urine         Total IV Fluids: 1300 ml  Specimens: Left ovarian cyst wall and redundant ovarian capsule tissue         Implants: None         Complications:  None; patient tolerated the procedure well.         Disposition: PACU - hemodynamically stable.         Condition: stable   Rubie Maid, MD [facility]

## 2018-12-24 NOTE — Discharge Instructions (Signed)
AMBULATORY SURGERY  DISCHARGE INSTRUCTIONS   1) The drugs that you were given will stay in your system until tomorrow so for the next 24 hours you should not:  A) Drive an automobile B) Make any legal decisions C) Drink any alcoholic beverage   2) You may resume regular meals tomorrow.  Today it is better to start with liquids and gradually work up to solid foods.  You may eat anything you prefer, but it is better to start with liquids, then soup and crackers, and gradually work up to solid foods.   3) Please notify your doctor immediately if you have any unusual bleeding, trouble breathing, redness and pain at the surgery site, drainage, fever, or pain not relieved by medication.    4) Additional Instructions:        Please contact your physician with any problems or Same Day Surgery at [phone], Monday through Friday 6 am to 4 pm, or [place] at [facility] number at [phone].AMBULATORY SURGERY  DISCHARGE INSTRUCTIONS   5) The drugs that you were given will stay in your system until tomorrow so for the next 24 hours you should not:  D) Drive an automobile E) Make any legal decisions F) Drink any alcoholic beverage   6) You may resume regular meals tomorrow.  Today it is better to start with liquids and gradually work up to solid foods.  You may eat anything you prefer, but it is better to start with liquids, then soup and crackers, and gradually work up to solid foods.   7) Please notify your doctor immediately if you have any unusual bleeding, trouble breathing, redness and pain at the surgery site, drainage, fever, or pain not relieved by medication.    8) Additional Instructions:        Please contact your physician with any problems or Same Day Surgery at [phone], Monday through Friday 6 am to 4 pm, or [place] at [facility] number at [phone].Ovarian Cystectomy, Care After This sheet gives you information about how to care  for yourself after your procedure. Your health care provider may also give you more specific instructions. If you have problems or questions, contact your health care provider. What can I expect after the procedure? After the procedure, it is common to have:  Pain in your abdomen, especially at the incision areas. You will be given pain medicines to control the pain.  Tiredness. This is a normal part of the recovery process. Your energy level will return to normal over the next several weeks.  Problems passing stool (constipation). Follow these instructions at home: Medicines  Take over-the-counter and prescription medicines only as told by your health care provider.  If you were prescribed an antibiotic medicine, use it as told by your health care provider. Do not stop using the antibiotic even if you start to feel better.  Do not take aspirin because it can cause bleeding.  Do not drink alcohol while taking prescription pain medicine.  Do not drive or use heavy machinery while taking prescription pain medicine. Incision care   Follow instructions from your health care provider about how to take care of your incisions. Make sure you: ? Wash your hands with soap and water before you change your bandage (dressing). If soap and water are not available, use hand sanitizer. ? Change your dressing as told by your health care provider. ? Leave stitches (sutures), skin glue, or adhesive strips in place. These skin closures may need to stay  in place for 2 weeks or longer. If adhesive strip edges start to loosen and curl up, you may trim the loose edges. Do not remove adhesive strips completely unless your health care provider tells you to do that.  Check your incision areas every day for signs of infection. Check for: ? Redness, swelling, or pain. ? Fluid or blood. ? Warmth. ? Pus or a bad smell.  Do not take baths, swim, or use a hot tub until your health care provider approves. Take  showers instead of baths. Activity  Return to your normal activities and diet as told by your health care provider. Ask your health care provider what activities are safe for you.  Take rest breaks during the day as needed.  Do not drive until your health care provider approves. General instructions  Do not douche, use tampons, or have sexual intercourse until your health care provider says it is okay to do so.  To prevent or treat constipation while you are taking prescription pain medicine, your health care provider may recommend that you: ? Take over-the-counter or prescription medicines. ? Eat foods that are high in fiber, such as fresh fruits and vegetables, whole grains, and beans. ? Drink enough fluid to keep your urine clear or pale yellow. ? Limit foods that are high in fat and processed sugars, such as fried and sweet foods.  Keep all follow-up visits as told by your health care provider. This is important. Contact a health care provider if:  You have a fever.  You feel nauseous or you vomit.  You have pain when you urinate or have blood in your urine.  You have a rash on your body.  You have pain or redness where the IV was inserted.  You have pain that is not relieved with medicine.  You have signs of infection, such as: ? Redness, swelling, or pain around your incisions. ? Fluid or blood coming from your incisions. ? An incision that feels warm to the touch. ? Pus or a bad smell coming from your incisions. Get help right away if:  You have chest pain or shortness of breath.  You feel dizzy or light-headed.  You have increasing abdominal pain that is not relieved with medicines.  You have pain, swelling, or redness in your leg.  Your incision is opening (the edges are not staying together). Summary  After the procedure, it is common to have some pain in your abdomen. You will be given pain medicines to control the pain.  Follow instructions from your  health care provider about how to take care of your incisions.  Do not douche, use tampons, or have sexual intercourse until your health care provider says it is okay to do so.  Keep all follow-up visits as told by your health care provider. This is important. This information is not intended to replace advice given to you by your health care provider. Make sure you discuss any questions you have with your health care provider. Document Released: 11/28/2012 Document Revised: 01/20/2017 Document Reviewed: 03/29/2016 Elsevier Patient Education  2020 [employer].

## 2018-12-24 NOTE — Anesthesia Post-op Follow-up Note (Signed)
Anesthesia QCDR form completed.        

## 2018-12-24 NOTE — Interval H&P Note (Signed)
History and Physical Interval Note:  12/24/2018 10:59 AM  Sarah Haney  has presented today for surgery, with the diagnosis of Left Ovarian Cyst.  The various methods of treatment have been discussed with the patient and family. After consideration of risks, benefits and other options for treatment, the patient has consented to  Procedure(s): LAPAROSCOPIC OVARIAN CYSTECTOMY (Left) as a surgical intervention.  The patient's history has been reviewed, patient examined, no change in status, stable for surgery.  I have reviewed the patient's chart and labs.  Questions were answered to the patient's satisfaction.     Rubie Maid, MD [facility]

## 2018-12-24 NOTE — Anesthesia Procedure Notes (Signed)
Procedure Name: Intubation Date/Time: 12/24/2018 11:23 AM Performed by: Johnna Acosta, CRNA Pre-anesthesia Checklist: Patient identified, Emergency Drugs available, Suction available, Patient being monitored and Timeout performed Patient Re-evaluated:Patient Re-evaluated prior to induction Oxygen Delivery Method: Circle system utilized Preoxygenation: Pre-oxygenation with 100% oxygen Induction Type: IV induction Ventilation: Mask ventilation without difficulty Laryngoscope Size: Miller and 2 Grade View: Grade I Tube type: Oral Tube size: 7.0 mm Number of attempts: 1 Airway Equipment and Method: Stylet Placement Confirmation: ETT inserted through vocal cords under direct vision,  positive ETCO2 and breath sounds checked- equal and bilateral Secured at: 21 cm Tube secured with: Tape Dental Injury: Teeth and Oropharynx as per pre-operative assessment

## 2018-12-24 NOTE — Anesthesia Postprocedure Evaluation (Signed)
Anesthesia Post Note  Patient: QUINNE MINION  Procedure(s) Performed: LAPAROSCOPIC OVARIAN CYSTECTOMY (Left )  Patient location during evaluation: PACU Anesthesia Type: General Level of consciousness: awake and alert Pain management: pain level controlled Vital Signs Assessment: post-procedure vital signs reviewed and stable Respiratory status: spontaneous breathing, nonlabored ventilation, respiratory function stable and patient connected to nasal cannula oxygen Cardiovascular status: blood pressure returned to baseline and stable Postop Assessment: no apparent nausea or vomiting Anesthetic complications: no     Last Vitals:  Vitals:   12/24/18 1345 12/24/18 1349  BP:  (!) 116/94  Pulse: (!) 123 (!) 104  Resp: 12 10  Temp:    SpO2: 100% 99%    Last Pain:  Vitals:   12/24/18 1349  TempSrc:   PainSc: 5                  Precious Haws Brealynn Contino

## 2018-12-25 ENCOUNTER — Encounter: Payer: Self-pay | Admitting: Obstetrics and Gynecology

## 2018-12-25 LAB — SURGICAL PATHOLOGY

## 2019-01-02 ENCOUNTER — Encounter: Payer: Self-pay | Admitting: Obstetrics and Gynecology

## 2019-01-02 ENCOUNTER — Other Ambulatory Visit: Payer: Self-pay

## 2019-01-02 ENCOUNTER — Ambulatory Visit (INDEPENDENT_AMBULATORY_CARE_PROVIDER_SITE_OTHER): Payer: 59 | Admitting: Obstetrics and Gynecology

## 2019-01-02 VITALS — BP 112/76 | HR 114 | Ht 65.0 in | Wt 199.6 lb

## 2019-01-02 DIAGNOSIS — Z8742 Personal history of other diseases of the female genital tract: Secondary | ICD-10-CM

## 2019-01-02 DIAGNOSIS — Z9889 Other specified postprocedural states: Secondary | ICD-10-CM

## 2019-01-02 DIAGNOSIS — R238 Other skin changes: Secondary | ICD-10-CM

## 2019-01-02 DIAGNOSIS — Z4889 Encounter for other specified surgical aftercare: Secondary | ICD-10-CM

## 2019-01-02 NOTE — Progress Notes (Signed)
Pt present for 1 week follow up after surgery. Pt stated right side incision is red but is not warm to the touch.

## 2019-01-02 NOTE — Progress Notes (Signed)
    OBSTETRICS/GYNECOLOGY POST-OPERATIVE CLINIC VISIT  Subjective:     Sarah Haney is a 23 y.o. female who presents to the clinic 1 weeks status post laparoscopic left ovarian cystectomy for adnexal mass. Eating a regular diet without difficulty. Bowel movements are normal. Pain is controlled with current analgesics. Medications being used: prescription NSAID's including Ibuprofen..  Mild pain of left incision site, mild irritation (skin redness around bilateral laparoscopic port sites.  .  The following portions of the patient's history were reviewed and updated as appropriate: allergies, current medications, past family history, past medical history, past social history, past surgical history and problem list.  Review of Systems Pertinent items noted in HPI and remainder of comprehensive ROS otherwise negative.    Objective:    BP 112/76   Pulse (!) 114   Ht 5\' 5"  (1.651 m)   Wt 199 lb 9.6 oz (90.5 kg)   LMP 12/09/2018 (Exact Date)   BMI 33.22 kg/m  General:  alert and no distress  Abdomen: soft, bowel sounds active, non-tender  Incision:   healing well, no drainage, no erythema, no hernia, no seroma, no swelling, no dehiscence, incision well approximated . Mildy erythem a(irritaiton of incision)    Pathology:  . SOFT TISSUE, RIGHT UTEROSACRAL; BIOPSY:  - BENIGN CYSTS WITH DYSTROPHIC CALCIFICATIONS, FOREIGN BODY TYPE GIANT  CELLS, AND PIGMENTED CYST CONTENTS.  - NO ENDOMETRIAL GLANDS OR STROMA IDENTIFIED.  - NEGATIVE FOR ATYPIA AND MALIGNANCY.   B. SOFT TISSUE, POSTERIOR CUL-DE-SAC; BIOPSY:  - BENIGN CYSTS WITH DYSTROPHIC CALCIFICATIONS, FOREIGN BODY TYPE GIANT  CELLS, AND HEMATOIDIN PIGMENT.  - NO ENDOMETRIAL GLANDS OR STROMA IDENTIFIED.  - NEGATIVE FOR ATYPIA AND MALIGNANCY.   C. OVARY, LEFT; CYSTECTOMY:  - BENIGN MUCINOUS CYSTADENOMA.  - BACKGROUND BENIGN OVARIAN PARENCHYMA WITH PHYSIOLOGIC CHANGES.  - NEGATIVE FOR ATYPIA AND MALIGNANCY.   Assessment:    Doing well postoperatively.  S/p laparoscopic ovarian cystectomy.   Plan:   1. Continue any current medications. 2. Wound care discussed.  Advised on Neopsorin or Benadryl cream for mild erythema and irritation.  3. Operative findings again reviewed. Pathology report discussed. 4. Activity restrictions: none 5. Anticipated return to work: now. 6. Follow up: as needed.Return to clinic for any scheduled appointments or for any gynecologic concerns as needed.      Rubie Maid, MD [facility]

## 2019-01-02 NOTE — Patient Instructions (Signed)
Health Maintenance, Female Adopting a healthy lifestyle and getting preventive care are important in promoting health and wellness. Ask your health care provider about:  The right schedule for you to have regular tests and exams.  Things you can do on your own to prevent diseases and keep yourself healthy. What should I know about diet, weight, and exercise? Eat a healthy diet   Eat a diet that includes plenty of vegetables, fruits, low-fat dairy products, and lean protein.  Do not eat a lot of foods that are high in solid fats, added sugars, or sodium. Maintain a healthy weight Body mass index (BMI) is used to identify weight problems. It estimates body fat based on height and weight. Your health care provider can help determine your BMI and help you achieve or maintain a healthy weight. Get regular exercise Get regular exercise. This is one of the most important things you can do for your health. Most adults should:  Exercise for at least 150 minutes each week. The exercise should increase your heart rate and make you sweat (moderate-intensity exercise).  Do strengthening exercises at least twice a week. This is in addition to the moderate-intensity exercise.  Spend less time sitting. Even light physical activity can be beneficial. Watch cholesterol and blood lipids Have your blood tested for lipids and cholesterol at 23 years of age, then have this test every 5 years. Have your cholesterol levels checked more often if:  Your lipid or cholesterol levels are high.  You are older than 23 years of age.  You are at high risk for heart disease. What should I know about cancer screening? Depending on your health history and family history, you may need to have cancer screening at various ages. This may include screening for:  Breast cancer.  Cervical cancer.  Colorectal cancer.  Skin cancer.  Lung cancer. What should I know about heart disease, diabetes, and high blood  pressure? Blood pressure and heart disease  High blood pressure causes heart disease and increases the risk of stroke. This is more likely to develop in people who have high blood pressure readings, are of African descent, or are overweight.  Have your blood pressure checked: ? Every 3-5 years if you are 23-39 years of age. ? Every year if you are 40 years old or older. Diabetes Have regular diabetes screenings. This checks your fasting blood sugar level. Have the screening done:  Once every three years after age 40 if you are at a normal weight and have a low risk for diabetes.  More often and at a younger age if you are overweight or have a high risk for diabetes. What should I know about preventing infection? Hepatitis B If you have a higher risk for hepatitis B, you should be screened for this virus. Talk with your health care provider to find out if you are at risk for hepatitis B infection. Hepatitis C Testing is recommended for:  Everyone born from 1945 through 1965.  Anyone with known risk factors for hepatitis C. Sexually transmitted infections (STIs)  Get screened for STIs, including gonorrhea and chlamydia, if: ? You are sexually active and are younger than 23 years of age. ? You are older than 24 years of age and your health care provider tells you that you are at risk for this type of infection. ? Your sexual activity has changed since you were last screened, and you are at increased risk for chlamydia or gonorrhea. Ask your health care provider if   you are at risk.  Ask your health care provider about whether you are at high risk for HIV. Your health care provider may recommend a prescription medicine to help prevent HIV infection. If you choose to take medicine to prevent HIV, you should first get tested for HIV. You should then be tested every 3 months for as long as you are taking the medicine. Pregnancy  If you are about to stop having your period (premenopausal) and  you may become pregnant, seek counseling before you get pregnant.  Take 400 to 800 micrograms (mcg) of folic acid every day if you become pregnant.  Ask for birth control (contraception) if you want to prevent pregnancy. Osteoporosis and menopause Osteoporosis is a disease in which the bones lose minerals and strength with aging. This can result in bone fractures. If you are 65 years old or older, or if you are at risk for osteoporosis and fractures, ask your health care provider if you should:  Be screened for bone loss.  Take a calcium or vitamin D supplement to lower your risk of fractures.  Be given hormone replacement therapy (HRT) to treat symptoms of menopause. Follow these instructions at home: Lifestyle  Do not use any products that contain nicotine or tobacco, such as cigarettes, e-cigarettes, and chewing tobacco. If you need help quitting, ask your health care provider.  Do not use street drugs.  Do not share needles.  Ask your health care provider for help if you need support or information about quitting drugs. Alcohol use  Do not drink alcohol if: ? Your health care provider tells you not to drink. ? You are pregnant, may be pregnant, or are planning to become pregnant.  If you drink alcohol: ? Limit how much you use to 0-1 drink a day. ? Limit intake if you are breastfeeding.  Be aware of how much alcohol is in your drink. In the [place] one drink equals one 12 oz bottle of beer (355 mL), one 5 oz glass of wine (148 mL), or one 1 oz glass of hard liquor (44 mL). General instructions  Schedule regular health, dental, and eye exams.  Stay current with your vaccines.  Tell your health care provider if: ? You often feel depressed. ? You have ever been abused or do not feel safe at home. Summary  Adopting a healthy lifestyle and getting preventive care are important in promoting health and wellness.  Follow your health care provider's instructions about healthy  diet, exercising, and getting tested or screened for diseases.  Follow your health care provider's instructions on monitoring your cholesterol and blood pressure. This information is not intended to replace advice given to you by your health care provider. Make sure you discuss any questions you have with your health care provider. Document Released: 08/23/2010 Document Revised: 01/31/2018 Document Reviewed: 01/31/2018 Elsevier Patient Education  2020 [employer]  

## 2019-03-06 ENCOUNTER — Encounter: Payer: Self-pay | Admitting: Obstetrics and Gynecology

## 2019-03-06 ENCOUNTER — Ambulatory Visit (INDEPENDENT_AMBULATORY_CARE_PROVIDER_SITE_OTHER): Payer: Managed Care, Other (non HMO) | Admitting: Obstetrics and Gynecology

## 2019-03-06 ENCOUNTER — Other Ambulatory Visit: Payer: Self-pay

## 2019-03-06 VITALS — BP 122/85 | HR 110 | Ht 65.0 in | Wt 205.2 lb

## 2019-03-06 DIAGNOSIS — Z8742 Personal history of other diseases of the female genital tract: Secondary | ICD-10-CM | POA: Diagnosis not present

## 2019-03-06 DIAGNOSIS — M5432 Sciatica, left side: Secondary | ICD-10-CM | POA: Diagnosis not present

## 2019-03-06 DIAGNOSIS — R102 Pelvic and perineal pain: Secondary | ICD-10-CM

## 2019-03-06 NOTE — Progress Notes (Signed)
    GYNECOLOGY PROGRESS NOTE  Subjective:    Patient ID: Sarah Haney, female    DOB: [birth year], 24 y.o.   MRN: [id number]  HPI  Patient is a 24 y.o. G9P2002 female who presents for complaints of pain in the left pelvis/groin region that radiates down her leg. She notes that she was having the same pain prior to her surgery when she had the ovarian cyst (had left ovarian cystectomy in November 2020). Patient notes that the pain lessened but never went away, and recently over the past week she began to feel it more intensely again.   Thinks she may have developed another cyst that has rupturedas her pain escalated and notes she felt a "pop". She is using a heating pad and OTC pain meds. Also reports that her most recent cycle was more painful and heavier. Still wonders if it is possible if she has endometriosis (has a family history).    The following portions of the patient's history were reviewed and updated as appropriate: allergies, current medications, past family history, past medical history, past social history, past surgical history and problem list.  Review of Systems Pertinent items noted in HPI and remainder of comprehensive ROS otherwise negative.   Objective:   Blood pressure 122/85, pulse (!) 110, height 5\' 5"  (1.651 m), weight 205 lb 3.2 oz (93.1 kg), last menstrual period 02/25/2019, not currently breastfeeding. General appearance: alert and no distress Abdomen: soft, non-tender; bowel sounds normal; no masses,  no organomegaly Pelvic: deferred   Assessment:   1. Pelvic pain   2. History of ovarian cyst   3. Sciatica of left side    Plan:   1. Pelvic pain, history of painful cycles.  Patient with symptoms of endometriosis as well as family history, although recent surgery with biopsies did not support diagnosis. Advised that sciatic pain could also be related, or may be a separate issue.  Patient has tried different contraceptive options in the past but unable to  tolerate due to side effects. Offered trial of Freida Busman to assess if endometriosis is present and if medication can help symptoms.  Patient also offered referral to pelvic floor physical therapy, she is ok to try.  2. H/o ovarian cyst, worsening pelvic pain recently. Will order pelvic ultrasound to assess for new cyst development. Will schedule for ultrasound tomorrow.  3. RTC in 4 weeks to reassess symptoms after trial of Orilissa.   A total of 15 minutes were spent face-to-face with the patient during this encounter and over half of that time dealt with counseling and coordination of care.   Rubie Maid, MD [facility]

## 2019-03-06 NOTE — Progress Notes (Signed)
Pt present due to having pain. Pt stated having she was still having the same pain she was having before the surgery which was sharp pain in the abd area that goes down the left leg. Pt stated one day that she felt burning in the abd area then afterwards there were a lot of pain. Pt think it may have been a ovarian cyst. Pt stated her pain level on a scale of 1-10 was a 6. Pt stated that she uses a heating pad off and on throughout the day.

## 2019-03-06 NOTE — Patient Instructions (Signed)
Endometriosis  Endometriosis is a condition in which the tissue that lines the uterus (endometrium) grows outside of its normal location. The tissue may grow in many locations close to the uterus, but it commonly grows on the ovaries, fallopian tubes, vagina, or bowel. When the uterus sheds the endometrium every menstrual cycle, there is bleeding wherever the endometrial tissue is located. This can cause pain because blood is irritating to tissues that are not normally exposed to it. What are the causes? The cause of endometriosis is not known. What increases the risk? You may be more likely to develop endometriosis if you:  Have a family history of endometriosis.  Have never given birth.  Started your period at age 10 or younger.  Have high levels of estrogen in your body.  Were exposed to a certain medicine (diethylstilbestrol) before you were born (in utero).  Had low birth weight.  Were born as a twin, triplet, or other multiple.  Have a BMI of less than 25. BMI is an estimate of body fat and is calculated from height and weight. What are the signs or symptoms? Often, there are no symptoms of this condition. If you do have symptoms, they may:  Vary depending on where your endometrial tissue is growing.  Occur during your menstrual period (most common) or midcycle.  Come and go, or you may go months with no symptoms at all.  Stop with menopause. Symptoms may include:  Pain in the back or abdomen.  Heavier bleeding during periods.  Pain during sex.  Painful bowel movements.  Infertility.  Pelvic pain.  Bleeding more than once a month. How is this diagnosed? This condition is diagnosed based on your symptoms and a physical exam. You may have tests, such as:  Blood tests and urine tests. These may be done to help rule out other possible causes of your symptoms.  Ultrasound, to look for abnormal tissues.  An X-ray of the lower bowel (barium enema).  An  ultrasound that is done through the vagina (transvaginally).  CT scan.  MRI.  Laparoscopy. In this procedure, a lighted, pencil-sized instrument called a laparoscope is inserted into your abdomen through an incision. The laparoscope allows your health care provider to look at the organs inside your body and check for abnormal tissue to confirm the diagnosis. If abnormal tissue is found, your health care provider may remove a small piece of tissue (biopsy) to be examined under a microscope. How is this treated? Treatment for this condition may include:  Medicines to relieve pain, such as NSAIDs.  Hormone therapy. This involves using artificial (synthetic) hormones to reduce endometrial tissue growth. Your health care provider may recommend using a hormonal form of birth control, or other medicines.  Surgery. This may be done to remove abnormal endometrial tissue. ? In some cases, tissue may be removed using a laparoscope and a laser (laparoscopic laser treatment). ? In severe cases, surgery may be done to remove the fallopian tubes, uterus, and ovaries (hysterectomy). Follow these instructions at home:  Take over-the-counter and prescription medicines only as told by your health care provider.  Do not drive or use heavy machinery while taking prescription pain medicine.  Try to avoid activities that cause pain, including sexual activity.  Keep all follow-up visits as told by your health care provider. This is important. Contact a health care provider if:  You have pain in the area between your hip bones (pelvic area) that occurs: ? Before, during, or after your period. ?   In between your period and gets worse during your period. ? During or after sex. ? With bowel movements or urination, especially during your period.  You have problems getting pregnant.  You have a fever. Get help right away if:  You have severe pain that does not get better with medicine.  You have severe  nausea and vomiting, or you cannot eat without vomiting.  You have pain that affects only the lower, right side of your abdomen.  You have abdominal pain that gets worse.  You have abdominal swelling.  You have blood in your stool. This information is not intended to replace advice given to you by your health care provider. Make sure you discuss any questions you have with your health care provider. Document Revised: 01/20/2017 Document Reviewed: 07/11/2015 Elsevier Patient Education  [place].  Pelvic Pain, Female Pelvic pain is pain in your lower abdomen, below your belly button and between your hips. The pain may start suddenly (be acute), keep coming back (be recurring), or last a long time (become chronic). Pelvic pain that lasts longer than 6 months is considered chronic. Pelvic pain may affect your:  Reproductive organs.  Urinary system.  Digestive tract.  Musculoskeletal system. There are many potential causes of pelvic pain. Sometimes, the pain can be a result of digestive or urinary conditions, strained muscles or ligaments, or reproductive conditions. Sometimes the cause of pelvic pain is not known. Follow these instructions at home:   Take over-the-counter and prescription medicines only as told by your health care provider.  Rest as told by your health care provider.  Do not have sex if it hurts.  Keep a journal of your pelvic pain. Write down: ? When the pain started. ? Where the pain is located. ? What seems to make the pain better or worse, such as food or your period (menstrual cycle). ? Any symptoms you have along with the pain.  Keep all follow-up visits as told by your health care provider. This is important. Contact a health care provider if:  Medicine does not help your pain.  Your pain comes back.  You have new symptoms.  You have abnormal vaginal discharge or bleeding, including bleeding after menopause.  You have a fever or  chills.  You are constipated.  You have blood in your urine or stool.  You have foul-smelling urine.  You feel weak or light-headed. Get help right away if:  You have sudden severe pain.  Your pain gets steadily worse.  You have severe pain along with fever, nausea, vomiting, or excessive sweating.  You lose consciousness. Summary  Pelvic pain is pain in your lower abdomen, below your belly button and between your hips.  There are many potential causes of pelvic pain.  Keep a journal of your pelvic pain. This information is not intended to replace advice given to you by your health care provider. Make sure you discuss any questions you have with your health care provider. Document Revised: 07/26/2017 Document Reviewed: 07/26/2017 Elsevier Patient Education  [place].  Sciatica  Sciatica is pain, numbness, weakness, or tingling along the path of the sciatic nerve. The sciatic nerve starts in the lower back and runs down the back of each leg. The nerve controls the muscles in the lower leg and in the back of the knee. It also provides feeling (sensation) to the back of the thigh, the lower leg, and the sole of the foot. Sciatica is a symptom of another medical  condition that pinches or puts pressure on the sciatic nerve. Sciatica most often only affects one side of the body. Sciatica usually goes away on its own or with treatment. In some cases, sciatica may come back (recur). What are the causes? This condition is caused by pressure on the sciatic nerve or pinching of the nerve. This may be the result of:  A disk in between the bones of the spine bulging out too far (herniated disk).  Age-related changes in the spinal disks.  A pain disorder that affects a muscle in the buttock.  Extra bone growth near the sciatic nerve.  A break (fracture) of the pelvis.  Pregnancy.  Tumor. This is rare. What increases the risk? The following factors may make you more  likely to develop this condition:  Playing sports that place pressure or stress on the spine.  Having poor strength and flexibility.  A history of back injury or surgery.  Sitting for long periods of time.  Doing activities that involve repetitive bending or lifting.  Obesity. What are the signs or symptoms? Symptoms can vary from mild to very severe, and they may include:  Any of these problems in the lower back, leg, hip, or buttock: ? Mild tingling, numbness, or dull aches. ? Burning sensations. ? Sharp pains.  Numbness in the back of the calf or the sole of the foot.  Leg weakness.  Severe back pain that makes movement difficult. Symptoms may get worse when you cough, sneeze, or laugh, or when you sit or stand for long periods of time. How is this diagnosed? This condition may be diagnosed based on:  Your symptoms and medical history.  A physical exam.  Blood tests.  Imaging tests, such as: ? X-rays. ? MRI. ? CT scan. How is this treated? In many cases, this condition improves on its own without treatment. However, treatment may include:  Reducing or modifying physical activity.  Exercising and stretching.  Icing and applying heat to the affected area.  Medicines that help to: ? Relieve pain and swelling. ? Relax your muscles.  Injections of medicines that help to relieve pain, irritation, and inflammation around the sciatic nerve (steroids).  Surgery. Follow these instructions at home: Medicines  Take over-the-counter and prescription medicines only as told by your health care provider.  Ask your health care provider if the medicine prescribed to you: ? Requires you to avoid driving or using heavy machinery. ? Can cause constipation. You may need to take these actions to prevent or treat constipation:  Drink enough fluid to keep your urine pale yellow.  Take over-the-counter or prescription medicines.  Eat foods that are high in fiber, such as  beans, whole grains, and fresh fruits and vegetables.  Limit foods that are high in fat and processed sugars, such as fried or sweet foods. Managing pain      If directed, put ice on the affected area. ? Put ice in a plastic bag. ? Place a towel between your skin and the bag. ? Leave the ice on for 20 minutes, 2-3 times a day.  If directed, apply heat to the affected area. Use the heat source that your health care provider recommends, such as a moist heat pack or a heating pad. ? Place a towel between your skin and the heat source. ? Leave the heat on for 20-30 minutes. ? Remove the heat if your skin turns bright red. This is especially important if you are unable to feel pain, heat,  or cold. You may have a greater risk of getting burned. Activity   Return to your normal activities as told by your health care provider. Ask your health care provider what activities are safe for you.  Avoid activities that make your symptoms worse.  Take brief periods of rest throughout the day. ? When you rest for longer periods, mix in some mild activity or stretching between periods of rest. This will help to prevent stiffness and pain. ? Avoid sitting for long periods of time without moving. Get up and move around at least one time each hour.  Exercise and stretch regularly, as told by your health care provider.  Do not lift anything that is heavier than 10 lb (4.5 kg) while you have symptoms of sciatica. When you do not have symptoms, you should still avoid heavy lifting, especially repetitive heavy lifting.  When you lift objects, always use proper lifting technique, which includes: ? Bending your knees. ? Keeping the load close to your body. ? Avoiding twisting. General instructions  Maintain a healthy weight. Excess weight puts extra stress on your back.  Wear supportive, comfortable shoes. Avoid wearing high heels.  Avoid sleeping on a mattress that is too soft or too hard. A mattress  that is firm enough to support your back when you sleep may help to reduce your pain.  Keep all follow-up visits as told by your health care provider. This is important. Contact a health care provider if:  You have pain that: ? Wakes you up when you are sleeping. ? Gets worse when you lie down. ? Is worse than you have experienced in the past. ? Lasts longer than 4 weeks.  You have an unexplained weight loss. Get help right away if:  You are not able to control when you urinate or have bowel movements (incontinence).  You have: ? Weakness in your lower back, pelvis, buttocks, or legs that gets worse. ? Redness or swelling of your back. ? A burning sensation when you urinate. Summary  Sciatica is pain, numbness, weakness, or tingling along the path of the sciatic nerve.  This condition is caused by pressure on the sciatic nerve or pinching of the nerve.  Sciatica can cause pain, numbness, or tingling in the lower back, legs, hips, and buttocks.  Treatment often includes rest, exercise, medicines, and applying ice or heat. This information is not intended to replace advice given to you by your health care provider. Make sure you discuss any questions you have with your health care provider. Document Revised: 02/26/2018 Document Reviewed: 02/26/2018 Elsevier Patient Education  [place].

## 2019-03-07 ENCOUNTER — Ambulatory Visit (INDEPENDENT_AMBULATORY_CARE_PROVIDER_SITE_OTHER): Payer: Managed Care, Other (non HMO)

## 2019-03-07 ENCOUNTER — Encounter: Payer: Self-pay | Admitting: Obstetrics and Gynecology

## 2019-03-07 DIAGNOSIS — R102 Pelvic and perineal pain: Secondary | ICD-10-CM | POA: Diagnosis not present

## 2019-03-07 DIAGNOSIS — Z8742 Personal history of other diseases of the female genital tract: Secondary | ICD-10-CM | POA: Diagnosis not present

## 2019-03-12 ENCOUNTER — Other Ambulatory Visit: Payer: Medicaid Other

## 2019-04-04 ENCOUNTER — Encounter: Payer: 59 | Admitting: Certified Nurse Midwife

## 2019-04-11 ENCOUNTER — Encounter: Payer: 59 | Admitting: Certified Nurse Midwife

## 2019-04-29 ENCOUNTER — Telehealth: Payer: Self-pay | Admitting: Certified Nurse Midwife

## 2019-04-29 ENCOUNTER — Encounter: Payer: Self-pay | Admitting: Certified Nurse Midwife

## 2019-04-29 ENCOUNTER — Ambulatory Visit (INDEPENDENT_AMBULATORY_CARE_PROVIDER_SITE_OTHER): Payer: Managed Care, Other (non HMO) | Admitting: Certified Nurse Midwife

## 2019-04-29 ENCOUNTER — Other Ambulatory Visit (HOSPITAL_COMMUNITY)
Admission: RE | Admit: 2019-04-29 | Discharge: 2019-04-29 | Disposition: A | Discharge status: Home / Self Care | Payer: Managed Care, Other (non HMO) | Source: Ambulatory Visit | Attending: Certified Nurse Midwife | Admitting: Certified Nurse Midwife

## 2019-04-29 ENCOUNTER — Other Ambulatory Visit: Payer: Self-pay

## 2019-04-29 VITALS — BP 105/70 | HR 93 | Ht 65.0 in | Wt 204.6 lb

## 2019-04-29 DIAGNOSIS — R102 Pelvic and perineal pain: Secondary | ICD-10-CM | POA: Insufficient documentation

## 2019-04-29 DIAGNOSIS — Z8742 Personal history of other diseases of the female genital tract: Secondary | ICD-10-CM | POA: Diagnosis present

## 2019-04-29 DIAGNOSIS — N912 Amenorrhea, unspecified: Secondary | ICD-10-CM | POA: Diagnosis present

## 2019-04-29 LAB — POCT URINE PREGNANCY: Preg Test, Ur: NEGATIVE

## 2019-04-29 MED ORDER — HYDROCODONE-ACETAMINOPHEN 5-325 MG PO TABS: 1.0000 | ORAL_TABLET | Freq: Four times a day (QID) | ORAL | 0 refills | Status: DC | PRN

## 2019-04-29 NOTE — Progress Notes (Signed)
Patient c/o constant lower left pelvic pain "for months", last night was severe, took HPT 1.5 weeks ago with negative result.  Patient was on Edward Qualia but ran out of samples.  Patient taking Tylenol or Ibuprofen with relief.

## 2019-04-29 NOTE — Telephone Encounter (Signed)
Pt called in and stated that she is having pain in her left ovary. The pt was told by the nurse on call that she needs to be seen. Can we add her on for the 4:00 today? Please let me know
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
## 2019-04-29 NOTE — Telephone Encounter (Signed)
Patient may be added at 2:15 pm today.  Please advise patient that we do not have an ultrasound tech today so she will only see Sharyn Lull.  Thanks. Jennye Moccasin

## 2019-04-29 NOTE — Patient Instructions (Addendum)
Pelvic Pain, Female Pelvic pain is pain in your lower belly (abdomen), below your belly button and between your hips. The pain may start suddenly (be acute), keep coming back (be recurring), or last a long time (become chronic). Pelvic pain that lasts longer than 6 months is called chronic pelvic pain. There are many causes of pelvic pain. Sometimes the cause of pelvic pain is not known. Follow these instructions at home:   Take over-the-counter and prescription medicines only as told by your doctor.  Rest as told by your doctor.  Do not have sex if it hurts.  Keep a journal of your pelvic pain. Write down: ? When the pain started. ? Where the pain is located. ? What seems to make the pain better or worse, such as food or your period (menstrual cycle). ? Any symptoms you have along with the pain.  Keep all follow-up visits as told by your doctor. This is important. Contact a doctor if:  Medicine does not help your pain.  Your pain comes back.  You have new symptoms.  You have unusual discharge or bleeding from your vagina.  You have a fever or chills.  You are having trouble pooping (constipation).  You have blood in your pee (urine) or poop (stool).  Your pee smells bad.  You feel weak or light-headed. Get help right away if:  You have sudden pain that is very bad.  Your pain keeps getting worse.  You have very bad pain and also have any of these symptoms: ? A fever. ? Feeling sick to your stomach (nausea). ? Throwing up (vomiting). ? Being very sweaty.  You pass out (lose consciousness). Summary  Pelvic pain is pain in your lower belly (abdomen), below your belly button and between your hips.  There are many possible causes of pelvic pain.  Keep a journal of your pelvic pain. This information is not intended to replace advice given to you by your health care provider. Make sure you discuss any questions you have with your health care provider. Document  Revised: 07/26/2017 Document Reviewed: 07/26/2017 Elsevier Patient Education  [place]. Acetaminophen; Hydrocodone tablets or capsules What is this medicine? ACETAMINOPHEN; HYDROCODONE (a set a MEE noe fen; hye droe KOE done) is a pain reliever. It is used to treat moderate to severe pain. This medicine may be used for other purposes; ask your health care provider or pharmacist if you have questions. COMMON BRAND NAME(S): Anexsia, Bancap HC, Ceta-Plus, Co-Gesic, Comfortpak, Dolagesic, [facility] [facility], DuoCet, Hydrocet, Hydrogesic, [place], Lorcet HD, Lorcet Plus, Lortab, Margesic H, Maxidone, [place], Polygesic, [place], [place], [place], Vicodin, Vicodin ES, Vicodin HP, Charlane Ferretti What should I tell my health care provider before I take this medicine? They need to know if you have any of these conditions:  brain tumor  Crohn's disease, inflammatory bowel disease, or ulcerative colitis  drug abuse or addiction  head injury  heart or circulation problems  if you often drink alcohol  kidney disease or problems going to the bathroom  liver disease  lung disease, asthma, or breathing problems  an unusual or allergic reaction to acetaminophen, hydrocodone, other opioid analgesics, other medicines, foods, dyes, or preservatives  pregnant or trying to get pregnant  breast-feeding How should I use this medicine? Take this medicine by mouth with a glass of water. Follow the directions on the prescription label. You can take it with or without food. If it upsets your stomach, take it with food. Do not take your medicine  more often than directed. A special MedGuide will be given to you by the pharmacist with each prescription and refill. Be sure to read this information carefully each time. Talk to your pediatrician regarding the use of this medicine in children. Special care may be needed. Overdosage: If you think you have taken too much of this medicine contact a poison control  center or emergency room at once. NOTE: This medicine is only for you. Do not share this medicine with others. What if I miss a dose? If you miss a dose, take it as soon as you can. If it is almost time for your next dose, take only that dose. Do not take double or extra doses. What may interact with this medicine? This medicine may interact with the following medications:  alcohol  antiviral medicines for HIV or AIDS  atropine  antihistamines for allergy, cough and cold  certain antibiotics like erythromycin, clarithromycin  certain medicines for anxiety or sleep  certain medicines for bladder problems like oxybutynin, tolterodine  certain medicines for depression like amitriptyline, fluoxetine, sertraline  certain medicines for fungal infections like ketoconazole and itraconazole  certain medicines for Parkinson's disease like benztropine, trihexyphenidyl  certain medicines for seizures like carbamazepine, phenobarbital, phenytoin, primidone  certain medicines for stomach problems like dicyclomine, hyoscyamine  certain medicines for travel sickness like scopolamine  general anesthetics like halothane, isoflurane, methoxyflurane, propofol  ipratropium  local anesthetics like lidocaine, pramoxine, tetracaine  MAOIs like Carbex, Eldepryl, Marplan, Nardil, and Parnate  medicines that relax muscles for surgery  other medicines with acetaminophen  other narcotic medicines for pain or cough  phenothiazines like chlorpromazine, mesoridazine, prochlorperazine, thioridazine  rifampin This list may not describe all possible interactions. Give your health care provider a list of all the medicines, herbs, non-prescription drugs, or dietary supplements you use. Also tell them if you smoke, drink alcohol, or use illegal drugs. Some items may interact with your medicine. What should I watch for while using this medicine? Tell your health care provider if your pain does not go  away, if it gets worse, or if you have new or a different type of pain. You may develop tolerance to this drug. Tolerance means that you will need a higher dose of the drug for pain relief. Tolerance is normal and is expected if you take this drug for a long time. There are different types of narcotic drugs (opioids) for pain. If you take more than one type at the same time, you may have more side effects. Give your health care provider a list of all drugs you use. He or she will tell you how much drug to take. Do not take more drug than directed. Get emergency help right away if you have problems breathing. Do not suddenly stop taking your drug because you may develop a severe reaction. Your body becomes used to the drug. This does NOT mean you are addicted. Addiction is a behavior related to getting and using a drug for a nonmedical reason. If you have pain, you have a medical reason to take pain drug. Your health care provider will tell you how much drug to take. If your health care provider wants you to stop the drug, the dose will be slowly lowered over time to avoid any side effects. Talk to your health care provider about naloxone and how to get it. Naloxone is an emergency drug used for an opioid overdose. An overdose can happen if you take too much opioid. It can also  happen if an opioid is taken with some other drugs or substances, like alcohol. Know the symptoms of an overdose, like trouble breathing, unusually tired or sleepy, or not being able to respond or wake up. Make sure to tell caregivers and close contacts where it is stored. Make sure they know how to use it. After naloxone is given, you must get emergency help right away. Naloxone is a temporary treatment. Repeat doses may be needed. Do not take other drugs that contain acetaminophen with this drug. Many non-prescription drugs contain acetaminophen. Always read labels carefully. If you have questions, ask your health care provider. If you  take too much acetaminophen, get medical help right away. Too much acetaminophen can be very dangerous and cause liver damage. Even if you do not have symptoms, it is important to get help right away. You may get drowsy or dizzy. Do not drive, use machinery, or do anything that needs mental alertness until you know how this drug affects you. Do not stand up or sit up quickly, especially if you are an older patient. This reduces the risk of dizzy or fainting spells. Alcohol may interfere with the effect of this drug. Avoid alcoholic drinks. This drug will cause constipation. If you do not have a bowel movement for 3 days, call your health care provider. Your mouth may get dry. Chewing sugarless gum or sucking hard candy and drinking plenty of water may help. Contact your health care provider if the problem does not go away or is severe. What side effects may I notice from receiving this medicine? Side effects that you should report to your doctor or health care professional as soon as possible:  allergic reactions like skin rash, itching or hives, swelling of the face, lips, or tongue  breathing problems  confusion  redness, blistering, peeling or loosening of the skin, including inside the mouth  signs and symptoms of low blood pressure like dizziness; feeling faint or lightheaded, falls; unusually weak or tired  trouble passing urine or change in the amount of urine  yellowing of the eyes or skin Side effects that usually do not require medical attention (report to your doctor or health care professional if they continue or are bothersome):  constipation  dry mouth  nausea, vomiting  tiredness This list may not describe all possible side effects. Call your doctor for medical advice about side effects. You may report side effects to FDA at 1-800-FDA-1088. Where should I keep my medicine? Keep out of the reach of children. This medicine can be abused. Keep your medicine in a safe place  to protect it from theft. Do not share this medicine with anyone. Selling or giving away this medicine is dangerous and against the law. Store at room temperature between 15 and 30 degrees C (59 and 86 degrees F). This medicine may cause harm and death if it is taken by other adults, children, or pets. Return medicine that has not been used to an official disposal site. Contact the DEA at 352-160-8809 or your city/county government to find a site. If you cannot return the medicine, flush it down the toilet. Do not use the medicine after the expiration date. NOTE: This sheet is a summary. It may not cover all possible information. If you have questions about this medicine, talk to your doctor, pharmacist, or health care provider.  2020 Elsevier/Gold Standard (2018-09-17 12:05:41)

## 2019-04-30 ENCOUNTER — Other Ambulatory Visit: Payer: Medicaid Other

## 2019-04-30 ENCOUNTER — Ambulatory Visit (INDEPENDENT_AMBULATORY_CARE_PROVIDER_SITE_OTHER): Payer: Managed Care, Other (non HMO)

## 2019-04-30 DIAGNOSIS — Z8742 Personal history of other diseases of the female genital tract: Secondary | ICD-10-CM | POA: Diagnosis not present

## 2019-04-30 DIAGNOSIS — R102 Pelvic and perineal pain: Secondary | ICD-10-CM | POA: Diagnosis not present

## 2019-05-01 LAB — CERVICOVAGINAL ANCILLARY ONLY
Bacterial Vaginitis (gardnerella): NEGATIVE
Candida Glabrata: NEGATIVE
Candida Vaginitis: NEGATIVE
Chlamydia: NEGATIVE
Comment: NEGATIVE
Comment: NEGATIVE
Comment: NEGATIVE
Comment: NEGATIVE
Comment: NEGATIVE
Comment: NORMAL
Neisseria Gonorrhea: NEGATIVE
Trichomonas: NEGATIVE

## 2019-06-14 ENCOUNTER — Ambulatory Visit (INDEPENDENT_AMBULATORY_CARE_PROVIDER_SITE_OTHER): Payer: Managed Care, Other (non HMO) | Admitting: Certified Nurse Midwife

## 2019-06-14 ENCOUNTER — Encounter: Payer: Self-pay | Admitting: Certified Nurse Midwife

## 2019-06-14 ENCOUNTER — Other Ambulatory Visit: Payer: Self-pay

## 2019-06-14 VITALS — BP 110/78 | HR 97 | Ht 65.0 in | Wt 196.4 lb

## 2019-06-14 DIAGNOSIS — Z01419 Encounter for gynecological examination (general) (routine) without abnormal findings: Secondary | ICD-10-CM | POA: Diagnosis not present

## 2019-06-14 DIAGNOSIS — R102 Pelvic and perineal pain: Secondary | ICD-10-CM | POA: Diagnosis not present

## 2019-06-14 DIAGNOSIS — Z8742 Personal history of other diseases of the female genital tract: Secondary | ICD-10-CM | POA: Diagnosis not present

## 2019-06-14 MED ORDER — DANAZOL 200 MG PO CAPS: 200.0000 mg | ORAL_CAPSULE | Freq: Two times a day (BID) | ORAL | 2 refills | Status: DC
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
## 2019-06-14 NOTE — Patient Instructions (Signed)
Pelvic Pain, Female Pelvic pain is pain in your lower belly (abdomen), below your belly button and between your hips. The pain may start suddenly (be acute), keep coming back (be recurring), or last a long time (become chronic). Pelvic pain that lasts longer than 6 months is called chronic pelvic pain. There are many causes of pelvic pain. Sometimes the cause of pelvic pain is not known. Follow these instructions at home:   Take over-the-counter and prescription medicines only as told by your doctor.  Rest as told by your doctor.  Do not have sex if it hurts.  Keep a journal of your pelvic pain. Write down: ? When the pain started. ? Where the pain is located. ? What seems to make the pain better or worse, such as food or your period (menstrual cycle). ? Any symptoms you have along with the pain.  Keep all follow-up visits as told by your doctor. This is important. Contact a doctor if:  Medicine does not help your pain.  Your pain comes back.  You have new symptoms.  You have unusual discharge or bleeding from your vagina.  You have a fever or chills.  You are having trouble pooping (constipation).  You have blood in your pee (urine) or poop (stool).  Your pee smells bad.  You feel weak or light-headed. Get help right away if:  You have sudden pain that is very bad.  Your pain keeps getting worse.  You have very bad pain and also have any of these symptoms: ? A fever. ? Feeling sick to your stomach (nausea). ? Throwing up (vomiting). ? Being very sweaty.  You pass out (lose consciousness). Summary  Pelvic pain is pain in your lower belly (abdomen), below your belly button and between your hips.  There are many possible causes of pelvic pain.  Keep a journal of your pelvic pain. This information is not intended to replace advice given to you by your health care provider. Make sure you discuss any questions you have with your health care provider. Document  Revised: 07/26/2017 Document Reviewed: 07/26/2017 Elsevier Patient Education  [place].   Ovarian Cyst An ovarian cyst is a fluid-filled sac on an ovary. The ovaries are organs that make eggs in women. Most ovarian cysts go away on their own and are not cancerous (are benign). Some cysts need treatment. Follow these instructions at home:  Take over-the-counter and prescription medicines only as told by your doctor.  Do not drive or use heavy machinery while taking prescription pain medicine.  Get pelvic exams and Pap tests as often as told by your doctor.  Return to your normal activities as told by your doctor. Ask your doctor what activities are safe for you.  Do not use any products that contain nicotine or tobacco, such as cigarettes and e-cigarettes. If you need help quitting, ask your doctor.  Keep all follow-up visits as told by your doctor. This is important. Contact a doctor if:  Your periods are: ? Late. ? Irregular. ? Painful.   Your periods stop.  You have pelvic pain that does not go away.  You have pressure on your bladder.  You have trouble making your bladder empty when you pee (urinate).  You have pain during sex.  You have any of the following in your belly (abdomen): ? A feeling of fullness. ? Pressure. ? Discomfort. ? Pain that does not go away. ? Swelling.  You feel sick most of the time.  You  have trouble pooping (have constipation).  You are not as hungry as usual (you lose your appetite).  You get very bad acne.  You start to have more hair on your body and face.  You are gaining weight or losing weight without changing your exercise and eating habits.  You think you may be pregnant. Get help right away if:  You have belly pain that is very bad or gets worse.  You cannot eat or drink without throwing up (vomiting).  You suddenly get a fever.  Your period is a lot heavier than usual. This information is not intended to  replace advice given to you by your health care provider. Make sure you discuss any questions you have with your health care provider. Document Revised: 01/20/2017 Document Reviewed: 07/12/2015 Elsevier Patient Education  [place].   Danazol capsules What is this medicine? DANAZOL (DA na zole) is used in women to treat endometriosis and the symptoms of fibrocystic breast disease. This medicine may also be used in men and women to prevent serious allergic reactions known as angioedema. This medicine may be used for other purposes; ask your health care provider or pharmacist if you have questions. COMMON BRAND NAME(S): Danocrine What should I tell my health care provider before I take this medicine? They need to know if you have any of these conditions:  breast cancer  heart disease  kidney disease  liver disease  porphyria  unusual vaginal bleeding  an unusual or allergic reaction to danazol, other medicines, foods, dyes, or preservatives  pregnant or trying to get pregnant  breast-feeding How should I use this medicine? Take this medicine by mouth with a glass of water. Follow the directions on the prescription label. Take this medicine with food to decrease stomach upset. Take your doses at regular intervals. Do not take your medicine more often than directed. Talk to your pediatrician regarding the use of this medicine in children. Special care may be needed. Overdosage: If you think you have taken too much of this medicine contact a poison control center or emergency room at once. NOTE: This medicine is only for you. Do not share this medicine with others. What if I miss a dose? If you miss a dose, take it as soon as you can. If it is almost time for your next dose, take only that dose. Do not take double or extra doses. What may interact with this medicine? Do not take this medicine with any of the following medications:  cisapride  pimozide  ranolazine This  medicine may also interact with the following medications:  carbamazepine  medicines that treat or prevent blood clots like warfarin This list may not describe all possible interactions. Give your health care provider a list of all the medicines, herbs, non-prescription drugs, or dietary supplements you use. Also tell them if you smoke, drink alcohol, or use illegal drugs. Some items may interact with your medicine. What should I watch for while using this medicine? Check with your doctor or health care professional if you are a female patient and notice any changes in your voice, decrease in breast size, or if hair starts growing on your face. This medicine should not be used in pregnancy. You should use a non-hormonal form of birth control while on this medicine. If you become pregnant or think you may be pregnant while you are taking this medicine, you should stop taking this medicine and contact your doctor or health care professional. This medicine may cause  risk to a female fetus. This medicine can affect your menstrual cycle and you may stop having menstrual periods. These will return to normal within 2 to 3 months after you stop taking this medicine. What side effects may I notice from receiving this medicine? Side effects that you should report to your doctor or health care professional as soon as possible:  allergic reactions like skin rash, itching or hives, swelling of the face, lips, or tongue  changes in vision  dark urine  decrease in breast size  hair loss or unusual hair growth  headache  irregular vaginal bleeding, spotting  nausea, vomiting, stomach pain  redness, blistering, peeling or loosening of the skin, including inside the mouth  unusual bleeding or bruising  unusual swelling of feet or ankles  unusually weak or tired  voice changes  weight gain  yellowing of the skin or eyes Side effects that usually do not require medical attention (report to your  doctor or health care professional if they continue or are bothersome):  acne, oily skin  hot flashes, sweating  mood changes  vaginal dryness or irritation This list may not describe all possible side effects. Call your doctor for medical advice about side effects. You may report side effects to FDA at 1-800-FDA-1088. Where should I keep my medicine? Keep out of the reach of children. Store at room temperature between 15 and 30 degrees C (59 and 86 degrees F). Throw away any unused medicine after the expiration date. NOTE: This sheet is a summary. It may not cover all possible information. If you have questions about this medicine, talk to your doctor, pharmacist, or health care provider.  2020 Elsevier/Gold Standard (2007-05-31 16:38:33)    Dysmenorrhea Dysmenorrhea means painful cramps during your period (menstrual period). You will have pain in your lower belly (abdomen). The pain is caused by the tightening (contracting) of the muscles of the womb (uterus). The pain may be mild or very bad. With this condition, you may:  Have a headache.  Feel sick to your stomach (nauseous).  Throw up (vomit).  Have lower back pain. Follow these instructions at home: Helping pain and cramping   Put heat on your lower back or belly when you have pain or cramps. Use the heat source that your doctor tells you to use. ? Place a towel between your skin and the heat. ? Leave the heat on for 20-30 minutes. ? Remove the heat if your skin turns bright red. This is especially important if you cannot feel pain, heat, or cold. ? Do not have a heating pad on during sleep.  Do aerobic exercises. These include walking, swimming, or biking. These may help with cramps.  Massage your lower back or belly. This may help lessen pain. General instructions  Take over-the-counter and prescription medicines only as told by your doctor.  Do not drive or use heavy machinery while taking prescription pain  medicine.  Avoid alcohol and caffeine during and right before your period. These can make cramps worse.  Do not use any products that have nicotine or tobacco. These include cigarettes and e-cigarettes. If you need help quitting, ask your doctor.  Keep all follow-up visits as told by your doctor. This is important. Contact a doctor if:  You have pain that gets worse.  You have pain that does not get better with medicine.  You have pain during sex.  You feel sick to your stomach or you throw up during your period, and medicine does  not help. Get help right away if:  You pass out (faint). Summary  Dysmenorrhea means painful cramps during your period (menstrual period).  Put heat on your lower back or belly when you have pain or cramps.  Do exercises like walking, swimming, or biking to help with cramps.  Contact a doctor if you have pain during sex. This information is not intended to replace advice given to you by your health care provider. Make sure you discuss any questions you have with your health care provider. Document Revised: 01/20/2017 Document Reviewed: 02/25/2016 Elsevier Patient Education  [place].

## 2019-06-30 ENCOUNTER — Encounter: Payer: Self-pay | Admitting: Emergency Medicine

## 2019-06-30 ENCOUNTER — Emergency Department: Admission: EM | Admit: 2019-06-30 | Payer: Managed Care, Other (non HMO)

## 2019-06-30 ENCOUNTER — Inpatient Hospital Stay: Admit: 2019-06-30 | Payer: Self-pay | Source: Ambulatory Visit | Admitting: Psychiatry

## 2019-06-30 ENCOUNTER — Emergency Department (EMERGENCY_DEPARTMENT_HOSPITAL)
Admission: EM | Admit: 2019-06-30 | Discharge: 2019-07-01 | Disposition: A | Discharge status: Other Acute Inpatient Hospital | Payer: 59 | Source: Home / Self Care | Attending: Emergency Medicine | Admitting: Emergency Medicine

## 2019-06-30 ENCOUNTER — Other Ambulatory Visit: Payer: Self-pay

## 2019-06-30 DIAGNOSIS — R45851 Suicidal ideations: Secondary | ICD-10-CM | POA: Insufficient documentation

## 2019-06-30 DIAGNOSIS — F322 Major depressive disorder, single episode, severe without psychotic features: Secondary | ICD-10-CM

## 2019-06-30 DIAGNOSIS — Z79899 Other long term (current) drug therapy: Secondary | ICD-10-CM | POA: Insufficient documentation

## 2019-06-30 DIAGNOSIS — F329 Major depressive disorder, single episode, unspecified: Secondary | ICD-10-CM

## 2019-06-30 DIAGNOSIS — Z20822 Contact with and (suspected) exposure to covid-19: Secondary | ICD-10-CM | POA: Insufficient documentation

## 2019-06-30 DIAGNOSIS — Z9049 Acquired absence of other specified parts of digestive tract: Secondary | ICD-10-CM | POA: Insufficient documentation

## 2019-06-30 LAB — CBC
HCT: 45 % (ref 36.0–46.0)
Hemoglobin: 15.3 g/dL — ABNORMAL HIGH (ref 12.0–15.0)
MCH: 32.1 pg (ref 26.0–34.0)
MCHC: 34 g/dL (ref 30.0–36.0)
MCV: 94.3 fL (ref 80.0–100.0)
Platelets: 312 10*3/uL (ref 150–400)
RBC: 4.77 MIL/uL (ref 3.87–5.11)
RDW: 12 % (ref 11.5–15.5)
WBC: 7.9 10*3/uL (ref 4.0–10.5)
nRBC: 0 % (ref 0.0–0.2)

## 2019-06-30 LAB — SALICYLATE LEVEL: Salicylate Lvl: <7 mg/dL — ABNORMAL LOW (ref 7.0–30.0)

## 2019-06-30 LAB — COMPREHENSIVE METABOLIC PANEL
ALT: 28 U/L (ref 0–44)
AST: 18 U/L (ref 15–41)
Albumin: 4.4 g/dL (ref 3.5–5.0)
Alkaline Phosphatase: 72 U/L (ref 38–126)
Anion gap: 11 (ref 5–15)
BUN: 10 mg/dL (ref 6–20)
CO2: 21 mmol/L — ABNORMAL LOW (ref 22–32)
Calcium: 9.3 mg/dL (ref 8.9–10.3)
Chloride: 108 mmol/L (ref 98–111)
Creatinine, Ser: 0.67 mg/dL (ref 0.44–1.00)
GFR calc Af Amer: >60 mL/min (ref >60)
GFR calc non Af Amer: >60 mL/min (ref >60)
Glucose, Bld: 90 mg/dL (ref 70–99)
Potassium: 3.9 mmol/L (ref 3.5–5.1)
Sodium: 140 mmol/L (ref 135–145)
Total Bilirubin: 0.9 mg/dL (ref 0.3–1.2)
Total Protein: 7.5 g/dL (ref 6.5–8.1)

## 2019-06-30 LAB — RESPIRATORY PANEL BY RT PCR (FLU A&B, COVID)
Influenza A by PCR: NEGATIVE
Influenza B by PCR: NEGATIVE
SARS Coronavirus 2 by RT PCR: NEGATIVE

## 2019-06-30 LAB — ETHANOL: Alcohol, Ethyl (B): <10 mg/dL (ref <10)

## 2019-06-30 LAB — ACETAMINOPHEN LEVEL: Acetaminophen (Tylenol), Serum: <10 ug/mL — ABNORMAL LOW (ref 10–30)

## 2019-06-30 MED ORDER — DIPHENHYDRAMINE HCL 25 MG PO CAPS
50.0000 mg | ORAL_CAPSULE | Freq: Once | ORAL | Status: AC
  Administered 2019-06-30: 50 mg via ORAL
  Filled 2019-06-30: qty 2

## 2019-06-30 MED ORDER — IBUPROFEN 600 MG PO TABS
600.0000 mg | ORAL_TABLET | Freq: Once | ORAL | Status: AC
  Administered 2019-06-30: 19:00:00 600 mg via ORAL
  Filled 2019-06-30: qty 1

## 2019-06-30 NOTE — ED Notes (Signed)
Patient has been accepted to [facility].  Patient assigned to room 301 Accepting physician is Anette Riedel, NP.  Call report to [phone].  Representative was [place].   ER Staff is aware of it:  [facility] ER Secretary  Dr. Kerman Passey, ER MD  [facility] Guiles Patient's Nurse

## 2019-06-30 NOTE — ED Triage Notes (Signed)
Pt to ED via POV c/o SI. Pt states that she has been having SI for the past few weeks. Pt states that her plan is to OD on sleep medication. Pt does have sleeping medications at home. Pt states that she has hx/o anxiety and that over the last 6 months it has been worse than normal. Pt denies any events that triggered her anxiety to be worse. Pt is tearful in triage but is calm and cooperative.

## 2019-06-30 NOTE — ED Notes (Signed)
Patient dressed out by this RN and Linus Orn, EDT in hospital provided scrubs. Pt belongings placed in belongings bag and labeled with pt label and green tag. Pt had with them the following belongings:   1 pair of white flip flops 1 pair of black legging Capri pants  1 pair of pink and teal tie-dyed shirt  1 pair of black underwear 1 gray sports bra 1 hair bow 1 cell phone   Pt belongings taken with patient to quad. Pt calm and cooperative.

## 2019-06-30 NOTE — ED Notes (Signed)
Pt informed this RN that she did have sleeping medication at home but that she threw it away last night.

## 2019-06-30 NOTE — ED Notes (Signed)
Pt given urine cup but is unable to give sample at this time. Pt informed to collect sample when able
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
## 2019-06-30 NOTE — ED Notes (Signed)
Pt c/o cramping due to her menstrual cycle. Pt requesting ibuprofen. Dr. Kerman Passey, MD approved.

## 2019-06-30 NOTE — ED Notes (Signed)
Patient is to be admitted to [facility] on 07/01/2019
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
## 2019-06-30 NOTE — ED Notes (Signed)
Pt escorted to the interview room.
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
## 2019-07-01 ENCOUNTER — Inpatient Hospital Stay: Payer: 59

## 2019-07-01 ENCOUNTER — Encounter: Payer: Self-pay | Admitting: Psychiatric/Mental Health

## 2019-07-01 ENCOUNTER — Other Ambulatory Visit: Payer: Self-pay

## 2019-07-01 ENCOUNTER — Inpatient Hospital Stay
Admission: AD | Admit: 2019-07-01 | Discharge: 2019-07-02 | DRG: 881 | Disposition: A | Discharge status: Home / Self Care | Payer: 59 | Source: Intra-hospital | Attending: Psychiatry | Admitting: Psychiatry

## 2019-07-01 DIAGNOSIS — R102 Pelvic and perineal pain: Secondary | ICD-10-CM | POA: Diagnosis not present

## 2019-07-01 DIAGNOSIS — F419 Anxiety disorder, unspecified: Secondary | ICD-10-CM | POA: Diagnosis present

## 2019-07-01 DIAGNOSIS — R45851 Suicidal ideations: Secondary | ICD-10-CM | POA: Diagnosis present

## 2019-07-01 DIAGNOSIS — Z818 Family history of other mental and behavioral disorders: Secondary | ICD-10-CM

## 2019-07-01 DIAGNOSIS — F322 Major depressive disorder, single episode, severe without psychotic features: Secondary | ICD-10-CM | POA: Diagnosis not present

## 2019-07-01 DIAGNOSIS — Z20822 Contact with and (suspected) exposure to covid-19: Secondary | ICD-10-CM | POA: Diagnosis present

## 2019-07-01 DIAGNOSIS — F411 Generalized anxiety disorder: Secondary | ICD-10-CM | POA: Diagnosis not present

## 2019-07-01 DIAGNOSIS — F329 Major depressive disorder, single episode, unspecified: Secondary | ICD-10-CM | POA: Diagnosis present

## 2019-07-01 LAB — TSH: TSH: 1.429 u[IU]/mL (ref 0.350–4.500)

## 2019-07-01 MED ORDER — PANTOPRAZOLE SODIUM 40 MG PO TBEC
40.0000 mg | DELAYED_RELEASE_TABLET | Freq: Every day | ORAL | Status: DC
  Filled 2019-07-01: qty 1

## 2019-07-01 MED ORDER — TRAMADOL HCL 50 MG PO TABS
50.0000 mg | ORAL_TABLET | Freq: Four times a day (QID) | ORAL | Status: DC | PRN
  Administered 2019-07-01 – 2019-07-02 (×2): 50 mg via ORAL
  Filled 2019-07-01 (×2): qty 1

## 2019-07-01 MED ORDER — ALUM & MAG HYDROXIDE-SIMETH 200-200-20 MG/5ML PO SUSP: 30.0000 mL | ORAL | Status: DC | PRN

## 2019-07-01 MED ORDER — IOHEXOL 9 MG/ML PO SOLN: 500.0000 mL | Freq: Once | ORAL | Status: DC | PRN

## 2019-07-01 MED ORDER — ACETAMINOPHEN 325 MG PO TABS
650.0000 mg | ORAL_TABLET | Freq: Four times a day (QID) | ORAL | Status: DC | PRN
  Administered 2019-07-02: 650 mg via ORAL
  Filled 2019-07-01: qty 2

## 2019-07-01 MED ORDER — GABAPENTIN 100 MG PO CAPS
100.0000 mg | ORAL_CAPSULE | Freq: Three times a day (TID) | ORAL | Status: DC
  Administered 2019-07-01 – 2019-07-02 (×2): 100 mg via ORAL
  Filled 2019-07-01 (×2): qty 1

## 2019-07-01 MED ORDER — HYDROXYZINE HCL 25 MG PO TABS: 25.0000 mg | ORAL_TABLET | Freq: Three times a day (TID) | ORAL | Status: DC | PRN

## 2019-07-01 MED ORDER — DULOXETINE HCL 20 MG PO CPEP
20.0000 mg | ORAL_CAPSULE | Freq: Every day | ORAL | Status: DC
  Administered 2019-07-01 – 2019-07-02 (×2): 20 mg via ORAL
  Filled 2019-07-01 (×3): qty 1

## 2019-07-01 MED ORDER — DANAZOL 200 MG PO CAPS
200.0000 mg | ORAL_CAPSULE | Freq: Two times a day (BID) | ORAL | Status: DC
  Administered 2019-07-01 – 2019-07-02 (×2): 200 mg via ORAL
  Filled 2019-07-01 (×4): qty 1

## 2019-07-01 MED ORDER — HYDROXYZINE HCL 10 MG PO TABS: 10.0000 mg | ORAL_TABLET | Freq: Three times a day (TID) | ORAL | Status: DC | PRN

## 2019-07-01 MED ORDER — TRAZODONE HCL 50 MG PO TABS
50.0000 mg | ORAL_TABLET | Freq: Every evening | ORAL | Status: DC | PRN
  Administered 2019-07-01: 50 mg via ORAL
  Filled 2019-07-01: qty 1

## 2019-07-01 MED ORDER — IBUPROFEN 600 MG PO TABS: 600.0000 mg | ORAL_TABLET | Freq: Four times a day (QID) | ORAL | Status: DC | PRN

## 2019-07-01 MED ORDER — MAGNESIUM HYDROXIDE 400 MG/5ML PO SUSP: 30.0000 mL | Freq: Every day | ORAL | Status: DC | PRN

## 2019-07-01 MED ORDER — IOHEXOL 300 MG/ML  SOLN
100.0000 mL | Freq: Once | INTRAMUSCULAR | Status: AC | PRN
  Administered 2019-07-01: 16:00:00 100 mL via INTRAVENOUS

## 2019-07-01 NOTE — BHH Group Notes (Signed)
LCSW Group Therapy Note   07/01/2019 3:15 PM  Type of Therapy and Topic:  Group Therapy:  Overcoming Obstacles   Participation Level:  Did Not Attend   Description of Group:    In this group patients will be encouraged to explore what they see as obstacles to their own wellness and recovery. They will be guided to discuss their thoughts, feelings, and behaviors related to these obstacles. The group will process together ways to cope with barriers, with attention given to specific choices patients can make. Each patient will be challenged to identify changes they are motivated to make in order to overcome their obstacles. This group will be process-oriented, with patients participating in exploration of their own experiences as well as giving and receiving support and challenge from other group members.   Therapeutic Goals: 1. Patient will identify personal and current obstacles as they relate to admission. 2. Patient will identify barriers that currently interfere with their wellness or overcoming obstacles.  3. Patient will identify feelings, thought process and behaviors related to these barriers. 4. Patient will identify two changes they are willing to make to overcome these obstacles:      Summary of Patient Progress X      Therapeutic Modalities:   Cognitive Behavioral Therapy Solution Focused Therapy Motivational Interviewing Relapse Prevention Therapy  Assunta Curtis, MSW, LCSW 07/01/2019 3:15 PM

## 2019-07-01 NOTE — ED Notes (Signed)
Pt denies SI/HI/AVH on assessment. Tearful, reports she is sad and anxious. Will not say why she is feeling sad.  Advised will be going in for inpatient admission and she verbalized understanding.

## 2019-07-01 NOTE — ED Notes (Signed)
Pt brought into ED BHU via sally port and wand with metal detector for safety by [place] Security officer. Patient oriented to unit/care area: Pt informed of unit policies and procedures.  Informed that, for their safety, care areas are designed for safety and monitored by security cameras at all times. Patient verbalizes understanding, and verbal contract for safety obtained.Pt shown to their room.  

## 2019-07-01 NOTE — Tx Team (Signed)
Initial Treatment Plan 07/01/2019 2:53 PM Sarah Haney [id number]    PATIENT STRESSORS: Financial difficulties Health problems Marital or family conflict   PATIENT STRENGTHS: Ability for insight Average or above average intelligence Communication skills Motivation for treatment/growth Supportive family/friends   PATIENT IDENTIFIED PROBLEMS: Anxiety  Depression  Unemployment                  DISCHARGE CRITERIA:  Ability to meet basic life and health needs Adequate post-discharge living arrangements Medical problems require only outpatient monitoring Verbal commitment to aftercare and medication compliance  PRELIMINARY DISCHARGE PLAN: Attend aftercare/continuing care group Return to previous living arrangement  PATIENT/FAMILY INVOLVEMENT: This treatment plan has been presented to and reviewed with the patient, Sarah Haney, and/or family member,  The patient and family have been given the opportunity to ask questions and make suggestions.  Merlene Morse, RN 07/01/2019, 2:53 PM

## 2019-07-01 NOTE — ED Notes (Signed)
Pt transferred to BMU Rm 311. Report given  to [place], Therapist, sports. Pt transported down by NT and Animal nutritionist in NAD.
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
## 2019-07-01 NOTE — ED Notes (Signed)
IVC/Pending Placement 

## 2019-07-01 NOTE — [facility] Suicide Risk Assessment (Signed)
[facility] Admission Suicide Risk Assessment   Nursing information obtained from:    Demographic factors:    Current Mental Status:    Loss Factors:    Historical Factors:    Risk Reduction Factors:     Total Time spent with patient: 45 minutes Principal Problem: <principal problem not specified> Diagnosis:  Active Problems:   MDD (major depressive disorder)  Subjective Data: Patient is seen and examined. Patient is a 24 year old female with a past medical history significant for an ovarian cyst, and continued left lower quadrant pain who presented to the [facility] on 06/30/2019 with suicidal ideation. The patient stated that she has been having pelvic pain for an extended period of time. She had been previously diagnosed with an ovarian cyst and apparently underwent a laparoscopic ovarian cystectomy on 12/24/2018. This was done laparoscopically. She was discharged on the same day. She stated that since then she continued to have pain. She has had several ultrasounds that have not revealed any cause of her pain. She continues to have painful menstrual cycles. She was recently given fluoxetine for depression. They have also placed her on danazol 2 assist with is suspected to be some cause of her pelvic pain as well. She stated that she is unable to sleep, she has become more anxious, and this led to suicidality. She also noted an inability to eat, and was having other somatic complaints. She was evaluated in the emergency room and the decision was made to admit her to the psychiatric hospital for evaluation. She stated that since starting the fluoxetine at 10 mg a day it had no benefit. She stated she feels as though things are actually worse. She is unable to state whether or not she has had any benefit from the danazol. She was admitted to the hospital for evaluation and stabilization.  Continued Clinical Symptoms:    The "Alcohol Use Disorders Identification Test", Guidelines for  Use in Primary Care, Second Edition.  World Pharmacologist [facility]). Score between 0-7:  no or low risk or alcohol related problems. Score between 8-15:  moderate risk of alcohol related problems. Score between 16-19:  high risk of alcohol related problems. Score 20 or above:  warrants further diagnostic evaluation for alcohol dependence and treatment.   CLINICAL FACTORS:   Severe Anxiety and/or Agitation Depression:   Anhedonia Hopelessness Impulsivity Insomnia Medical Diagnoses and Treatments/Surgeries   Musculoskeletal: Strength & Muscle Tone: within normal limits Gait & Station: normal Patient leans: N/A  Psychiatric Specialty Exam: Physical Exam  Nursing note and vitals reviewed. Constitutional: She is oriented to person, place, and time. She appears well-developed and well-nourished.  HENT:  Head: Normocephalic and atraumatic.  Respiratory: Effort normal.  Neurological: She is alert and oriented to person, place, and time.    Review of Systems  Blood pressure 124/88, pulse 96, temperature 97.6 F (36.4 C), temperature source Oral, resp. rate 18, height 5\' 7"  (1.702 m), weight 86.2 kg, last menstrual period 06/28/2019, SpO2 99 %, not currently breastfeeding.Body mass index is 29.76 kg/m.  General Appearance: Casual  Eye Contact:  Fair  Speech:  Normal Rate  Volume:  Decreased  Mood:  Anxious and Depressed  Affect:  Tearful  Thought Process:  Coherent and Descriptions of Associations: Intact  Orientation:  Full (Time, Place, and Person)  Thought Content:  Logical  Suicidal Thoughts:  No  Homicidal Thoughts:  No  Memory:  Immediate;   Fair Recent;   Fair Remote;   Fair  Judgement:  Intact  Insight:  Fair  Psychomotor Activity:  Decreased  Concentration:  Concentration: Fair and Attention Span: Fair  Recall:  [employer] of Knowledge:  Good  Language:  Fair  Akathisia:  Negative  Handed:  Right  AIMS (if indicated):     Assets:  Desire for  Improvement Housing Resilience Social Support  ADL's:  Intact  Cognition:  WNL  Sleep:         COGNITIVE FEATURES THAT CONTRIBUTE TO RISK:  None    SUICIDE RISK:   Mild:  Suicidal ideation of limited frequency, intensity, duration, and specificity.  There are no identifiable plans, no associated intent, mild dysphoria and related symptoms, good self-control (both objective and subjective assessment), few other risk factors, and identifiable protective factors, including available and accessible social support.  PLAN OF CARE: Patient is seen and examined. Patient is a 24 year old female with the above-stated past medical and psychiatric history was admitted with worsening depression and suicidal ideation. She will be admitted to the hospital. She will be integrated into the milieu. She will be encouraged to attend groups. She does not believe that the fluoxetine is of any benefit. Because of her chronic pain issues I will start her on Cymbalta 20 mg p.o. daily and this to be titrated during the course the hospitalization. Additionally I will start her on gabapentin 100 mg p.o. 3 times daily and this should be for pain as well as anxiety. We will see if that is of any benefit. She really has not had any psychiatric medications in the past, and so her admission dosage of hydroxyzine at 25 mg will be reduced to 10 mg. We will continue the trazodone for now. I reviewed her imaging studies, and she really has not had any definitive studies in her pelvis outside of ultrasounds. I think given the fact that she has had this pain and multiple ultrasounds without successful diagnosis she needs a CT scan of the pelvis. We will do this with and without contrast. She has had an MRI of her abdomen done, but again it did not visualize her pelvis very well. At least we can eliminate the idea of any particular pelvic process that we have not diagnosed so far. I will continue the danazol for now but the patient does  not believe that is been of great benefit, and clearly it would alter hormonal status. She is already asking to be discharged so she could be at home with her children. I have tried to alleviate some of her fears and we will attempt to get this done as rapidly as possible.  I certify that inpatient services furnished can reasonably be expected to improve the patient's condition.   Sharma Covert, MD 07/01/2019, 1:58 PM

## 2019-07-01 NOTE — ED Provider Notes (Signed)
Emergency Medicine Observation Re-evaluation Note  Sarah Haney is a 24 y.o. female, seen on rounds today.  Pt initially presented to the ED for complaints of Suicidal Currently, the patient is resting in no acute distress.  Physical Exam  BP 125/79 (BP Location: Right Arm)   Pulse 91   Temp 98.4 F (36.9 C) (Oral)   Resp 15   Ht 5\' 5"  (1.651 m)   Wt 88.5 kg   LMP 06/28/2019   SpO2 98%   BMI 32.45 kg/m  Physical Exam  ED Course / MDM  EKG:    I have reviewed the labs performed to date as well as medications administered while in observation.  Recent changes in the last 24 hours include no events overnight. Plan  Current plan is for psychiatric disposition. Patient is under full IVC at this time.   Paulette Blanch, MD 07/01/19 [phone]

## 2019-07-01 NOTE — Progress Notes (Signed)
Patient admitted from ED with Depression.Patient is sad and tearful  but cooperative during admission assessment.Patient stated that her main stress is she is not able to take care of her kids.Patient is separated and unemployed. Patient denies SI/HI at this time. Patient denies AVH. Patient informed of fall risk status, fall risk assessed "low" at this time. Patient oriented to unit/staff/room. Patient denies any questions/concerns at this time. Patient safe on unit with Q15 minute checks for safety.Skin assessment and body search done.No contraband found.

## 2019-07-02 MED ORDER — HYDROXYZINE HCL 10 MG PO TABS: 10.0000 mg | ORAL_TABLET | Freq: Three times a day (TID) | ORAL | 0 refills | Status: DC | PRN

## 2019-07-02 MED ORDER — GABAPENTIN 100 MG PO CAPS: 200.0000 mg | ORAL_CAPSULE | Freq: Three times a day (TID) | ORAL | 0 refills | Status: DC

## 2019-07-02 MED ORDER — DULOXETINE HCL 20 MG PO CPEP: 20.0000 mg | ORAL_CAPSULE | Freq: Every day | ORAL | 0 refills | Status: DC

## 2019-07-02 MED ORDER — GABAPENTIN 100 MG PO CAPS: 200.0000 mg | ORAL_CAPSULE | Freq: Three times a day (TID) | ORAL | Status: DC

## 2019-07-02 MED ORDER — TRAMADOL HCL 50 MG PO TABS: 50.0000 mg | ORAL_TABLET | Freq: Four times a day (QID) | ORAL | 0 refills | Status: AC | PRN

## 2019-07-02 NOTE — Progress Notes (Signed)
Care of patient taken over at 11pm, patient was awake in no distress. Patient did complain of abdominal pain, prn given with good relief, pt noted resting comfortably afterwards in no distress. Pt remains safe on unit with q 15 min checks.
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
## 2019-07-02 NOTE — Progress Notes (Signed)
  [facility] Adult Case Management Discharge Plan :  Will you be returning to the same living situation after discharge:  Yes,  lives alone At discharge, do you have transportation home?: Yes,  pt reports mother will pick up Do you have the ability to pay for your medications: Yes,  [employer], Medicaid  Release of information consent forms completed and in the chart;  Patient's signature needed at discharge.  Patient to Follow up at: Follow-up Information    [place] Follow up on 07/05/2019.   Why: You are scheduled for an in person appointment on Friday, may 14th at 230pm. Please bring photo ID, insurance card, hospital discharge paperwork. Thank you. Contact information: [place] [address] [phone]           Next level of care provider has access to [place] and Suicide Prevention discussed: Yes,  with pt; declined family contact  Have you used any form of tobacco in the last 30 days? (Cigarettes, Smokeless Tobacco, Cigars, and/or Pipes): No  Has patient been referred to the Quitline?: N/A patient is not a smoker  Patient has been referred for addiction treatment: N/A  Yvette Rack, LCSW 07/02/2019, 9:28 AM

## 2019-07-02 NOTE — [facility] Suicide Risk Assessment (Signed)
[facility] INPATIENT:  Family/Significant Other Suicide Prevention Education  Suicide Prevention Education:  Patient Refusal for Family/Significant Other Suicide Prevention Education: The patient Sarah Haney has refused to provide written consent for family/significant other to be provided Family/Significant Other Suicide Prevention Education during admission and/or prior to discharge.  Physician notified.  Jarelis Ehlert T Kerstin Crusoe 07/02/2019, 9:28 AM

## 2019-07-02 NOTE — Progress Notes (Signed)
Recreation Therapy Notes   Date: 07/02/2019  Time: 9:30 am   Location: Craft room    Behavioral response: N/A   Intervention Topic: Happiness   Discussion/Intervention: Patient did not attend group.   Clinical Observations/Feedback:  Patient did not attend group.   Mauria Asquith LRT/CTRS        Ranon Coven 07/02/2019 11:33 AM

## 2019-07-02 NOTE — [facility] Suicide Risk Assessment (Signed)
[facility] Discharge Suicide Risk Assessment   Principal Problem: <principal problem not specified> Discharge Diagnoses: Active Problems:   MDD (major depressive disorder)   Total Time spent with patient: 20 minutes  Musculoskeletal: Strength & Muscle Tone: within normal limits Gait & Station: normal Patient leans: N/A  Psychiatric Specialty Exam: Review of Systems  Genitourinary: Positive for pelvic pain.  All other systems reviewed and are negative.   Blood pressure (!) 137/96, pulse (!) 103, temperature 97.8 F (36.6 C), temperature source Oral, resp. rate 17, height 5\' 7"  (1.702 m), weight 86.2 kg, last menstrual period 06/28/2019, SpO2 100 %, not currently breastfeeding.Body mass index is 29.76 kg/m.  General Appearance: Casual  Eye Contact::  Fair  Speech:  Normal Rate409  Volume:  Normal  Mood:  Anxious  Affect:  Congruent  Thought Process:  Coherent and Descriptions of Associations: Intact  Orientation:  Full (Time, Place, and Person)  Thought Content:  Logical  Suicidal Thoughts:  No  Homicidal Thoughts:  No  Memory:  Immediate;   Good Recent;   Good Remote;   Good  Judgement:  Intact  Insight:  Fair  Psychomotor Activity:  Normal  Concentration:  Good  Recall:  Good  Fund of Knowledge:Good  Language: Good  Akathisia:  Negative  Handed:  Right  AIMS (if indicated):     Assets:  Communication Skills Desire for Improvement Housing Resilience Social Support Talents/Skills  Sleep:  Number of Hours: 5  Cognition: WNL  ADL's:  Intact   Mental Status Per Nursing Assessment::   On Admission:  NA  Demographic Factors:  Caucasian and Low socioeconomic status  Loss Factors: Decline in physical health  Historical Factors: Impulsivity  Risk Reduction Factors:   Responsible for children under 1 years of age, Sense of responsibility to family, Living with another person, especially a relative and Positive social support  Continued Clinical Symptoms:   Severe Anxiety and/or Agitation Depression:   Impulsivity  Cognitive Features That Contribute To Risk:  None    Suicide Risk:  Minimal: No identifiable suicidal ideation.  Patients presenting with no risk factors but with morbid ruminations; may be classified as minimal risk based on the severity of the depressive symptoms  Follow-up Information    [place] Follow up.   Contact information: [place] [address] [phone]           Plan Of Care/Follow-up recommendations:  Activity:  ad lib  Sharma Covert, MD 07/02/2019, 9:23 AM

## 2019-07-02 NOTE — [facility] Counselor (Signed)
Adult Comprehensive Assessment  Patient ID: Sarah Haney, female   DOB: Jan 31, 1996, 24 y.o.   MRN: [id number]  Information Source:    Current Stressors:     Living/Environment/Situation:  Living Arrangements: Parent  Family History:     Childhood History:     Education:     Employment/Work Situation:      Museum/gallery curator Resources:      Alcohol/Substance Abuse:      Social Support System:      Leisure/Recreation:      Strengths/Needs:      Discharge Plan:      Summary/Recommendations:   Summary and Recommendations (to be completed by the evaluator): Pt is scheduled for discharge today. Pt is a 24 yr old female with a diagnosis of major depressive disorder who came to the ED due to physical pain from cyst on an ovary. Pt denies having a mental health provider and request referral for outpatient treatment. Pt denies any SI/HI or AH/VH.  Sarah Haney. 07/02/2019

## 2019-07-02 NOTE — Progress Notes (Signed)
Discharge Note:   Pt discharged at 11:45am, left with her mother, her ride. Upon discharge pt is alert and oriented to person, place, time and situation. Pt is calm, cooperative, denies suicidal and homicidal ideation, denies feelings of depression and anxiety. Pt was given her discharge instructions which include outpatient follow up appointments, and discharge medication sent to pharmacy for pick up, given discharge medication education; pt verbalized understanding of all.  All personal belongings returned to pt upon discharge. Pt voiced no complaints, no distress noted or reported.

## 2019-07-02 NOTE — [facility] Group Notes (Signed)
Feelings Around Relapse 07/02/2019 1PM  Type of Therapy and Topic:  Group Therapy:  Feelings around Relapse and Recovery  Participation Level:  Did Not Attend   Description of Group:    Patients in this group will discuss emotions they experience before and after a relapse. They will process how experiencing these feelings, or avoidance of experiencing them, relates to having a relapse. Facilitator will guide patients to explore emotions they have related to recovery. Patients will be encouraged to process which emotions are more powerful. They will be guided to discuss the emotional reaction significant others in their lives may have to patients' relapse or recovery. Patients will be assisted in exploring ways to respond to the emotions of others without this contributing to a relapse.  Therapeutic Goals: 1. Patient will identify two or more emotions that lead to a relapse for them 2. Patient will identify two emotions that result when they relapse 3. Patient will identify two emotions related to recovery 4. Patient will demonstrate ability to communicate their needs through discussion and/or role plays   Summary of Patient Progress:     Therapeutic Modalities:   Cognitive Behavioral Therapy Solution-Focused Therapy Assertiveness Training Relapse Prevention Therapy   Yvette Rack, [place] 07/02/2019 2:32 PM

## 2019-07-24 ENCOUNTER — Other Ambulatory Visit (HOSPITAL_COMMUNITY): Payer: Self-pay | Admitting: Psychiatry

## 2019-08-16 ENCOUNTER — Other Ambulatory Visit: Payer: Self-pay | Admitting: Certified Nurse Midwife

## 2019-09-16 ENCOUNTER — Ambulatory Visit (INDEPENDENT_AMBULATORY_CARE_PROVIDER_SITE_OTHER): Payer: Managed Care, Other (non HMO) | Admitting: Certified Nurse Midwife

## 2019-09-16 ENCOUNTER — Encounter: Payer: Self-pay | Admitting: Certified Nurse Midwife

## 2019-09-16 VITALS — BP 117/83 | HR 91 | Ht 67.0 in | Wt 199.2 lb

## 2019-09-16 DIAGNOSIS — Z30019 Encounter for initial prescription of contraceptives, unspecified: Secondary | ICD-10-CM | POA: Diagnosis not present

## 2019-09-16 DIAGNOSIS — Z8742 Personal history of other diseases of the female genital tract: Secondary | ICD-10-CM

## 2019-09-16 MED ORDER — TRAMADOL HCL 50 MG PO TABS: 50.0000 mg | ORAL_TABLET | Freq: Four times a day (QID) | ORAL | 0 refills | Status: DC | PRN
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
## 2019-09-16 NOTE — Patient Instructions (Signed)
Ethinyl Estradiol; Norethindrone Acetate; Ferrous fumarate tablets or capsules What is this medicine? ETHINYL ESTRADIOL; NORETHINDRONE ACETATE; FERROUS FUMARATE (ETH in il es tra DYE ole; nor eth IN drone AS e tate; FER [place] FUE ma rate) is an oral contraceptive. The products combine two types of female hormones, an estrogen and a progestin. They are used to prevent ovulation and pregnancy. Some products are also used to treat acne in females. This medicine may be used for other purposes; ask your health care provider or pharmacist if you have questions. COMMON BRAND NAME(S): Aurovela [address] 1/20, Aurovela Fe, Bl[place] [address] Fe, Estrostep Fe, Gildess 24 Fe, Gildess Fe 1.5/30, Gildess Fe 1/20, Hailey 24 Fe, Hailey Fe 1.5/30, Junel Fe 1.5/30, Junel Fe 1/20, Junel Fe 24, Larin Fe, Lo Loestrin Fe, Loestrin 24 Fe, Loestrin FE 1.5/30, Loestrin FE 1/20, Lomedia 24 Fe, Microgestin 24 Fe, Microgestin Fe 1.5/30, Microgestin Fe 1/20, Tarina 24 Fe, Tarina Fe 1/20, Taytulla, Tilia Fe, Tri-Legest Fe What should I tell my health care provider before I take this medicine? They need to know if you have any of these conditions:  abnormal vaginal bleeding  blood vessel disease  breast, cervical, endometrial, ovarian, liver, or uterine cancer  diabetes  gallbladder disease  heart disease or recent heart attack  high blood pressure  high cholesterol  history of blood clots  kidney disease  liver disease  migraine headaches  smoke tobacco  stroke  systemic lupus erythematosus (SLE)  an unusual or allergic reaction to estrogens, progestins, other medicines, foods, dyes, or preservatives  pregnant or trying to get pregnant  breast-feeding How should I use this medicine? Take this medicine by mouth. To reduce nausea, this medicine may be taken with food. Follow the directions on the prescription label. Take this medicine at the same time each day and in the order directed on the package. Do  not take your medicine more often than directed. A patient package insert for the product will be given with each prescription and refill. Read this sheet carefully each time. The sheet may change frequently. Contact your pediatrician regarding the use of this medicine in children. Special care may be needed. This medicine has been used in female children who have started having menstrual periods. Overdosage: If you think you have taken too much of this medicine contact a poison control center or emergency room at once. NOTE: This medicine is only for you. Do not share this medicine with others. What if I miss a dose? If you miss a dose, refer to the patient information sheet you received with your medicine for direction. If you miss more than one pill, this medicine may not be as effective and you may need to use another form of birth control. What may interact with this medicine? Do not take this medicine with the following medication:  dasabuvir; ombitasvir; paritaprevir; ritonavir  ombitasvir; paritaprevir; ritonavir This medicine may also interact with the following medications:  acetaminophen  antibiotics or medicines for infections, especially rifampin, rifabutin, rifapentine, and griseofulvin, and possibly penicillins or tetracyclines  aprepitant  ascorbic acid (vitamin C)  atorvastatin  barbiturate medicines, such as phenobarbital  bosentan  carbamazepine  caffeine  clofibrate  cyclosporine  dantrolene  doxercalciferol  felbamate  grapefruit juice  hydrocortisone  medicines for anxiety or sleeping problems, such as diazepam or temazepam  medicines for diabetes, including pioglitazone  mineral oil  modafinil  mycophenolate  nefazodone  oxcarbazepine  phenytoin  prednisolone  ritonavir or other medicines for  HIV infection or AIDS  rosuvastatin  selegiline  soy isoflavones supplements  St. John's wort  tamoxifen or  raloxifene  theophylline  thyroid hormones  topiramate  warfarin This list may not describe all possible interactions. Give your health care provider a list of all the medicines, herbs, non-prescription drugs, or dietary supplements you use. Also tell them if you smoke, drink alcohol, or use illegal drugs. Some items may interact with your medicine. What should I watch for while using this medicine? Visit your doctor or health care professional for regular checks on your progress. You will need a regular breast and pelvic exam and Pap smear while on this medicine. Use an additional method of contraception during the first cycle that you take these tablets. If you have any reason to think you are pregnant, stop taking this medicine right away and contact your doctor or health care professional. If you are taking this medicine for hormone related problems, it may take several cycles of use to see improvement in your condition. Smoking increases the risk of getting a blood clot or having a stroke while you are taking birth control pills, especially if you are more than 24 years old. You are strongly advised not to smoke. This medicine can make your body retain fluid, making your fingers, hands, or ankles swell. Your blood pressure can go up. Contact your doctor or health care professional if you feel you are retaining fluid. This medicine can make you more sensitive to the sun. Keep out of the sun. If you cannot avoid being in the sun, wear protective clothing and use sunscreen. Do not use sun lamps or tanning beds/booths. If you wear contact lenses and notice visual changes, or if the lenses begin to feel uncomfortable, consult your eye care specialist. In some women, tenderness, swelling, or minor bleeding of the gums may occur. Notify your dentist if this happens. Brushing and flossing your teeth regularly may help limit this. See your dentist regularly and inform your dentist of the medicines you  are taking. If you are going to have elective surgery, you may need to stop taking this medicine before the surgery. Consult your health care professional for advice. This medicine does not protect you against HIV infection (AIDS) or any other sexually transmitted diseases. What side effects may I notice from receiving this medicine? Side effects that you should report to your doctor or health care professional as soon as possible:  allergic reactions like skin rash, itching or hives, swelling of the face, lips, or tongue  breast tissue changes or discharge  changes in vaginal bleeding during your period or between your periods  changes in vision  chest pain  confusion  coughing up blood  dizziness  feeling faint or lightheaded  headaches or migraines  leg, arm or groin pain  loss of balance or coordination  severe or sudden headaches  stomach pain (severe)  sudden shortness of breath  sudden numbness or weakness of the face, arm or leg  symptoms of vaginal infection like itching, irritation or unusual discharge  tenderness in the upper abdomen  trouble speaking or understanding  vomiting  yellowing of the eyes or skin Side effects that usually do not require medical attention (report to your doctor or health care professional if they continue or are bothersome):  breakthrough bleeding and spotting that continues beyond the 3 initial cycles of pills  breast tenderness  mood changes, anxiety, depression, frustration, anger, or emotional outbursts  increased sensitivity to sun  or ultraviolet light  nausea  skin rash, acne, or brown spots on the skin  weight gain (slight) This list may not describe all possible side effects. Call your doctor for medical advice about side effects. You may report side effects to FDA at 1-800-FDA-1088. Where should I keep my medicine? Keep out of the reach of children. Store at room temperature between 15 and 30 degrees C  (59 and 86 degrees F). Throw away any unused medicine after the expiration date. NOTE: This sheet is a summary. It may not cover all possible information. If you have questions about this medicine, talk to your doctor, pharmacist, or health care provider.  2020 Elsevier/Gold Standard (2015-10-19 08:04:41)

## 2019-09-16 NOTE — Progress Notes (Signed)
Pt present for birth control. Pt stated having right ovary pain x 3 weeks and would like to discuss birth control.  UPT Neg.

## 2019-11-07 ENCOUNTER — Ambulatory Visit: Payer: Managed Care, Other (non HMO) | Admitting: Certified Nurse Midwife

## 2019-11-12 ENCOUNTER — Ambulatory Visit (INDEPENDENT_AMBULATORY_CARE_PROVIDER_SITE_OTHER): Payer: Managed Care, Other (non HMO) | Admitting: Certified Nurse Midwife

## 2019-11-12 ENCOUNTER — Encounter: Payer: Self-pay | Admitting: Certified Nurse Midwife

## 2019-11-12 ENCOUNTER — Other Ambulatory Visit (INDEPENDENT_AMBULATORY_CARE_PROVIDER_SITE_OTHER): Payer: Managed Care, Other (non HMO)

## 2019-11-12 ENCOUNTER — Other Ambulatory Visit: Payer: Self-pay

## 2019-11-12 VITALS — BP 100/82 | Wt 195.6 lb

## 2019-11-12 DIAGNOSIS — Z8742 Personal history of other diseases of the female genital tract: Secondary | ICD-10-CM

## 2019-11-12 DIAGNOSIS — N83201 Unspecified ovarian cyst, right side: Secondary | ICD-10-CM

## 2019-11-12 MED ORDER — TRAMADOL HCL 50 MG PO TABS: 50.0000 mg | ORAL_TABLET | Freq: Four times a day (QID) | ORAL | 0 refills | Status: DC | PRN
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
## 2019-11-12 NOTE — Patient Instructions (Signed)
Ovarian Cyst An ovarian cyst is a fluid-filled sac on an ovary. The ovaries are organs that make eggs in women. Most ovarian cysts go away on their own and are not cancerous (are benign). Some cysts need treatment. Follow these instructions at home:  Take over-the-counter and prescription medicines only as told by your doctor.  Do not drive or use heavy machinery while taking prescription pain medicine.  Get pelvic exams and Pap tests as often as told by your doctor.  Return to your normal activities as told by your doctor. Ask your doctor what activities are safe for you.  Do not use any products that contain nicotine or tobacco, such as cigarettes and e-cigarettes. If you need help quitting, ask your doctor.  Keep all follow-up visits as told by your doctor. This is important. Contact a doctor if:  Your periods are: ? Late. ? Irregular. ? Painful.   Your periods stop.  You have pelvic pain that does not go away.  You have pressure on your bladder.  You have trouble making your bladder empty when you pee (urinate).  You have pain during sex.  You have any of the following in your belly (abdomen): ? A feeling of fullness. ? Pressure. ? Discomfort. ? Pain that does not go away. ? Swelling.  You feel sick most of the time.  You have trouble pooping (have constipation).  You are not as hungry as usual (you lose your appetite).  You get very bad acne.  You start to have more hair on your body and face.  You are gaining weight or losing weight without changing your exercise and eating habits.  You think you may be pregnant. Get help right away if:  You have belly pain that is very bad or gets worse.  You cannot eat or drink without throwing up (vomiting).  You suddenly get a fever.  Your period is a lot heavier than usual. This information is not intended to replace advice given to you by your health care provider. Make sure you discuss any questions you have  with your health care provider. Document Revised: 01/20/2017 Document Reviewed: 07/12/2015 Elsevier Patient Education  2020 [employer].

## 2019-12-16 ENCOUNTER — Encounter: Payer: Medicaid Other | Admitting: Certified Nurse Midwife

## 2020-01-27 ENCOUNTER — Encounter: Payer: Medicaid Other | Admitting: Certified Nurse Midwife

## 2020-02-18 ENCOUNTER — Other Ambulatory Visit: Payer: Self-pay

## 2020-02-18 ENCOUNTER — Emergency Department
Admission: EM | Admit: 2020-02-18 | Discharge: 2020-02-18 | Disposition: A | Discharge status: Home / Self Care | Payer: Managed Care, Other (non HMO) | Attending: Emergency Medicine | Admitting: Emergency Medicine

## 2020-02-18 DIAGNOSIS — R55 Syncope and collapse: Secondary | ICD-10-CM | POA: Insufficient documentation

## 2020-02-18 DIAGNOSIS — Z5321 Procedure and treatment not carried out due to patient leaving prior to being seen by health care provider: Secondary | ICD-10-CM | POA: Insufficient documentation

## 2020-02-18 DIAGNOSIS — R569 Unspecified convulsions: Secondary | ICD-10-CM | POA: Insufficient documentation

## 2020-02-18 LAB — BASIC METABOLIC PANEL
Anion gap: 6 (ref 5–15)
BUN: 11 mg/dL (ref 6–20)
CO2: 27 mmol/L (ref 22–32)
Calcium: 9.1 mg/dL (ref 8.9–10.3)
Chloride: 104 mmol/L (ref 98–111)
Creatinine, Ser: 0.76 mg/dL (ref 0.44–1.00)
GFR, Estimated: >60 mL/min (ref >60)
Glucose, Bld: 88 mg/dL (ref 70–99)
Potassium: 3.6 mmol/L (ref 3.5–5.1)
Sodium: 137 mmol/L (ref 135–145)

## 2020-02-18 LAB — URINALYSIS, COMPLETE (UACMP) WITH MICROSCOPIC
Bacteria, UA: NONE SEEN
Bilirubin Urine: NEGATIVE
Glucose, UA: NEGATIVE mg/dL
Ketones, ur: NEGATIVE mg/dL
Leukocytes,Ua: NEGATIVE
Nitrite: NEGATIVE
Protein, ur: NEGATIVE mg/dL
Specific Gravity, Urine: 1.01 (ref 1.005–1.030)
pH: 6 (ref 5.0–8.0)

## 2020-02-18 LAB — CBC
HCT: 44 % (ref 36.0–46.0)
Hemoglobin: 14.8 g/dL (ref 12.0–15.0)
MCH: 31.5 pg (ref 26.0–34.0)
MCHC: 33.6 g/dL (ref 30.0–36.0)
MCV: 93.6 fL (ref 80.0–100.0)
Platelets: 261 10*3/uL (ref 150–400)
RBC: 4.7 MIL/uL (ref 3.87–5.11)
RDW: 12.5 % (ref 11.5–15.5)
WBC: 9.8 10*3/uL (ref 4.0–10.5)
nRBC: 0 % (ref 0.0–0.2)

## 2020-02-18 LAB — POC URINE PREG, ED: Preg Test, Ur: NEGATIVE

## 2020-02-18 LAB — CBG MONITORING, ED: Glucose-Capillary: 86 mg/dL (ref 70–99)
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
## 2020-02-20 ENCOUNTER — Other Ambulatory Visit: Payer: Self-pay | Admitting: Internal Medicine

## 2020-02-20 DIAGNOSIS — R569 Unspecified convulsions: Secondary | ICD-10-CM

## 2020-02-23 ENCOUNTER — Ambulatory Visit
Admission: RE | Admit: 2020-02-23 | Discharge: 2020-02-23 | Disposition: A | Discharge status: Home / Self Care | Payer: Managed Care, Other (non HMO) | Source: Ambulatory Visit | Attending: Family Medicine | Admitting: Family Medicine

## 2020-02-23 ENCOUNTER — Other Ambulatory Visit: Payer: Self-pay

## 2020-02-23 VITALS — BP 100/79 | HR 81 | Temp 98.1°F | Resp 17 | Ht 65.0 in | Wt 185.0 lb

## 2020-02-23 DIAGNOSIS — H9203 Otalgia, bilateral: Secondary | ICD-10-CM | POA: Insufficient documentation

## 2020-02-23 DIAGNOSIS — Z20822 Contact with and (suspected) exposure to covid-19: Secondary | ICD-10-CM | POA: Insufficient documentation

## 2020-02-23 DIAGNOSIS — R519 Headache, unspecified: Secondary | ICD-10-CM | POA: Diagnosis present

## 2020-02-23 MED ORDER — NAPROXEN 500 MG PO TABS: 500.0000 mg | ORAL_TABLET | Freq: Two times a day (BID) | ORAL | 0 refills | Status: DC | PRN
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
## 2020-02-23 NOTE — Discharge Instructions (Signed)
Medication as prescribed.  There is no evidence of ear infection today.  Awaiting Covid test results.  Should be back in the next 24 hours.  Check your MyChart account.  Take care  Dr. Adriana Simas

## 2020-02-23 NOTE — ED Triage Notes (Incomplete)
Pt with left sided otalgia and feeling "drained" past few days. States congestion over past few months.

## 2020-02-24 ENCOUNTER — Ambulatory Visit: Payer: Managed Care, Other (non HMO)

## 2020-02-24 LAB — SARS CORONAVIRUS 2 (TAT 6-24 HRS): SARS Coronavirus 2: NEGATIVE

## 2020-02-27 ENCOUNTER — Ambulatory Visit: Payer: Managed Care, Other (non HMO)

## 2020-03-04 ENCOUNTER — Other Ambulatory Visit: Payer: Self-pay | Admitting: Neurology

## 2020-03-04 DIAGNOSIS — R569 Unspecified convulsions: Secondary | ICD-10-CM

## 2020-03-29 ENCOUNTER — Ambulatory Visit: Payer: Managed Care, Other (non HMO)

## 2020-04-07 ENCOUNTER — Ambulatory Visit: Admission: RE | Admit: 2020-04-07 | Payer: Managed Care, Other (non HMO) | Source: Ambulatory Visit

## 2020-05-28 ENCOUNTER — Other Ambulatory Visit: Payer: Self-pay

## 2020-05-28 ENCOUNTER — Ambulatory Visit
Admission: RE | Admit: 2020-05-28 | Discharge: 2020-05-28 | Disposition: A | Discharge status: Home / Self Care | Payer: Medicaid Other | Source: Ambulatory Visit | Attending: Neurology | Admitting: Neurology

## 2020-05-28 DIAGNOSIS — R569 Unspecified convulsions: Secondary | ICD-10-CM | POA: Insufficient documentation

## 2020-05-28 MED ORDER — GADOBUTROL 1 MMOL/ML IV SOLN
7.5000 mL | Freq: Once | INTRAVENOUS | Status: AC | PRN
  Administered 2020-05-28: 7.5 mL via INTRAVENOUS

## 2020-06-18 ENCOUNTER — Encounter: Payer: Medicaid Other | Admitting: Certified Nurse Midwife

## 2020-06-29 ENCOUNTER — Encounter: Payer: Self-pay | Admitting: Certified Nurse Midwife

## 2020-08-12 DIAGNOSIS — J40 Bronchitis, not specified as acute or chronic: Secondary | ICD-10-CM

## 2020-08-12 HISTORY — DX: Bronchitis, not specified as acute or chronic: J40

## 2020-08-20 ENCOUNTER — Other Ambulatory Visit: Payer: Self-pay

## 2020-08-20 ENCOUNTER — Encounter
Admission: RE | Admit: 2020-08-20 | Discharge: 2020-08-20 | Disposition: A | Discharge status: Home / Self Care | Payer: Medicaid Other | Source: Ambulatory Visit | Attending: Obstetrics and Gynecology | Admitting: Obstetrics and Gynecology

## 2020-08-20 HISTORY — DX: Anxiety disorder, unspecified: F41.9

## 2020-08-20 HISTORY — DX: Family history of other specified conditions: Z84.89

## 2020-08-20 HISTORY — DX: Unspecified convulsions: R56.9

## 2020-08-20 HISTORY — DX: Cardiac arrhythmia, unspecified: I49.9

## 2020-08-20 HISTORY — DX: Cerebral cysts: G93.0
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
## 2020-08-20 NOTE — Patient Instructions (Addendum)
Your procedure is scheduled on:08-28-20 Friday Report to the Registration Desk on the 1st floor of the [facility]-Then proceed to the 2nd floor Surgery Desk in the [place] To find out your arrival time, please call [phone] between 1PM - 3PM on:08-27-20 Thursday  REMEMBER: Instructions that are not followed completely may result in serious medical risk, up to and including death; or upon the discretion of your surgeon and anesthesiologist your surgery may need to be rescheduled.  Do not eat food after midnight the night before surgery.  No gum chewing, lozengers or hard candies.  You may however, drink CLEAR liquids up to 2 hours before you are scheduled to arrive for your surgery. Do not drink anything within 2 hours of your scheduled arrival time.  Clear liquids include: - water  - apple juice without pulp - gatorade (not RED, PURPLE, OR BLUE) - black coffee or tea (Do NOT add milk or creamers to the coffee or tea) Do NOT drink anything that is not on this list.  TAKE THESE MEDICATIONS THE MORNING OF SURGERY WITH A SIP OF WATER: -Topamax (Topiramate) -Cymbalta (Duloxetine) -Gabapentin (Neurontin) -You may take Hydroxyzine (Atarax) the day of surgery if needed for anxiety  One week prior to surgery: Stop Anti-inflammatories (NSAIDS) such as Advil, Aleve, Ibuprofen, Motrin, Naproxen, Naprosyn and Aspirin based products such as Excedrin, Goodys Powder, BC Powder.You may however, continue to take Tylenol if needed for pain up until the day of surgery.  Stop ANY OVER THE COUNTER supplements/vitamins NOW (08-20-20) until after surgery.  No Alcohol for 24 hours before or after surgery.  No Smoking including e-cigarettes for 24 hours prior to surgery.  No chewable tobacco products for at least 6 hours prior to surgery.  No nicotine patches on the day of surgery.  Do not use any "recreational" drugs for at least a week prior to your surgery.  Please be advised that the  combination of cocaine and anesthesia may have negative outcomes, up to and including death. If you test positive for cocaine, your surgery will be cancelled.  On the morning of surgery brush your teeth with toothpaste and water, you may rinse your mouth with mouthwash if you wish. Do not swallow any toothpaste or mouthwash.  Do not wear jewelry, make-up, hairpins, clips or nail polish.  Do not wear lotions, powders, or perfumes.   Do not shave body from the neck down 48 hours prior to surgery just in case you cut yourself which could leave a site for infection.  Also, freshly shaved skin may become irritated if using the CHG soap.  Contact lenses, hearing aids and dentures may not be worn into surgery.  Do not bring valuables to the hospital. [facility] is not responsible for any missing/lost belongings or valuables.   Use CHG Soap as directed on instruction sheet.   Notify your doctor if there is any change in your medical condition (cold, fever, infection).  Wear comfortable clothing (specific to your surgery type) to the hospital.  After surgery, you can help prevent lung complications by doing breathing exercises.  Take deep breaths and cough every 1-2 hours. Your doctor may order a device called an Incentive Spirometer to help you take deep breaths. When coughing or sneezing, hold a pillow firmly against your incision with both hands. This is called "splinting." Doing this helps protect your incision. It also decreases belly discomfort.  If you are being admitted to the hospital overnight, leave your suitcase in the car.  After surgery it may be brought to your room.  If you are being discharged the day of surgery, you will not be allowed to drive home. You will need a responsible adult (18 years or older) to drive you home and stay with you that night.   If you are taking public transportation, you will need to have a responsible adult (18 years or older) with you. Please  confirm with your physician that it is acceptable to use public transportation.   Please call the [facility] at [phone] if you have any questions about these instructions.  Surgery Visitation Policy:  Patients undergoing a surgery or procedure may have one family member or support person with them as long as that person is not COVID-19 positive or experiencing its symptoms.  That person may remain in the waiting area during the procedure.  Inpatient Visitation:    Visiting hours are 7 a.m. to 8 p.m. Inpatients will be allowed two visitors daily. The visitors may change each day during the patient's stay. No visitors under the age of 44. Any visitor under the age of 40 must be accompanied by an adult. The visitor must pass COVID-19 screenings, use hand sanitizer when entering and exiting the patient's room and wear a mask at all times, including in the patient's room. Patients must also wear a mask when staff or their visitor are in the room. Masking is required regardless of vaccination status.

## 2020-08-25 ENCOUNTER — Encounter
Admission: RE | Admit: 2020-08-25 | Discharge: 2020-08-25 | Disposition: A | Discharge status: Home / Self Care | Payer: Medicaid Other | Source: Ambulatory Visit | Attending: Obstetrics and Gynecology | Admitting: Obstetrics and Gynecology

## 2020-08-25 ENCOUNTER — Other Ambulatory Visit: Payer: Self-pay

## 2020-08-25 DIAGNOSIS — R000 Tachycardia, unspecified: Secondary | ICD-10-CM | POA: Diagnosis not present

## 2020-08-25 DIAGNOSIS — Z01818 Encounter for other preprocedural examination: Secondary | ICD-10-CM | POA: Insufficient documentation

## 2020-08-25 LAB — CBC
HCT: 41.9 % (ref 36.0–46.0)
Hemoglobin: 14.6 g/dL (ref 12.0–15.0)
MCH: 32.4 pg (ref 26.0–34.0)
MCHC: 34.8 g/dL (ref 30.0–36.0)
MCV: 93.1 fL (ref 80.0–100.0)
Platelets: 285 10*3/uL (ref 150–400)
RBC: 4.5 MIL/uL (ref 3.87–5.11)
RDW: 12.7 % (ref 11.5–15.5)
WBC: 8.3 10*3/uL (ref 4.0–10.5)
nRBC: 0 % (ref 0.0–0.2)

## 2020-08-25 LAB — BASIC METABOLIC PANEL
Anion gap: 6 (ref 5–15)
BUN: 8 mg/dL (ref 6–20)
CO2: 25 mmol/L (ref 22–32)
Calcium: 9.2 mg/dL (ref 8.9–10.3)
Chloride: 109 mmol/L (ref 98–111)
Creatinine, Ser: 0.73 mg/dL (ref 0.44–1.00)
GFR, Estimated: >60 mL/min (ref >60)
Glucose, Bld: 89 mg/dL (ref 70–99)
Potassium: 3.2 mmol/L — ABNORMAL LOW (ref 3.5–5.1)
Sodium: 140 mmol/L (ref 135–145)

## 2020-08-27 MED ORDER — ORAL CARE MOUTH RINSE
15.0000 mL | Freq: Once | OROMUCOSAL | Status: AC
Start: 2020-08-27 — End: 2020-08-28

## 2020-08-27 MED ORDER — GABAPENTIN 300 MG PO CAPS: 300.0000 mg | ORAL_CAPSULE | ORAL | Status: DC

## 2020-08-27 MED ORDER — LACTATED RINGERS IV SOLN: INTRAVENOUS | Status: DC

## 2020-08-27 MED ORDER — ACETAMINOPHEN 500 MG PO TABS: 1000.0000 mg | ORAL_TABLET | ORAL | Status: AC

## 2020-08-27 MED ORDER — FAMOTIDINE 20 MG PO TABS: 20.0000 mg | ORAL_TABLET | Freq: Once | ORAL | Status: AC

## 2020-08-27 MED ORDER — POVIDONE-IODINE 10 % EX SWAB: 2.0000 "application " | Freq: Once | CUTANEOUS | Status: DC

## 2020-08-27 MED ORDER — CHLORHEXIDINE GLUCONATE 0.12 % MT SOLN
15.0000 mL | Freq: Once | OROMUCOSAL | Status: AC
Start: 2020-08-27 — End: 2020-08-28

## 2020-08-28 ENCOUNTER — Other Ambulatory Visit: Payer: Self-pay

## 2020-08-28 ENCOUNTER — Ambulatory Visit
Admission: RE | Admit: 2020-08-28 | Discharge: 2020-08-28 | Disposition: A | Discharge status: Home / Self Care | Payer: Medicaid Other | Attending: Obstetrics and Gynecology | Admitting: Obstetrics and Gynecology

## 2020-08-28 ENCOUNTER — Encounter: Payer: Self-pay | Admitting: Obstetrics and Gynecology

## 2020-08-28 ENCOUNTER — Ambulatory Visit: Payer: Medicaid Other | Admitting: Urgent Care

## 2020-08-28 ENCOUNTER — Encounter
Admission: RE | Disposition: A | Discharge status: Home / Self Care | Payer: Self-pay | Source: Home / Self Care | Attending: Obstetrics and Gynecology

## 2020-08-28 DIAGNOSIS — Z79811 Long term (current) use of aromatase inhibitors: Secondary | ICD-10-CM | POA: Insufficient documentation

## 2020-08-28 DIAGNOSIS — N83202 Unspecified ovarian cyst, left side: Secondary | ICD-10-CM | POA: Insufficient documentation

## 2020-08-28 DIAGNOSIS — Z79899 Other long term (current) drug therapy: Secondary | ICD-10-CM | POA: Diagnosis not present

## 2020-08-28 DIAGNOSIS — N801 Endometriosis of ovary: Secondary | ICD-10-CM | POA: Diagnosis not present

## 2020-08-28 DIAGNOSIS — N800 Endometriosis of uterus: Secondary | ICD-10-CM | POA: Insufficient documentation

## 2020-08-28 DIAGNOSIS — Z791 Long term (current) use of non-steroidal anti-inflammatories (NSAID): Secondary | ICD-10-CM | POA: Insufficient documentation

## 2020-08-28 DIAGNOSIS — G8929 Other chronic pain: Secondary | ICD-10-CM | POA: Diagnosis present

## 2020-08-28 DIAGNOSIS — N803 Endometriosis of pelvic peritoneum: Secondary | ICD-10-CM | POA: Diagnosis not present

## 2020-08-28 HISTORY — PX: CHROMOPERTUBATION: SHX6288

## 2020-08-28 HISTORY — PX: LAPAROSCOPIC OVARIAN CYSTECTOMY: SHX6248

## 2020-08-28 HISTORY — PX: LAPAROSCOPY: SHX197

## 2020-08-28 LAB — TYPE AND SCREEN
ABO/RH(D): O NEG
Antibody Screen: POSITIVE
Unit division: 0
Unit division: 0

## 2020-08-28 LAB — BPAM RBC
Blood Product Expiration Date: 202207302359
Blood Product Expiration Date: 202207302359
Unit Type and Rh: 9500
Unit Type and Rh: 9500

## 2020-08-28 LAB — POCT PREGNANCY, URINE: Preg Test, Ur: NEGATIVE

## 2020-08-28 SURGERY — LAPAROSCOPY OPERATIVE
Anesthesia: General

## 2020-08-28 MED ORDER — KETOROLAC TROMETHAMINE 30 MG/ML IJ SOLN
INTRAMUSCULAR | Status: DC | PRN
  Administered 2020-08-28: 30 mg via INTRAVENOUS

## 2020-08-28 MED ORDER — FAMOTIDINE 20 MG PO TABS
ORAL_TABLET | ORAL | Status: AC
  Administered 2020-08-28: 20 mg via ORAL
  Filled 2020-08-28: qty 1

## 2020-08-28 MED ORDER — MIDAZOLAM HCL 2 MG/2ML IJ SOLN
INTRAMUSCULAR | Status: AC
  Filled 2020-08-28: qty 2

## 2020-08-28 MED ORDER — ONDANSETRON HCL 4 MG/2ML IJ SOLN
INTRAMUSCULAR | Status: AC
  Filled 2020-08-28: qty 2

## 2020-08-28 MED ORDER — FENTANYL CITRATE (PF) 100 MCG/2ML IJ SOLN
INTRAMUSCULAR | Status: DC | PRN
  Administered 2020-08-28 (×2): 50 ug via INTRAVENOUS
  Administered 2020-08-28: 100 ug via INTRAVENOUS

## 2020-08-28 MED ORDER — GABAPENTIN 800 MG PO TABS: 800.0000 mg | ORAL_TABLET | Freq: Every day | ORAL | 0 refills | Status: DC

## 2020-08-28 MED ORDER — PHENYLEPHRINE HCL (PRESSORS) 10 MG/ML IV SOLN
INTRAVENOUS | Status: DC | PRN
  Administered 2020-08-28: 100 ug via INTRAVENOUS

## 2020-08-28 MED ORDER — BUPIVACAINE HCL (PF) 0.5 % IJ SOLN
INTRAMUSCULAR | Status: AC
  Filled 2020-08-28: qty 30

## 2020-08-28 MED ORDER — LACTATED RINGERS IV SOLN: INTRAVENOUS | Status: DC | PRN

## 2020-08-28 MED ORDER — DEXMEDETOMIDINE (PRECEDEX) IN NS 20 MCG/5ML (4 MCG/ML) IV SYRINGE
PREFILLED_SYRINGE | INTRAVENOUS | Status: DC | PRN
  Administered 2020-08-28: 12 ug via INTRAVENOUS
  Administered 2020-08-28: 8 ug via INTRAVENOUS

## 2020-08-28 MED ORDER — OXYCODONE HCL 5 MG PO TABS
ORAL_TABLET | ORAL | Status: AC
  Filled 2020-08-28: qty 1

## 2020-08-28 MED ORDER — ALBUTEROL SULFATE HFA 108 (90 BASE) MCG/ACT IN AERS
INHALATION_SPRAY | RESPIRATORY_TRACT | Status: AC
  Filled 2020-08-28: qty 13.4

## 2020-08-28 MED ORDER — CHLORHEXIDINE GLUCONATE 0.12 % MT SOLN
OROMUCOSAL | Status: AC
  Administered 2020-08-28: 15 mL via OROMUCOSAL
  Filled 2020-08-28: qty 15

## 2020-08-28 MED ORDER — FENTANYL CITRATE (PF) 100 MCG/2ML IJ SOLN
25.0000 ug | INTRAMUSCULAR | Status: DC | PRN
  Administered 2020-08-28 (×4): 25 ug via INTRAVENOUS

## 2020-08-28 MED ORDER — DOCUSATE SODIUM 100 MG PO CAPS: 100.0000 mg | ORAL_CAPSULE | Freq: Two times a day (BID) | ORAL | 0 refills | Status: DC

## 2020-08-28 MED ORDER — ALBUTEROL SULFATE HFA 108 (90 BASE) MCG/ACT IN AERS
INHALATION_SPRAY | RESPIRATORY_TRACT | Status: DC | PRN
  Administered 2020-08-28: 2 via RESPIRATORY_TRACT

## 2020-08-28 MED ORDER — STERILE WATER FOR IRRIGATION IR SOLN
Status: DC | PRN
  Administered 2020-08-28: 60 mL

## 2020-08-28 MED ORDER — FENTANYL CITRATE (PF) 100 MCG/2ML IJ SOLN
INTRAMUSCULAR | Status: AC
  Administered 2020-08-28: 25 ug via INTRAVENOUS
  Filled 2020-08-28: qty 2

## 2020-08-28 MED ORDER — GABAPENTIN 300 MG PO CAPS
ORAL_CAPSULE | ORAL | Status: AC
  Filled 2020-08-28: qty 1

## 2020-08-28 MED ORDER — PROPOFOL 10 MG/ML IV BOLUS
INTRAVENOUS | Status: DC | PRN
  Administered 2020-08-28: 20 mg via INTRAVENOUS
  Administered 2020-08-28: 100 mg via INTRAVENOUS
  Administered 2020-08-28: 50 mg via INTRAVENOUS

## 2020-08-28 MED ORDER — BUPIVACAINE HCL 0.5 % IJ SOLN
INTRAMUSCULAR | Status: DC | PRN
  Administered 2020-08-28: 9 mL

## 2020-08-28 MED ORDER — FENTANYL CITRATE (PF) 100 MCG/2ML IJ SOLN
INTRAMUSCULAR | Status: AC
  Filled 2020-08-28: qty 2

## 2020-08-28 MED ORDER — OXYCODONE HCL 5 MG PO TABS: 5.0000 mg | ORAL_TABLET | ORAL | 0 refills | Status: DC | PRN

## 2020-08-28 MED ORDER — SUGAMMADEX SODIUM 200 MG/2ML IV SOLN
INTRAVENOUS | Status: DC | PRN
  Administered 2020-08-28: 200 mg via INTRAVENOUS

## 2020-08-28 MED ORDER — DEXMEDETOMIDINE (PRECEDEX) IN NS 20 MCG/5ML (4 MCG/ML) IV SYRINGE
PREFILLED_SYRINGE | INTRAVENOUS | Status: AC
  Filled 2020-08-28: qty 10

## 2020-08-28 MED ORDER — PROPOFOL 10 MG/ML IV BOLUS
INTRAVENOUS | Status: AC
  Filled 2020-08-28: qty 20

## 2020-08-28 MED ORDER — IBUPROFEN 800 MG PO TABS: 800.0000 mg | ORAL_TABLET | Freq: Three times a day (TID) | ORAL | 1 refills | Status: AC

## 2020-08-28 MED ORDER — MIDAZOLAM HCL 2 MG/2ML IJ SOLN
INTRAMUSCULAR | Status: DC | PRN
  Administered 2020-08-28: 2 mg via INTRAVENOUS

## 2020-08-28 MED ORDER — ONDANSETRON HCL 4 MG/2ML IJ SOLN
4.0000 mg | Freq: Once | INTRAMUSCULAR | Status: AC | PRN
  Administered 2020-08-28: 4 mg via INTRAVENOUS

## 2020-08-28 MED ORDER — ROCURONIUM BROMIDE 100 MG/10ML IV SOLN
INTRAVENOUS | Status: DC | PRN
  Administered 2020-08-28: 50 mg via INTRAVENOUS

## 2020-08-28 MED ORDER — LIDOCAINE HCL (CARDIAC) PF 100 MG/5ML IV SOSY
PREFILLED_SYRINGE | INTRAVENOUS | Status: DC | PRN
  Administered 2020-08-28: 60 mg via INTRAVENOUS

## 2020-08-28 MED ORDER — ONDANSETRON HCL 4 MG/2ML IJ SOLN
INTRAMUSCULAR | Status: DC | PRN
  Administered 2020-08-28: 4 mg via INTRAVENOUS

## 2020-08-28 MED ORDER — DEXAMETHASONE SODIUM PHOSPHATE 10 MG/ML IJ SOLN
INTRAMUSCULAR | Status: DC | PRN
  Administered 2020-08-28: 4 mg via INTRAVENOUS

## 2020-08-28 MED ORDER — SILVER NITRATE-POT NITRATE 75-25 % EX MISC
CUTANEOUS | Status: AC
  Filled 2020-08-28: qty 10

## 2020-08-28 MED ORDER — ACETAMINOPHEN 500 MG PO TABS
ORAL_TABLET | ORAL | Status: AC
  Administered 2020-08-28: 1000 mg via ORAL
  Filled 2020-08-28: qty 2

## 2020-08-28 MED ORDER — OXYCODONE HCL 5 MG PO TABS
5.0000 mg | ORAL_TABLET | Freq: Once | ORAL | Status: AC
  Administered 2020-08-28: 5 mg via ORAL

## 2020-08-28 MED ORDER — ACETAMINOPHEN 500 MG PO TABS: 1000.0000 mg | ORAL_TABLET | Freq: Four times a day (QID) | ORAL | 0 refills | Status: AC

## 2020-08-28 MED ORDER — METHYLENE BLUE 0.5 % INJ SOLN
INTRAVENOUS | Status: AC
  Filled 2020-08-28: qty 10

## 2020-08-28 SURGICAL SUPPLY — 58 items
ADH SKN CLS APL DERMABOND .7 (GAUZE/BANDAGES/DRESSINGS) ×3
APL PRP STRL LF DISP 70% ISPRP (MISCELLANEOUS) ×3
APL SRG 38 LTWT LNG FL B (MISCELLANEOUS) ×3
APPLICATOR ARISTA FLEXITIP XL (MISCELLANEOUS) ×4 IMPLANT
BACTOSHIELD CHG 4% 4OZ (MISCELLANEOUS) ×1
BAG DRN RND TRDRP ANRFLXCHMBR (UROLOGICAL SUPPLIES) ×3
BAG SPEC RTRVL LRG 6X4 10 (ENDOMECHANICALS)
BAG URINE DRAIN 2000ML AR STRL (UROLOGICAL SUPPLIES) ×4 IMPLANT
BLADE SURG SZ11 CARB STEEL (BLADE) ×4 IMPLANT
CATH FOLEY 2WAY  5CC 16FR (CATHETERS) ×1
CATH FOLEY 2WAY 5CC 16FR (CATHETERS) ×3
CATH URTH 16FR FL 2W BLN LF (CATHETERS) ×3 IMPLANT
CHLORAPREP W/TINT 26 (MISCELLANEOUS) ×4 IMPLANT
CORD MONOPOLAR M/FML 12FT (MISCELLANEOUS) ×4 IMPLANT
DERMABOND ADVANCED (GAUZE/BANDAGES/DRESSINGS) ×1
DERMABOND ADVANCED .7 DNX12 (GAUZE/BANDAGES/DRESSINGS) ×3 IMPLANT
DRAPE GENERAL ENDO 106X123.5 (DRAPES) ×4 IMPLANT
DRAPE STERI POUCH LG 24X46 STR (DRAPES) IMPLANT
DRAPE UNDER BUTTOCK W/FLU (DRAPES) ×4 IMPLANT
GAUZE 4X4 16PLY ~~LOC~~+RFID DBL (SPONGE) ×4 IMPLANT
GLOVE SURG ENC MOIS LTX SZ7 (GLOVE) ×8 IMPLANT
GLOVE SURG UNDER LTX SZ7.5 (GLOVE) ×4 IMPLANT
GOWN STRL REUS W/ TWL LRG LVL3 (GOWN DISPOSABLE) ×6 IMPLANT
GOWN STRL REUS W/TWL LRG LVL3 (GOWN DISPOSABLE) ×8
HEMOSTAT ARISTA ABSORB 3G PWDR (HEMOSTASIS) IMPLANT
IRRIGATION STRYKERFLOW (MISCELLANEOUS) ×3 IMPLANT
IRRIGATOR STRYKERFLOW (MISCELLANEOUS) ×4
IV NS 1000ML (IV SOLUTION) ×4
IV NS 1000ML BAXH (IV SOLUTION) ×3 IMPLANT
KIT PINK PAD W/HEAD ARE REST (MISCELLANEOUS) ×4
KIT PINK PAD W/HEAD ARM REST (MISCELLANEOUS) ×3 IMPLANT
KIT TURNOVER CYSTO (KITS) ×4 IMPLANT
L-HOOK LAP DISP 36CM (ELECTROSURGICAL)
LABEL OR SOLS (LABEL) ×4 IMPLANT
LHOOK LAP DISP 36CM (ELECTROSURGICAL) IMPLANT
LIGASURE VESSEL 5MM BLUNT TIP (ELECTROSURGICAL) IMPLANT
MANIFOLD NEPTUNE II (INSTRUMENTS) ×4 IMPLANT
NEEDLE FILTER BLUNT 18X 1/2SAF (NEEDLE) ×1
NEEDLE FILTER BLUNT 18X1 1/2 (NEEDLE) ×3 IMPLANT
NS IRRIG 500ML POUR BTL (IV SOLUTION) ×4 IMPLANT
PACK GYN LAPAROSCOPIC (MISCELLANEOUS) ×4 IMPLANT
PAD OB MATERNITY 4.3X12.25 (PERSONAL CARE ITEMS) ×4 IMPLANT
PAD PREP 24X41 OB/GYN DISP (PERSONAL CARE ITEMS) ×4 IMPLANT
PENCIL ELECTRO HAND CTR (MISCELLANEOUS) IMPLANT
POUCH SPECIMEN RETRIEVAL 10MM (ENDOMECHANICALS) IMPLANT
SCISSORS METZENBAUM CVD 33 (INSTRUMENTS) IMPLANT
SCRUB CHG 4% DYNA-HEX 4OZ (MISCELLANEOUS) ×3 IMPLANT
SET TUBE SMOKE EVAC HIGH FLOW (TUBING) ×4 IMPLANT
SLEEVE ENDOPATH XCEL 5M (ENDOMECHANICALS) ×4 IMPLANT
STRIP CLOSURE SKIN 1/4X4 (GAUZE/BANDAGES/DRESSINGS) ×4 IMPLANT
SUT MNCRL AB 4-0 PS2 18 (SUTURE) ×4 IMPLANT
SUT VIC AB 2-0 UR6 27 (SUTURE) ×4 IMPLANT
SUT VIC AB 4-0 SH 27 (SUTURE) ×4
SUT VIC AB 4-0 SH 27XANBCTRL (SUTURE) ×3 IMPLANT
SYR 50ML LL SCALE MARK (SYRINGE) ×4 IMPLANT
SYR 5ML LL (SYRINGE) ×4 IMPLANT
TROCAR XCEL NON-BLD 5MMX100MML (ENDOMECHANICALS) ×4 IMPLANT
TUBING ART PRESS 48 MALE/FEM (TUBING) ×4 IMPLANT

## 2020-08-28 NOTE — Transfer of Care (Signed)
Immediate Anesthesia Transfer of Care Note  Patient: Sarah Haney  Procedure(s) Performed: PERITONEAL BIOPSIES Laparoscopic Excision of endometriosis (Bilateral) CHROMOPERTUBATION  Patient Location: PACU  Anesthesia Type:General  Level of Consciousness: drowsy  Airway & Oxygen Therapy: Patient Spontanous Breathing and Patient connected to face mask oxygen  Post-op Assessment: Report given to RN and Post -op Vital signs reviewed and stable  Post vital signs: Reviewed and stable  Last Vitals:  Vitals Value Taken Time  BP 118/90 08/28/20 1137  Temp 36.3 C 08/28/20 1137  Pulse 144 08/28/20 1139  Resp 18 08/28/20 1139  SpO2 100 % 08/28/20 1139  Vitals shown include unvalidated device data.  Last Pain:  Vitals:   08/28/20 0921  TempSrc: Oral  PainSc: 4          Complications: No notable events documented.

## 2020-08-28 NOTE — Interval H&P Note (Signed)
History and Physical Interval Note:  08/28/2020 10:08 AM  Sarah Haney  has presented today for surgery, with the diagnosis of chronic pelvic pain,ovarian cyst.  The various methods of treatment have been discussed with the patient and family. After consideration of risks, benefits and other options for treatment, the patient has consented to  Procedure(s): LAPAROSCOPY DIAGNOSTIC,PERITONEAL BIOPSIES (N/A) LAPAROSCOPIC OVARIAN CYSTECTOMY (Bilateral) as a surgical intervention. We had discussed in her preop visit chromotubation as well, and patient requests this today - we have added it to the consent and she has initialed it. The patient's history has been reviewed, patient examined, no change in status, stable for surgery.  I have reviewed the patient's chart and labs.  Questions were answered to the patient's satisfaction.     Benjaman Kindler
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
## 2020-08-28 NOTE — Anesthesia Procedure Notes (Signed)
Date/Time: 08/28/2020 10:27 AM Performed by: Nani Ravens, CRNA Pre-anesthesia Checklist: Patient identified, Emergency Drugs available, Suction available, Patient being monitored and Timeout performed Patient Re-evaluated:Patient Re-evaluated prior to induction Oxygen Delivery Method: Circle system utilized Preoxygenation: Pre-oxygenation with 100% oxygen Induction Type: IV induction Ventilation: Mask ventilation without difficulty Laryngoscope Size: McGraph and 3 Grade View: Grade I Tube type: Oral Tube size: 7.0 mm Number of attempts: 1 Airway Equipment and Method: Stylet Placement Confirmation: ETT inserted through vocal cords under direct vision, positive ETCO2, CO2 detector and breath sounds checked- equal and bilateral Secured at: 21 cm Tube secured with: Tape Dental Injury: Teeth and Oropharynx as per pre-operative assessment  Comments: Patient has jagged teeth prior to intubation.
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
## 2020-08-28 NOTE — Interval H&P Note (Deleted)
History and Physical Interval Note:  08/28/2020 10:00 AM  Sarah Haney  has presented today for surgery, with the diagnosis of chronic pelvic pain,ovarian cyst.  The various methods of treatment have been discussed with the patient and family. After consideration of risks, benefits and other options for treatment, the patient has consented to  Procedure(s): LAPAROSCOPY DIAGNOSTIC,PERITONEAL BIOPSIES (N/A) LAPAROSCOPIC OVARIAN CYSTECTOMY (Bilateral) as a surgical intervention.  The patient's history has been reviewed, patient examined, no change in status, stable for surgery.  I have reviewed the patient's chart and labs.  Questions were answered to the patient's satisfaction.     Benjaman Kindler

## 2020-08-28 NOTE — Discharge Instructions (Addendum)
Laparoscopic Ovarian Surgery Discharge Instructions  For the next three days, take ibuprofen and acetaminophen on a schedule, every 8 hours. You can take them together or you can intersperse them, and take one every four hours. I also gave you gabapentin for nighttime, to help you sleep and also to control pain. Take gabapentin medicines at night for at least the next 3 nights. You also have a narcotic, oxycodone, to take as needed if the above medicines don't help.  Postop constipation is a major cause of pain. Stay well hydrated, walk as you tolerate, and take over the counter senna as well as stool softeners if you need them.   RISKS AND COMPLICATIONS  Infection. Bleeding. Injury to surrounding organs. Anesthetic side effects.   PROCEDURE  You may be given a medicine to help you relax (sedative) before the procedure. You will be given a medicine to make you sleep (general anesthetic) during the procedure. A tube will be put down your throat to help your breath while under general anesthesia. Several small cuts (incisions) are made in the lower abdominal area and one incision is made near the belly button. Your abdominal area will be inflated with a safe gas (carbon dioxide). This helps give the surgeon room to operate, visualize, and helps the surgeon avoid other organs. A thin, lighted tube (laparoscope) with a camera attached is inserted into your abdomen through the incision near the belly button. Other small instruments may also be inserted through other abdominal incisions. The ovary is located and are removed. After the ovary is removed, the gas is released from the abdomen. The incisions will be closed with stitches (sutures), and Dermabond. A bandage may be placed over the incisions.  AFTER THE PROCEDURE  You will also have some mild abdominal discomfort for 3-7 days. You will be given pain medicine to ease any discomfort. As long as there are no problems, you may be allowed to  go home. Someone will need to drive you home and be with you for at least 24 hours once home. You may have some mild discomfort in the throat. This is from the tube placed in your throat while you were sleeping. You may experience discomfort in the shoulder area from some trapped air between the liver and diaphragm. This sensation is normal and will slowly go away on its own.  HOME CARE INSTRUCTIONS  Take all medicines as directed. Only take over-the-counter or prescription medicines for pain, discomfort, or fever as directed by your caregiver. Resume daily activities as directed. Showers are preferred over baths for 2 weeks. You may resume sexual activities in 1 week or as you feel you would like to. Do not drive while taking narcotics.  SEEK MEDICAL CARE IF: . There is increasing abdominal pain. You feel lightheaded or faint. You have the chills. You have an oral temperature above 102 F (38.9 C). There is pus-like (purulent) drainage from any of the wounds. You are unable to pass gas or have a bowel movement. You feel sick to your stomach (nauseous) or throw up (vomit) and can't control it with your medicines.  MAKE SURE YOU:  Understand these instructions. Will watch your condition. Will get help right away if you are not doing well or get worse.  [employer] Patient Information 2013 [employer]     AMBULATORY SURGERY  DISCHARGE INSTRUCTIONS   The drugs that you were given will stay in your system until tomorrow so for the next 24 hours you should not:    Drive an automobile Make any legal decisions Drink any alcoholic beverage   You may resume regular meals tomorrow.  Today it is better to start with liquids and gradually work up to solid foods.  You may eat anything you prefer, but it is better to start with liquids, then soup and crackers, and gradually work up to solid foods.   Please notify your doctor immediately if you have any unusual bleeding, trouble  breathing, redness and pain at the surgery site, drainage, fever, or pain not relieved by medication.    Additional Instructions:   Please contact your physician with any problems or Same Day Surgery at [phone], Monday through Friday 6 am to 4 pm, or [place] [facility] number at [phone].  

## 2020-08-31 LAB — SURGICAL PATHOLOGY

## 2020-09-01 NOTE — Anesthesia Postprocedure Evaluation (Signed)
Anesthesia Post Note  Patient: SHARELLE BURDITT  Procedure(s) Performed: PERITONEAL BIOPSIES Laparoscopic Excision of endometriosis (Bilateral) CHROMOPERTUBATION  Patient location during evaluation: PACU Anesthesia Type: General Level of consciousness: awake and alert Pain management: pain level controlled Vital Signs Assessment: post-procedure vital signs reviewed and stable Respiratory status: spontaneous breathing, nonlabored ventilation, respiratory function stable and patient connected to nasal cannula oxygen Cardiovascular status: blood pressure returned to baseline and stable Postop Assessment: no apparent nausea or vomiting Anesthetic complications: no   No notable events documented.   Last Vitals:  Vitals:   08/28/20 1230 08/28/20 1241  BP: 101/63 125/75  Pulse: (!) 130 (!) 139  Resp: 17 20  Temp: 36.5 C 36.6 C  SpO2: 99% 100%    Last Pain:  Vitals:   08/28/20 1241  TempSrc: Temporal  PainSc: 0-No pain                 Molli Barrows

## 2020-09-11 ENCOUNTER — Other Ambulatory Visit (HOSPITAL_COMMUNITY): Payer: Self-pay | Admitting: Family Medicine

## 2020-09-11 ENCOUNTER — Other Ambulatory Visit: Payer: Self-pay | Admitting: Family Medicine

## 2020-09-11 DIAGNOSIS — R110 Nausea: Secondary | ICD-10-CM

## 2020-09-11 DIAGNOSIS — R1013 Epigastric pain: Secondary | ICD-10-CM

## 2020-09-24 ENCOUNTER — Ambulatory Visit
Admission: RE | Admit: 2020-09-24 | Discharge: 2020-09-24 | Disposition: A | Discharge status: Home / Self Care | Payer: Medicaid Other | Source: Ambulatory Visit | Attending: Family Medicine | Admitting: Family Medicine

## 2020-09-24 ENCOUNTER — Other Ambulatory Visit: Payer: Self-pay

## 2020-09-24 DIAGNOSIS — R1013 Epigastric pain: Secondary | ICD-10-CM | POA: Diagnosis present

## 2020-09-24 DIAGNOSIS — R110 Nausea: Secondary | ICD-10-CM | POA: Diagnosis present

## 2020-10-28 IMAGING — US US ABDOMEN LIMITED
1 series · 14 of 25 positions shown · non-contrast
Comparison: None

CLINICAL DATA: Epigastric pain, elevated LFTs

EXAM:
ULTRASOUND ABDOMEN LIMITED RIGHT UPPER QUADRANT

[Series 1: us abdomen limited · 56 acquisitions, 14 frames shown]
[im 1/56]
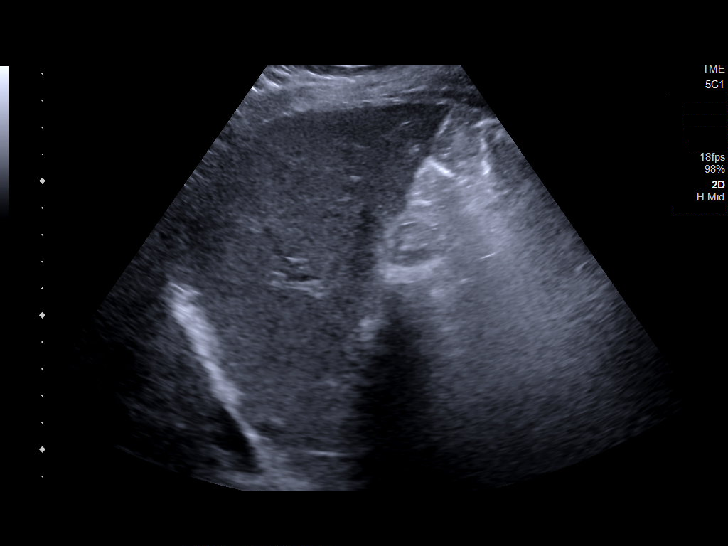
[im 5/56]
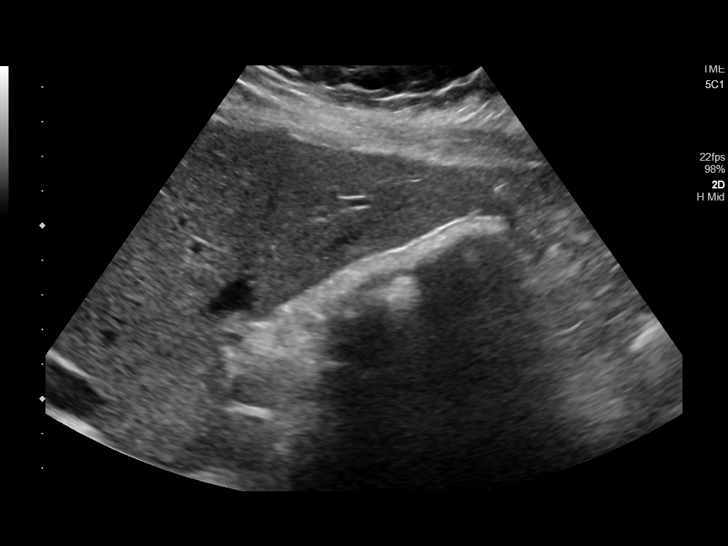
[im 10/56]
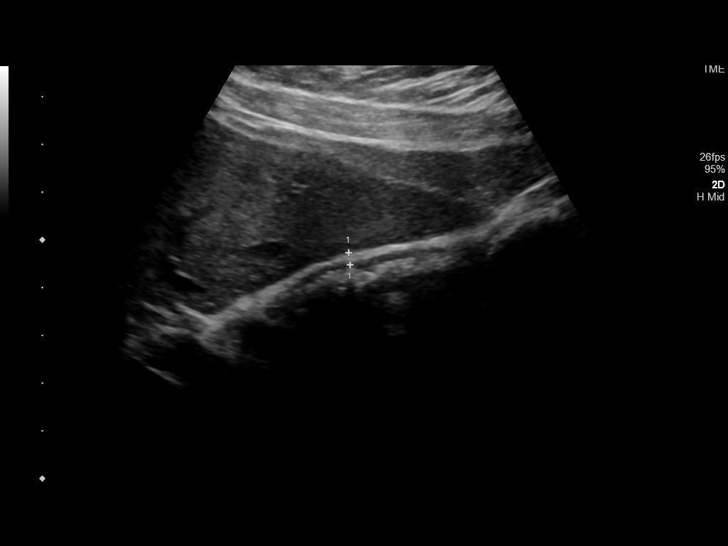
[im 14/56]
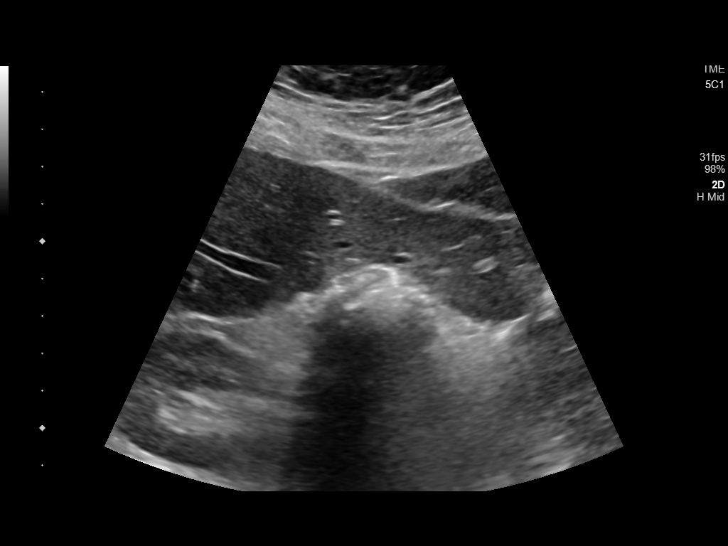
[im 19/56]
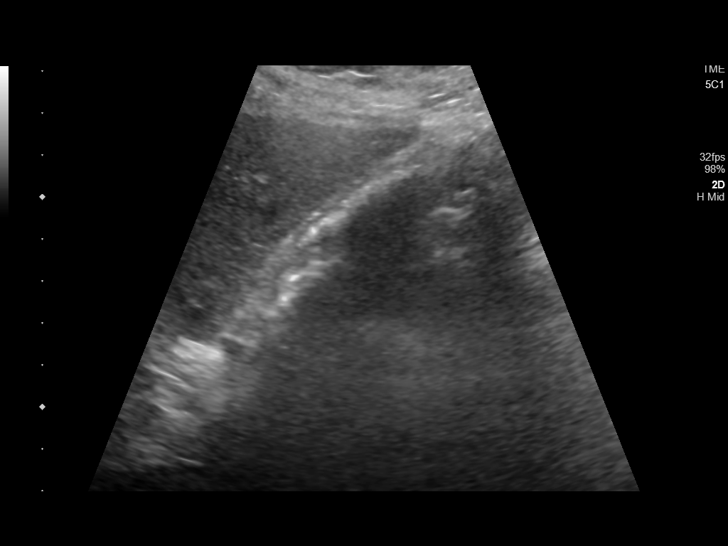
[im 21/56]
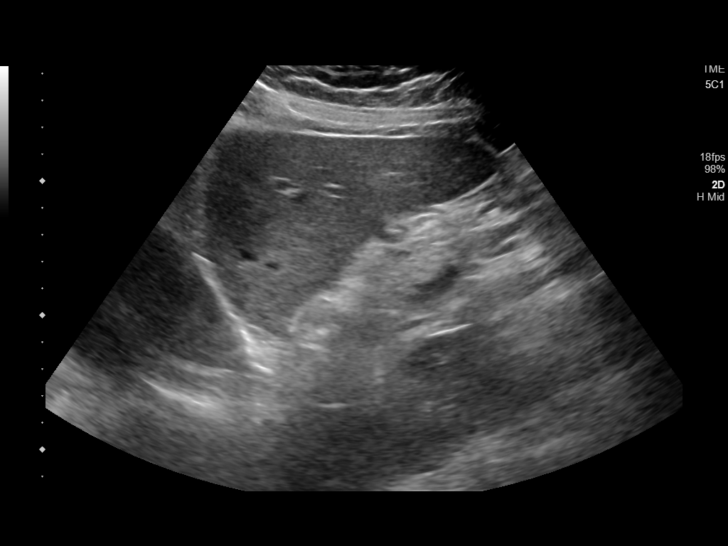
[im 26/56]
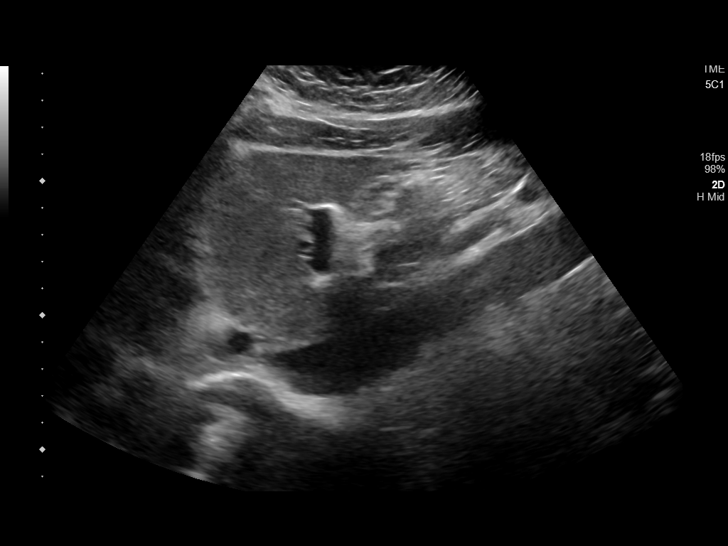
[im 30/56]
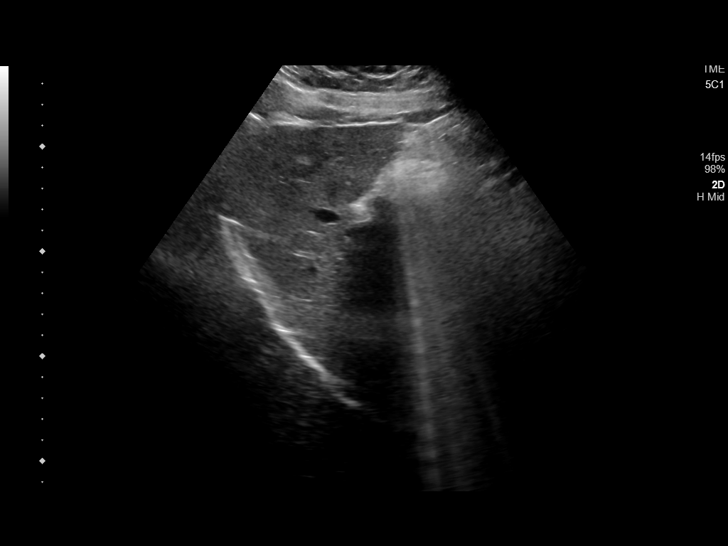
[im 35/56]
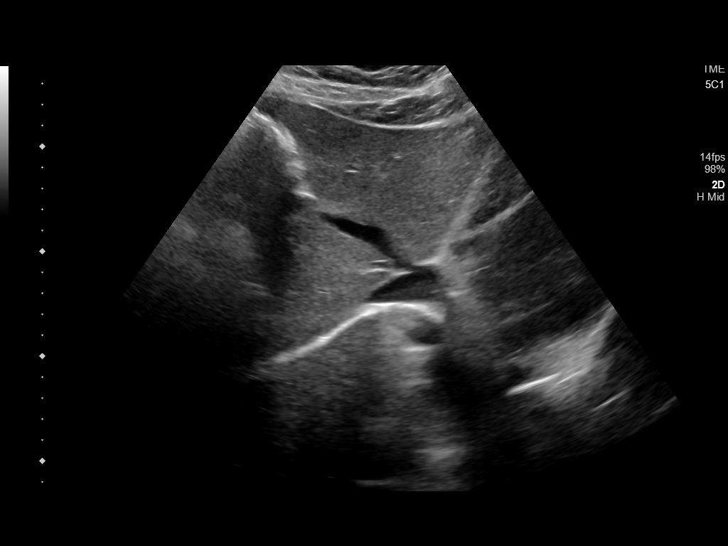
[im 37/56]
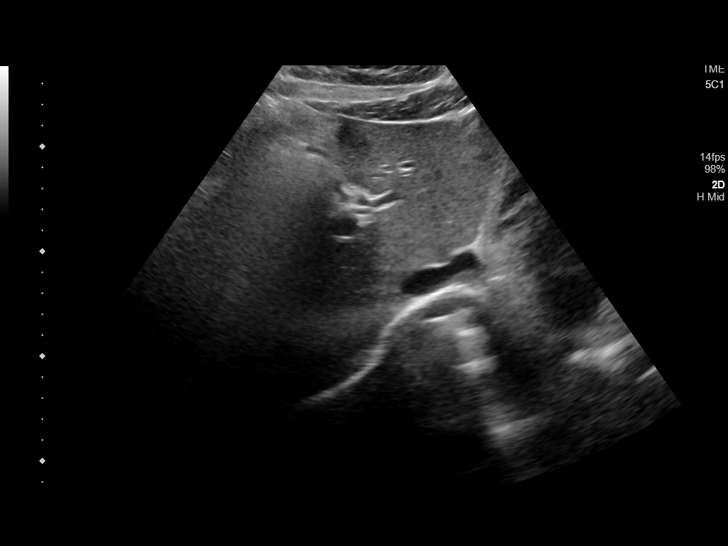
[im 42/56]
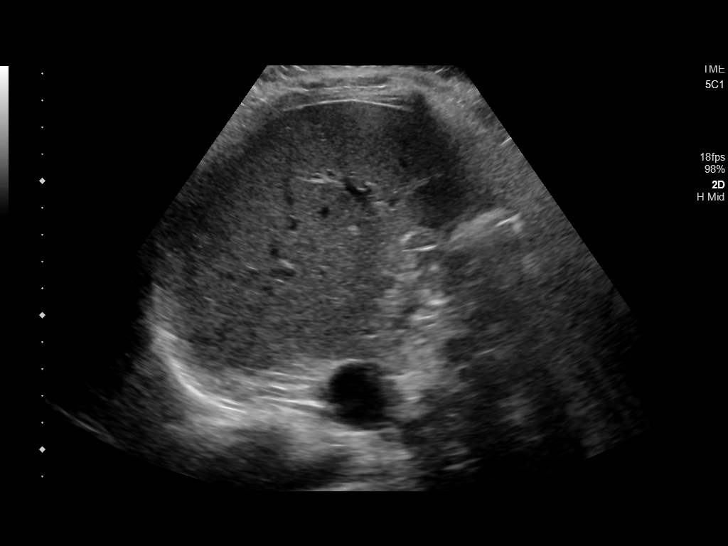
[im 46/56]
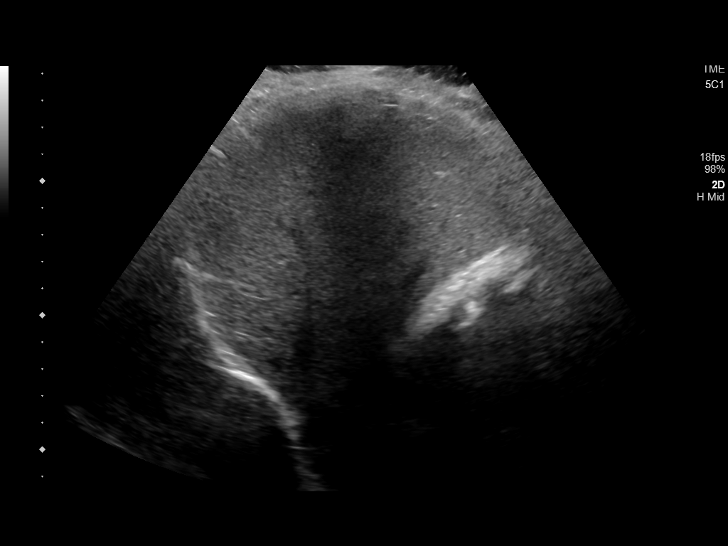
[im 51/56]
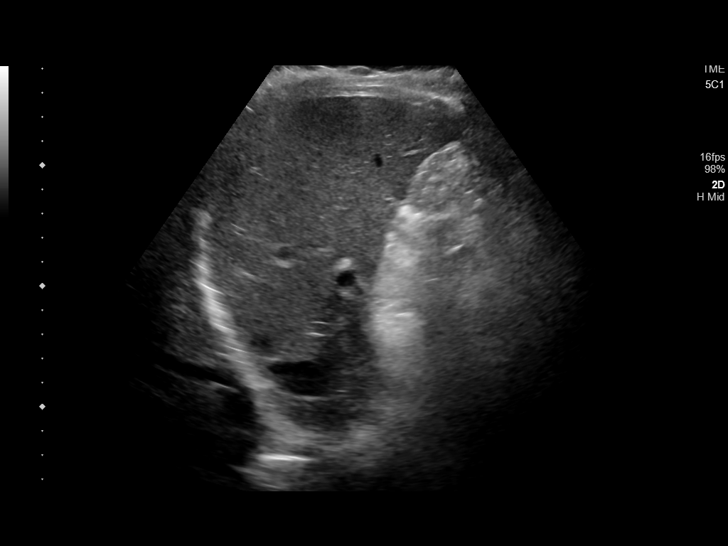
[im 56/56]
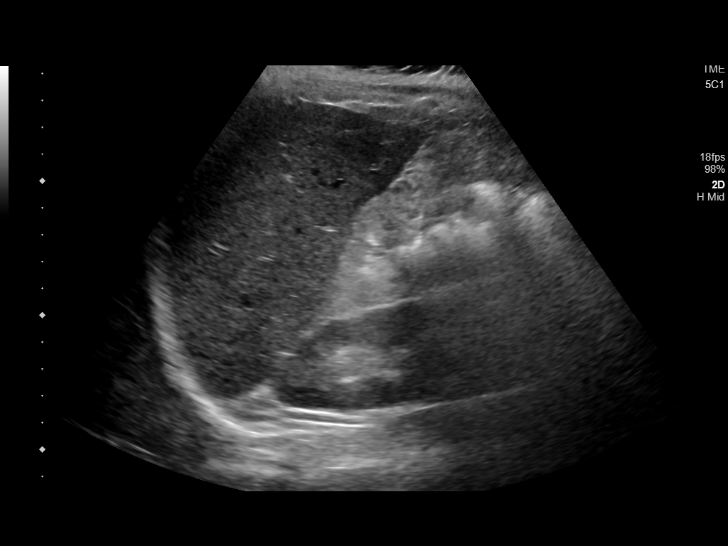

[14 of 25 positions shown; findings below may reference images not displayed]

FINDINGS: Gallbladder:

No normal appearing gallbladder is visualized. At the expected
position of the gallbladder fossa, a densely shadowing structure is
visualized. Assuming the patient has not had a prior
cholecystectomy, this may represent a wall-echo-shadow complex in a
patient with a gallbladder filled with gallstones. No
pericholecystic fluid or sonographic Murphy sign identified.

Common bile duct:

Diameter: 3 mm diameter, normal

Liver:

Normal appearance. No focal hepatic mass or nodularity. Portal vein
is patent on color Doppler imaging with normal direction of blood
flow towards the liver.

No RIGHT upper quadrant free fluid.
IMPRESSION: Presumed wall-echo-shadow complex at the gallbladder fossa likely
representing a gallbladder filled with multiple gallstones;
recommend correlation with patient's surgical history to confirm
that patient has not had a prior cholecystectomy, since a shadowing
bowel loop can potentially cause a similar appearance.

No biliary dilatation identified.

## 2020-10-29 IMAGING — MR MR ABDOMEN WO/W CM MRCP
21 of 23 series · 44 of 48 positions shown · IV contrast (Gadavist)
Comparison: Ultrasound 02/24/2018

CLINICAL DATA: Elevated LFTs and gallstones.

EXAM:
MRI ABDOMEN WITHOUT AND WITH CONTRAST (INCLUDING MRCP)
TECHNIQUE: Multiplanar multisequence MR imaging of the abdomen was performed
both before and after the administration of intravenous contrast.
Heavily T2-weighted images of the biliary and pancreatic ducts were
obtained, and three-dimensional MRCP images were rendered by post
processing.
CONTRAST:  8 cc of Gadavist

[Series 2: bSSFP · coronal · 6.0mm · 0.74mm/px · 1 of 30 slices shown]
[im 1/30]
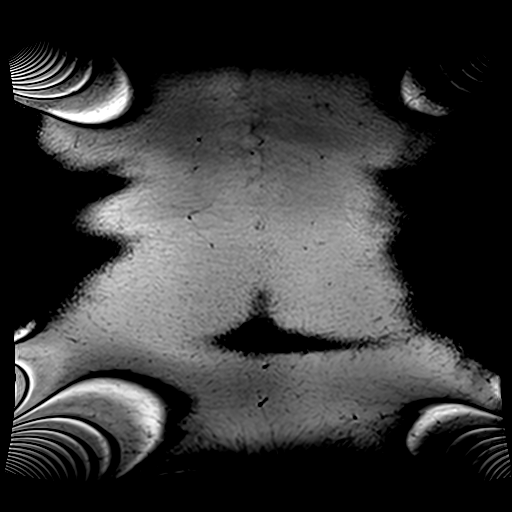

[Series 3: T2 · axial · 6.0mm · 1.19mm/px · 1 of 37 slices shown]
[im 1/37]
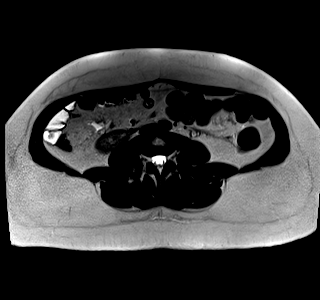

[Series 4: T1 · axial · 6.0mm · 0.74mm/px · 1 of 37 slices shown (1 of 2)]
[im 1/37]
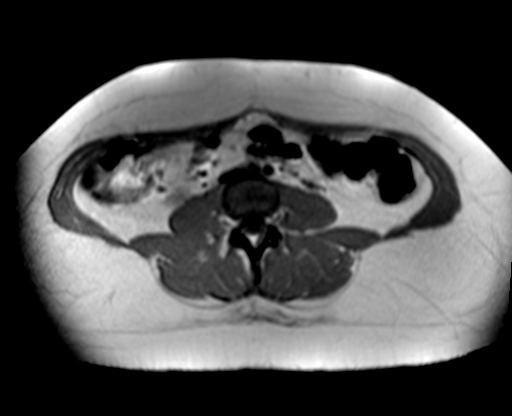

[Series 4: T1 · axial · 6.0mm · 0.74mm/px · 1 of 37 slices shown (2 of 2)]
[im 1/37]
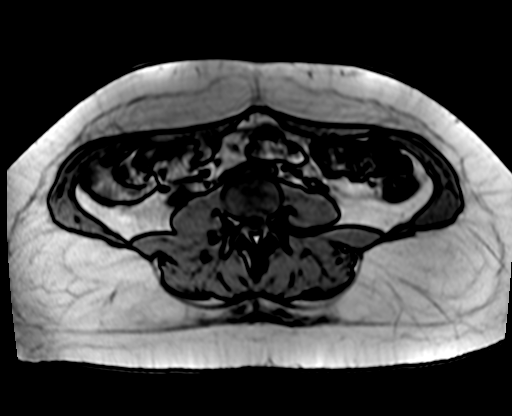

[Series 7: T2 fat-sat · axial · 6.0mm · 1.19mm/px · 1 of 34 slices shown]
[im 1/34]
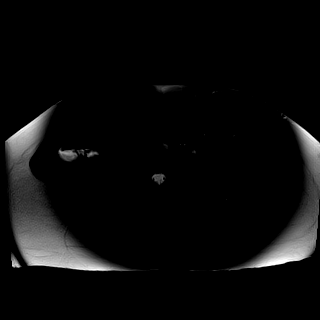

[Series 8: ax dwi_tracew · axial · 6.0mm · 1.49mm/px · 1 of 38 slices shown (1 of 3)]
[im 1/38]
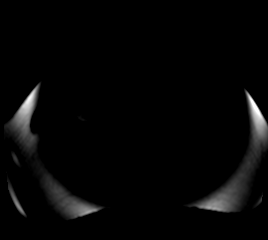

[Series 8: ax dwi_tracew · axial · 6.0mm · 1.49mm/px · z∈[-83,+197]mm · 2 of 38 slices shown (2 of 3)]
[im 1/38]
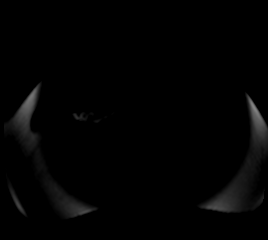
[im 38/38]
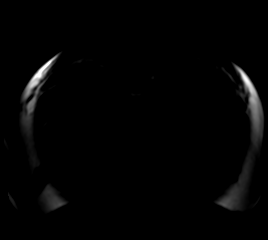

[Series 8: ax dwi_tracew · axial · 6.0mm · 1.49mm/px · z∈[-83,+197]mm · 2 of 38 slices shown (3 of 3)]
[im 1/38]
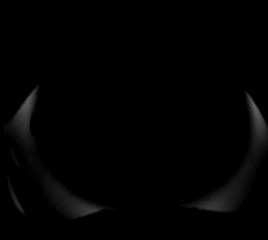
[im 38/38]
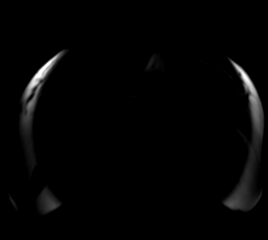

[Series 9: ax dwi_adc · axial · 6.0mm · 1.49mm/px · z∈[-83,+197]mm · 2 of 38 slices shown]
[im 1/38]
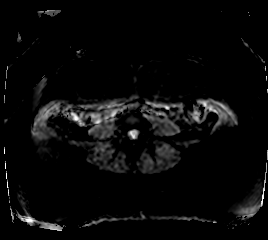
[im 38/38]
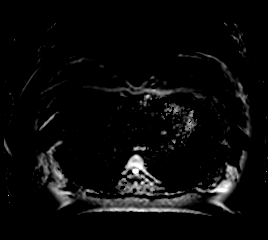

[Series 13: radials · coronal · 50.0mm · 0.83mm/px · 1 of 3 slices shown]
[im 1/3]
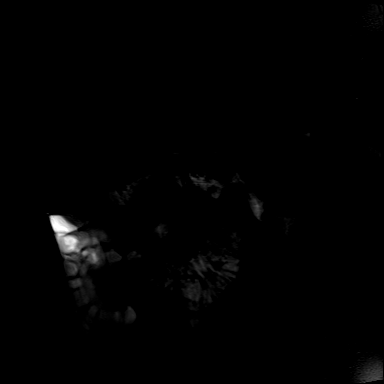

[Series 14: MRCP · coronal · 3.0mm · 1.19mm/px · 1 of 17 slices shown]
[im 1/17]
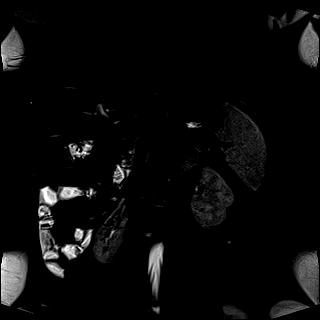

[Series 15: T1 dynamic fat-sat · axial · non-contrast · 3.0mm · 1.19mm/px · z∈[-12,+201]mm · 3 of 72 slices shown (1 of 5)]
[im 1/72]
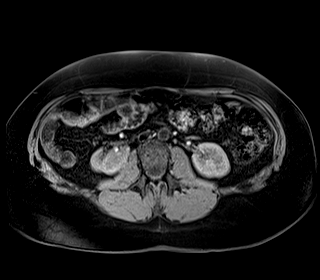
[im 36/72]
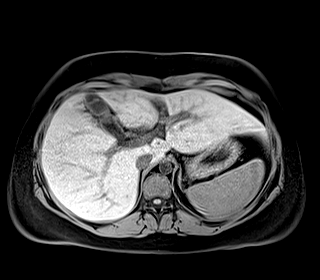
[im 72/72]
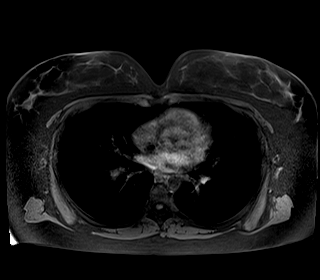

[Series 17: T1 dynamic fat-sat post-contrast · axial · 3.0mm · 1.19mm/px · z∈[-12,+201]mm · 3 of 72 slices shown (1 of 4)]
[im 1/72]
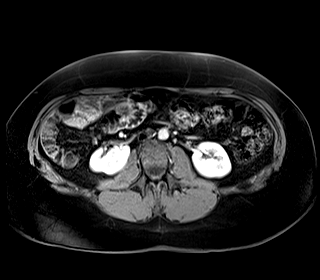
[im 36/72]
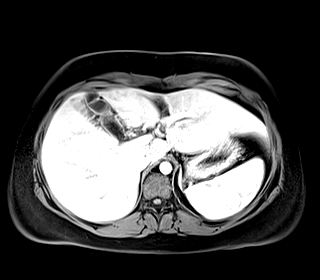
[im 72/72]
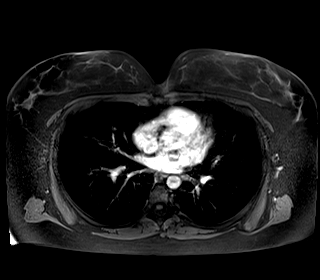

[Series 18: T1 dynamic fat-sat · axial · 3.0mm · 1.19mm/px · z∈[-12,+201]mm · 3 of 72 slices shown (2 of 5)]
[im 1/72]
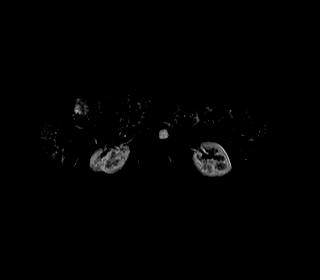
[im 36/72]
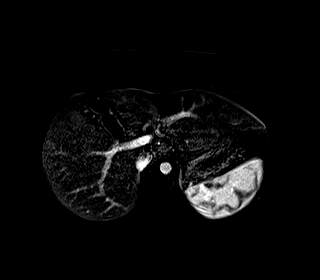
[im 72/72]
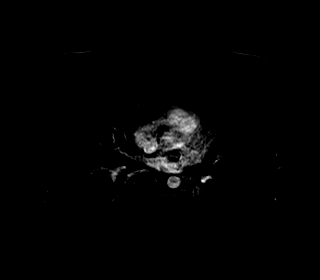

[Series 19: T1 dynamic fat-sat post-contrast · axial · 3.0mm · 1.19mm/px · z∈[-12,+201]mm · 3 of 72 slices shown (2 of 4)]
[im 1/72]
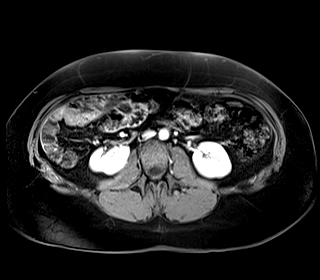
[im 36/72]
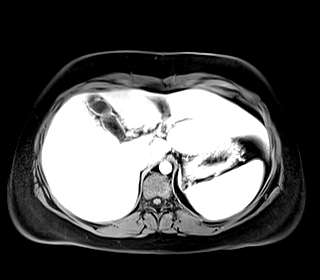
[im 72/72]
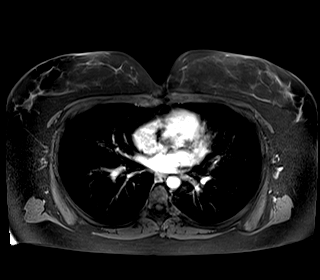

[Series 20: T1 dynamic fat-sat · axial · 3.0mm · 1.19mm/px · z∈[-12,+201]mm · 3 of 72 slices shown (3 of 5)]
[im 1/72]
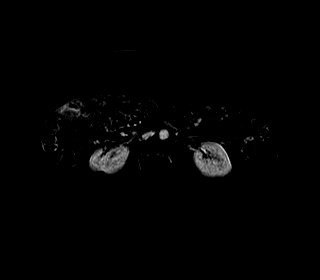
[im 36/72]
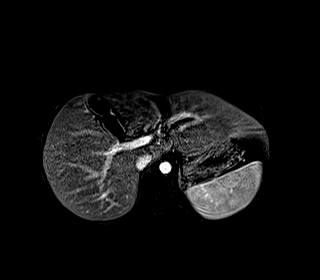
[im 72/72]
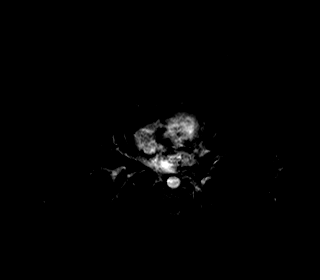

[Series 21: T1 dynamic fat-sat post-contrast · axial · 3.0mm · 1.19mm/px · z∈[-12,+201]mm · 3 of 72 slices shown (3 of 4)]
[im 1/72]
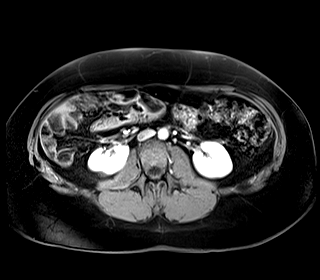
[im 36/72]
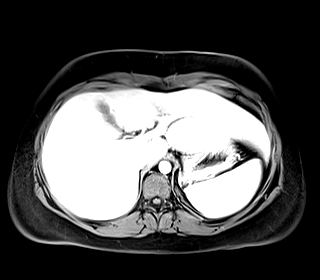
[im 72/72]
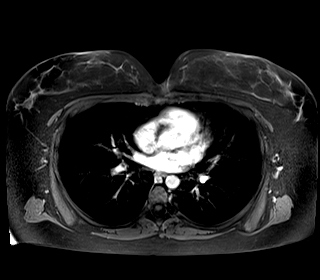

[Series 22: T1 dynamic fat-sat · axial · 3.0mm · 1.19mm/px · z∈[-12,+201]mm · 3 of 72 slices shown (4 of 5)]
[im 1/72]
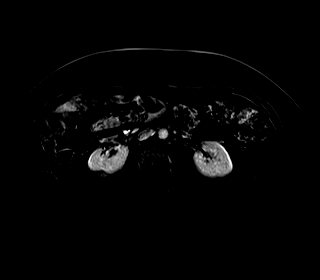
[im 36/72]
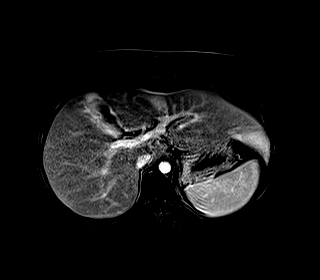
[im 72/72]
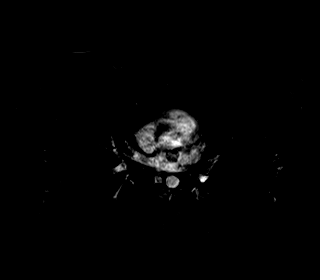

[Series 23: T1 dynamic post-contrast · coronal · 3.0mm · 1.31mm/px · 3 of 64 slices shown]
[im 1/64]
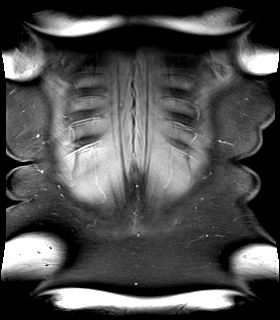
[im 32/64]
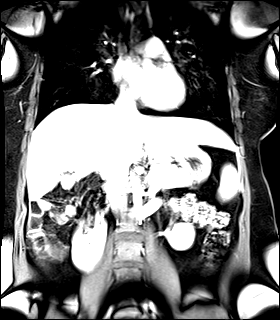
[im 64/64]
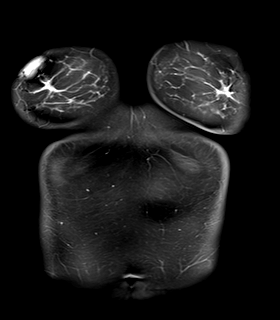

[Series 24: T1 dynamic fat-sat post-contrast · axial · 3.0mm · 1.19mm/px · z∈[-12,+201]mm · 3 of 72 slices shown (4 of 4)]
[im 1/72]
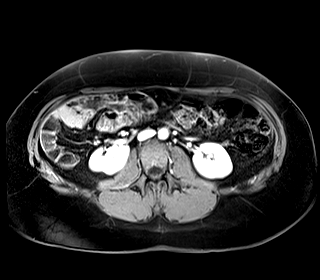
[im 36/72]
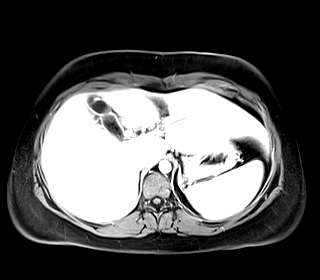
[im 72/72]
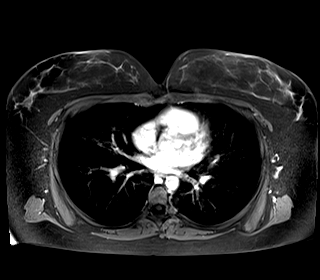

[Series 25: T1 dynamic fat-sat · axial · 3.0mm · 1.19mm/px · z∈[-12,+201]mm · 3 of 72 slices shown (5 of 5)]
[im 1/72]
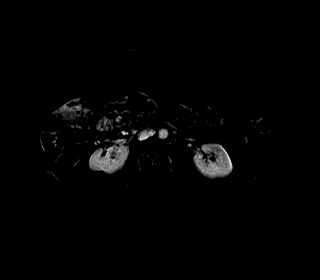
[im 36/72]
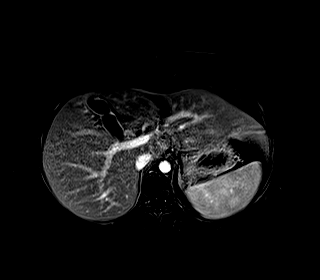
[im 72/72]
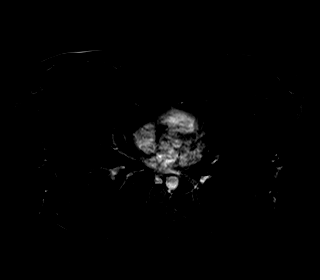

[44 of 48 positions shown; findings below may reference images not displayed]

FINDINGS: Lower chest: No acute findings.

Hepatobiliary: No mass or other parenchymal abnormality identified.
Sludge layering within the gallbladder identified. No definite
gallstones identified. No gallbladder wall thickening or
pericholecystic fluid.

Mild central biliary dilatation. Normal caliber common bile duct
measuring 4 mm in maximum diameter. No choledocholithiasis.

Pancreas: No mass, inflammatory changes, or other parenchymal
abnormality identified.

Spleen:  Within normal limits in size and appearance.

Adrenals/Urinary Tract: Normal adrenal glands. Unremarkable
appearance of the kidneys. No mass or hydronephrosis identified.

Stomach/Bowel: The stomach is unremarkable. There is a malrotation
deformity. The duodenum does not appear to cross the midline with
majority of the small bowel loops in the right hemiabdomen.

Vascular/Lymphatic: No pathologically enlarged lymph nodes
identified. No abdominal aortic aneurysm demonstrated. Portal vein
and hepatic veins appear patent.

Other:  None.

Musculoskeletal: No suspicious bone lesions identified.
IMPRESSION: 1. There is gallbladder sludge without definite gallstones,
gallbladder wall thickening or pericholecystic fluid.
2. Mild central intrahepatic biliary ductal dilatation with a normal
common bile duct. No choledocholithiasis identified.
3. Suspect small bowel malrotation deformity, likely congenital.

## 2020-12-09 ENCOUNTER — Other Ambulatory Visit: Payer: Self-pay

## 2020-12-09 ENCOUNTER — Encounter: Payer: Self-pay | Admitting: Physical Therapy

## 2020-12-09 ENCOUNTER — Ambulatory Visit: Payer: Medicaid Other | Attending: Obstetrics | Admitting: Physical Therapy

## 2020-12-09 DIAGNOSIS — R279 Unspecified lack of coordination: Secondary | ICD-10-CM | POA: Diagnosis present

## 2020-12-09 DIAGNOSIS — M6281 Muscle weakness (generalized): Secondary | ICD-10-CM | POA: Insufficient documentation

## 2020-12-09 DIAGNOSIS — R252 Cramp and spasm: Secondary | ICD-10-CM | POA: Insufficient documentation

## 2020-12-09 NOTE — Patient Instructions (Signed)
[email or web address]   
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
## 2020-12-16 ENCOUNTER — Other Ambulatory Visit: Payer: Self-pay | Admitting: Urology

## 2020-12-16 ENCOUNTER — Other Ambulatory Visit: Payer: Self-pay | Admitting: *Deleted

## 2020-12-16 ENCOUNTER — Telehealth (INDEPENDENT_AMBULATORY_CARE_PROVIDER_SITE_OTHER): Payer: Medicaid Other | Admitting: Urology

## 2020-12-16 ENCOUNTER — Ambulatory Visit: Payer: Medicaid Other | Admitting: Urology

## 2020-12-16 DIAGNOSIS — N201 Calculus of ureter: Secondary | ICD-10-CM | POA: Diagnosis not present

## 2020-12-16 MED ORDER — CEPHALEXIN 500 MG PO CAPS: 500.0000 mg | ORAL_CAPSULE | Freq: Once | ORAL | Status: AC

## 2020-12-16 MED ORDER — ONDANSETRON HCL 4 MG/2ML IJ SOLN: 4.0000 mg | Freq: Once | INTRAMUSCULAR | Status: AC

## 2020-12-16 MED ORDER — SODIUM CHLORIDE 0.9 % IV SOLN: INTRAVENOUS | Status: DC

## 2020-12-16 MED ORDER — DIAZEPAM 5 MG PO TABS: 10.0000 mg | ORAL_TABLET | ORAL | Status: AC

## 2020-12-16 MED ORDER — DIPHENHYDRAMINE HCL 25 MG PO CAPS: 25.0000 mg | ORAL_CAPSULE | ORAL | Status: AC

## 2020-12-16 NOTE — Progress Notes (Signed)
This service is provided via telemedicine   No vital signs collected/recorded due to the encounter was a telemedicine visit. All medications, allergies, pharmacy updated    Patient consents to a telephone visit:  Yes    Names of all persons participating in the telemedicine service and their role in the encounter:  Verlene Mayer, [place]

## 2020-12-16 NOTE — H&P (View-Only) (Signed)
Virtual Visit via Video Note  I connected with Sarah Haney on 12/16/2020 at  2:45 PM EDT by a video enabled telemedicine application and verified that I am speaking with the correct person using two identifiers.  Location: Patient: Home Provider: [place]    I discussed the limitations of evaluation and management by telemedicine and the availability of in person appointments. The patient expressed understanding and agreed to proceed.  History of Present Illness: Sarah Haney is a 25 y.o. female who presents for further evaluation of kidney stones.   She had occurrence of blood in her urine 7 days ago. Her urine was red in color. She saw her PCP, Grayland Ormond, PA-C, who diagnosed her with UTI for which shewas prescribed abx. She has had right sided pain that has been present for awhile.  Urine culture was ultimately negative.   She was seen in the ED at [facility] on 12/12/2020 for evaluation of blood in urine. Her urine showed no signs of infection.CT scan revealed right proximal ureter 0.7 cm calculus. There is mild dilation of the proximal ureter suggesting at least partial obstruction. No pelviectasis or caliectasis.  Stone to scan distant 13.5. hounsfield units is about 500.  Incidentally,She was positive for Covid-19.  She has been having some stomach cramps but otherwise has not had any other symptoms.   She is not on any blood thinners and took oxycodone and NSAID yesterday morning yesterday.    Assessment and Plan: Right obstructing ureteral calculus stone  - CT scan reviewed personally/ labs/ ED and PCP chart  - We discussed various treatment options for urolithiasis including observation with or without medical expulsive therapy, shockwave lithotripsy (SWL), ureteroscopy and laser lithotripsy with stent placement, and percutaneous nephrolithotomy.   We discussed that management is based on stone size, location, density, patient co-morbidities, and  patient preference.    Stones <52mm in size have a >80% spontaneous passage rate. Data surrounding the use of tamsulosin for medical expulsive therapy is controversial, but meta analyses suggests it is most efficacious for distal stones between 5-9mm in size. Possible side effects include dizziness/lightheadedness, and retrograde ejaculation.   SWL has a lower stone free rate in a single procedure, but also a lower complication rate compared to ureteroscopy and avoids a stent and associated stent related symptoms. Possible complications include renal hematoma, steinstrasse, and need for additional treatment. We discussed the role of his increased skin to stone distance can lead to decreased efficacy with shockwave lithotripsy.   Ureteroscopy with laser lithotripsy and stent placement has a higher stone free rate than SWL in a single procedure, however increased complication rate including possible infection, ureteral injury, bleeding, and stent related morbidity. Common stent related symptoms include dysuria, urgency/frequency, and flank pain.   After an extensive discussion of the risks and benefits of the above treatment options, the patient would like to proceed with ESWL.   - Discussed that she should not take Ibuprofen, Advil, Aleve, or any blood thinners.   Follow Up Instructions: Follow-up for ESWL tomorrow   I discussed the assessment and treatment plan with the patient. The patient was provided an opportunity to ask questions and all were answered. The patient agreed with the plan and demonstrated an understanding of the instructions.   The patient was advised to call back or seek an in-person evaluation if the symptoms worsen or if the condition fails to improve as anticipated.  I provided 45 minutes of non-face-to-face time during this encounter.  I,Kailey Littlejohn,acting as a Education administrator for Hollice Espy, MD.,have documented all relevant documentation on the behalf of Hollice Espy, MD,as directed by  Hollice Espy, MD while in the presence of Hollice Espy, MD. I have reviewed the above documentation for accuracy and completeness, and I agree with the above.   Hollice Espy, MD

## 2020-12-16 NOTE — Progress Notes (Signed)
ESWL ORDER FORM  Expected date of procedure: 12/17/20  Surgeon: John Giovanni, MD  Post op standing: 2-4wk follow up w/KUB prior  Anticoagulation/Aspirin/NSAID standing order: Hold all 72 hours prior  Anesthesia standing order: MAC  VTE standing: SCD's  Dx: Right Ureteral Stone  Procedure: right Extracorporeal shock wave lithotripsy  CPT : 67591  Standing Order Set:   *NPO after mn, KUB  *NS 173m/hr, Keflex 5082mPO, Benadryl 2559mO, Valium 49m61m, Zofran 4mg 23m  Medications if other than standing orders:   NONE

## 2020-12-16 NOTE — Progress Notes (Signed)
Virtual Visit via Video Note  I connected with Sarah Haney on 12/16/2020 at  2:45 PM EDT by a video enabled telemedicine application and verified that I am speaking with the correct person using two identifiers.  Location: Patient: Home Provider: [place]    I discussed the limitations of evaluation and management by telemedicine and the availability of in person appointments. The patient expressed understanding and agreed to proceed.  History of Present Illness: Sarah Haney is a 25 y.o. female who presents for further evaluation of kidney stones.   She had occurrence of blood in her urine 7 days ago. Her urine was red in color. She saw her PCP, Grayland Ormond, PA-C, who diagnosed her with UTI for which shewas prescribed abx. She has had right sided pain that has been present for awhile.  Urine culture was ultimately negative.   She was seen in the ED at [facility] on 12/12/2020 for evaluation of blood in urine. Her urine showed no signs of infection.CT scan revealed right proximal ureter 0.7 cm calculus. There is mild dilation of the proximal ureter suggesting at least partial obstruction. No pelviectasis or caliectasis.  Stone to scan distant 13.5. hounsfield units is about 500.  Incidentally,She was positive for Covid-19.  She has been having some stomach cramps but otherwise has not had any other symptoms.   She is not on any blood thinners and took oxycodone and NSAID yesterday morning yesterday.    Assessment and Plan: Right obstructing ureteral calculus stone  - CT scan reviewed personally/ labs/ ED and PCP chart  - We discussed various treatment options for urolithiasis including observation with or without medical expulsive therapy, shockwave lithotripsy (SWL), ureteroscopy and laser lithotripsy with stent placement, and percutaneous nephrolithotomy.   We discussed that management is based on stone size, location, density, patient co-morbidities, and  patient preference.    Stones <53mm in size have a >80% spontaneous passage rate. Data surrounding the use of tamsulosin for medical expulsive therapy is controversial, but meta analyses suggests it is most efficacious for distal stones between 5-35mm in size. Possible side effects include dizziness/lightheadedness, and retrograde ejaculation.   SWL has a lower stone free rate in a single procedure, but also a lower complication rate compared to ureteroscopy and avoids a stent and associated stent related symptoms. Possible complications include renal hematoma, steinstrasse, and need for additional treatment. We discussed the role of his increased skin to stone distance can lead to decreased efficacy with shockwave lithotripsy.   Ureteroscopy with laser lithotripsy and stent placement has a higher stone free rate than SWL in a single procedure, however increased complication rate including possible infection, ureteral injury, bleeding, and stent related morbidity. Common stent related symptoms include dysuria, urgency/frequency, and flank pain.   After an extensive discussion of the risks and benefits of the above treatment options, the patient would like to proceed with ESWL.   - Discussed that she should not take Ibuprofen, Advil, Aleve, or any blood thinners.   Follow Up Instructions: Follow-up for ESWL tomorrow   I discussed the assessment and treatment plan with the patient. The patient was provided an opportunity to ask questions and all were answered. The patient agreed with the plan and demonstrated an understanding of the instructions.   The patient was advised to call back or seek an in-person evaluation if the symptoms worsen or if the condition fails to improve as anticipated.  I provided 45 minutes of non-face-to-face time during this encounter.  I,Kailey Littlejohn,acting as a Education administrator for Hollice Espy, MD.,have documented all relevant documentation on the behalf of Hollice Espy, MD,as directed by  Hollice Espy, MD while in the presence of Hollice Espy, MD. I have reviewed the above documentation for accuracy and completeness, and I agree with the above.   Hollice Espy, MD

## 2020-12-17 ENCOUNTER — Other Ambulatory Visit: Payer: Self-pay

## 2020-12-17 ENCOUNTER — Encounter
Admission: RE | Disposition: A | Discharge status: Home / Self Care | Payer: Self-pay | Source: Home / Self Care | Attending: Urology

## 2020-12-17 ENCOUNTER — Encounter: Payer: Self-pay | Admitting: Urology

## 2020-12-17 ENCOUNTER — Ambulatory Visit: Payer: Medicaid Other

## 2020-12-17 ENCOUNTER — Ambulatory Visit
Admission: RE | Admit: 2020-12-17 | Discharge: 2020-12-17 | Disposition: A | Discharge status: Home / Self Care | Payer: Medicaid Other | Attending: Urology | Admitting: Urology

## 2020-12-17 DIAGNOSIS — Z8616 Personal history of COVID-19: Secondary | ICD-10-CM | POA: Diagnosis not present

## 2020-12-17 DIAGNOSIS — N201 Calculus of ureter: Secondary | ICD-10-CM

## 2020-12-17 HISTORY — PX: EXTRACORPOREAL SHOCK WAVE LITHOTRIPSY: SHX1557

## 2020-12-17 LAB — POCT PREGNANCY, URINE: Preg Test, Ur: NEGATIVE

## 2020-12-17 SURGERY — LITHOTRIPSY, ESWL
Anesthesia: Moderate Sedation | Laterality: Right

## 2020-12-17 MED ORDER — OXYCODONE HCL 5 MG PO TABS: 5.0000 mg | ORAL_TABLET | Freq: Four times a day (QID) | ORAL | 0 refills | Status: DC | PRN

## 2020-12-17 MED ORDER — TAMSULOSIN HCL 0.4 MG PO CAPS: 0.4000 mg | ORAL_CAPSULE | Freq: Every day | ORAL | 0 refills | Status: DC

## 2020-12-17 MED ORDER — DIAZEPAM 5 MG PO TABS
ORAL_TABLET | ORAL | Status: AC
  Administered 2020-12-17: 10 mg via ORAL
  Filled 2020-12-17: qty 2

## 2020-12-17 MED ORDER — CEPHALEXIN 500 MG PO CAPS
ORAL_CAPSULE | ORAL | Status: AC
  Administered 2020-12-17: 500 mg via ORAL
  Filled 2020-12-17: qty 1

## 2020-12-17 MED ORDER — ONDANSETRON HCL 4 MG/2ML IJ SOLN
INTRAMUSCULAR | Status: AC
  Administered 2020-12-17: 4 mg via INTRAVENOUS
  Filled 2020-12-17: qty 2

## 2020-12-17 MED ORDER — DIPHENHYDRAMINE HCL 25 MG PO CAPS
ORAL_CAPSULE | ORAL | Status: AC
  Administered 2020-12-17: 25 mg via ORAL
  Filled 2020-12-17: qty 1

## 2020-12-17 NOTE — Discharge Instructions (Addendum)
As per the [facility] discharge instructions A prescription for oxycodone was sent to your pharmacy to take as needed for pain A prescription for tamsulosin was also sent which will help you pass stone fragments You will be scheduled for a follow-up appointment in approximately 2 weeks Call our office [phone] for pain not controlled with oral medication or fever greater than 101 degrees  AMBULATORY SURGERY  DISCHARGE INSTRUCTIONS   The drugs that you were given will stay in your system until tomorrow so for the next 24 hours you should not:  Drive an automobile Make any legal decisions Drink any alcoholic beverage   You may resume regular meals tomorrow.  Today it is better to start with liquids and gradually work up to solid foods.  You may eat anything you prefer, but it is better to start with liquids, then soup and crackers, and gradually work up to solid foods.   Please notify your doctor immediately if you have any unusual bleeding, trouble breathing, redness and pain at the surgery site, drainage, fever, or pain not relieved by medication.    Additional Instructions:        Please contact your physician with any problems or Same Day Surgery at [phone], Monday through Friday 6 am to 4 pm, or [place] at [facility] number at [phone].

## 2020-12-17 NOTE — Interval H&P Note (Signed)
History and Physical Interval Note:  CV:RRR Lungs: clear  12/17/2020 10:48 AM  Sarah Haney  has presented today for surgery, with the diagnosis of Right obstructing stone.  The various methods of treatment have been discussed with the patient and family. After consideration of risks, benefits and other options for treatment, the patient has consented to  Procedure(s): EXTRACORPOREAL SHOCK WAVE LITHOTRIPSY (ESWL) (Right) as a surgical intervention.  The patient's history has been reviewed, patient examined, no change in status, stable for surgery.  I have reviewed the patient's chart and labs.  Questions were answered to the patient's satisfaction.     Orin

## 2020-12-21 ENCOUNTER — Other Ambulatory Visit: Payer: Self-pay

## 2020-12-21 DIAGNOSIS — N201 Calculus of ureter: Secondary | ICD-10-CM

## 2020-12-22 ENCOUNTER — Ambulatory Visit: Payer: Medicaid Other | Admitting: Physical Therapy
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
## 2020-12-31 ENCOUNTER — Other Ambulatory Visit: Payer: Self-pay

## 2020-12-31 ENCOUNTER — Ambulatory Visit
Admission: RE | Admit: 2020-12-31 | Discharge: 2020-12-31 | Disposition: A | Discharge status: Home / Self Care | Payer: Medicaid Other | Source: Ambulatory Visit | Attending: Urology | Admitting: Urology

## 2020-12-31 ENCOUNTER — Encounter: Payer: Self-pay | Admitting: Urology

## 2020-12-31 ENCOUNTER — Ambulatory Visit (INDEPENDENT_AMBULATORY_CARE_PROVIDER_SITE_OTHER): Payer: Medicaid Other | Admitting: Urology

## 2020-12-31 VITALS — BP 94/66 | HR 91 | Ht 65.0 in | Wt 165.0 lb

## 2020-12-31 DIAGNOSIS — N201 Calculus of ureter: Secondary | ICD-10-CM

## 2020-12-31 DIAGNOSIS — R3129 Other microscopic hematuria: Secondary | ICD-10-CM

## 2021-01-01 LAB — URINALYSIS, COMPLETE
Bilirubin, UA: NEGATIVE
Glucose, UA: NEGATIVE
Ketones, UA: NEGATIVE
Leukocytes,UA: NEGATIVE
Nitrite, UA: NEGATIVE
Protein,UA: NEGATIVE
Specific Gravity, UA: 1.015 (ref 1.005–1.030)
Urobilinogen, Ur: 0.2 mg/dL (ref 0.2–1.0)
pH, UA: 7 (ref 5.0–7.5)

## 2021-01-01 LAB — MICROSCOPIC EXAMINATION

## 2021-01-03 LAB — CULTURE, URINE COMPREHENSIVE

## 2021-01-05 ENCOUNTER — Ambulatory Visit: Payer: Medicaid Other | Attending: Obstetrics | Admitting: Physical Therapy

## 2021-01-05 DIAGNOSIS — M6281 Muscle weakness (generalized): Secondary | ICD-10-CM | POA: Insufficient documentation

## 2021-01-05 DIAGNOSIS — R279 Unspecified lack of coordination: Secondary | ICD-10-CM | POA: Insufficient documentation

## 2021-01-05 DIAGNOSIS — R252 Cramp and spasm: Secondary | ICD-10-CM | POA: Insufficient documentation

## 2021-01-06 ENCOUNTER — Telehealth: Payer: Self-pay | Admitting: Physical Therapy

## 2021-01-06 NOTE — Telephone Encounter (Signed)
PT called pt about morning's appointment at 01/05/2021 10:15 AM. Pt did not answer, and does not have voicemail set up.   Stacy Gardner, PT, DPT 11/16/229:44 AM

## 2021-01-07 ENCOUNTER — Encounter: Payer: Self-pay | Admitting: Urology

## 2021-01-19 ENCOUNTER — Telehealth: Payer: Self-pay | Admitting: Physical Therapy

## 2021-01-19 ENCOUNTER — Ambulatory Visit: Payer: Medicaid Other | Admitting: Physical Therapy

## 2021-01-19 NOTE — Telephone Encounter (Signed)
PT called pt about this morning's appointment at 1015. Pt did not answer, voicemail left. Pt also made aware of clinic's attendance policy in voicemail as this is pt's second no show for an appointment.   Stacy Gardner, PT, DPT 01/19/2209:36 AM

## 2021-02-02 ENCOUNTER — Telehealth: Payer: Self-pay | Admitting: Physical Therapy

## 2021-02-02 ENCOUNTER — Ambulatory Visit: Payer: Medicaid Other | Attending: Obstetrics | Admitting: Physical Therapy

## 2021-02-02 DIAGNOSIS — M6281 Muscle weakness (generalized): Secondary | ICD-10-CM | POA: Insufficient documentation

## 2021-02-02 DIAGNOSIS — R252 Cramp and spasm: Secondary | ICD-10-CM | POA: Insufficient documentation

## 2021-02-02 DIAGNOSIS — R279 Unspecified lack of coordination: Secondary | ICD-10-CM | POA: Insufficient documentation

## 2021-02-02 NOTE — Telephone Encounter (Signed)
PT called pt about this morning's appointment at 1015. Pt did not answer, voicemail left. Unfortunately, this is pt's third missed appointment without cancellation and pt has not returned since evaluation and will be discharged from PT based on clinic's attendance policy.   Stacy Gardner, PT, DPT 02/02/2209:36 AM

## 2021-03-26 ENCOUNTER — Encounter: Payer: Self-pay | Admitting: Physical Therapy

## 2021-03-26 ENCOUNTER — Ambulatory Visit: Payer: Medicaid Other | Attending: Obstetrics | Admitting: Physical Therapy

## 2021-03-26 ENCOUNTER — Other Ambulatory Visit: Payer: Self-pay

## 2021-03-26 DIAGNOSIS — M62838 Other muscle spasm: Secondary | ICD-10-CM | POA: Diagnosis present

## 2021-03-26 DIAGNOSIS — R279 Unspecified lack of coordination: Secondary | ICD-10-CM | POA: Insufficient documentation

## 2021-03-26 DIAGNOSIS — M6281 Muscle weakness (generalized): Secondary | ICD-10-CM | POA: Insufficient documentation

## 2021-03-26 DIAGNOSIS — R293 Abnormal posture: Secondary | ICD-10-CM | POA: Diagnosis present
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
## 2021-04-13 ENCOUNTER — Encounter: Payer: Medicaid Other | Admitting: Physical Therapy

## 2021-04-20 ENCOUNTER — Ambulatory Visit (INDEPENDENT_AMBULATORY_CARE_PROVIDER_SITE_OTHER): Payer: Medicaid Other | Admitting: Cardiovascular Disease

## 2021-04-20 ENCOUNTER — Encounter: Payer: Self-pay | Admitting: Cardiovascular Disease

## 2021-04-20 ENCOUNTER — Other Ambulatory Visit: Payer: Self-pay

## 2021-04-20 ENCOUNTER — Ambulatory Visit (INDEPENDENT_AMBULATORY_CARE_PROVIDER_SITE_OTHER): Payer: Medicaid Other

## 2021-04-20 VITALS — BP 100/60 | HR 117 | Ht 65.0 in | Wt 166.5 lb

## 2021-04-20 DIAGNOSIS — R079 Chest pain, unspecified: Secondary | ICD-10-CM | POA: Diagnosis not present

## 2021-04-20 DIAGNOSIS — R0602 Shortness of breath: Secondary | ICD-10-CM | POA: Diagnosis not present

## 2021-04-20 DIAGNOSIS — R002 Palpitations: Secondary | ICD-10-CM

## 2021-04-20 DIAGNOSIS — I479 Paroxysmal tachycardia, unspecified: Secondary | ICD-10-CM

## 2021-04-20 NOTE — Patient Instructions (Addendum)
Medication Instructions:  No changes  If you need a refill on your cardiac medications before your next appointment, please call your pharmacy.   Lab work: No new labs needed  Testing/Procedures:  1) Your physician has requested that you have an echocardiogram. Echocardiography is a painless test that uses sound waves to create images of your heart. It provides your doctor with information about the size and shape of your heart and how well your hearts chambers and valves are working. This procedure takes approximately one hour. There are no restrictions for this procedure.  2) Your physician has recommended that you wear a Zio XT monitor for TWO WEEKS.     This will be mailed to you.    This monitor is a medical device that records the hearts electrical activity. Doctors most often use these monitors to diagnose arrhythmias. Arrhythmias are problems with the speed or rhythm of the heartbeat. The monitor is a small device applied to your chest. You can wear one while you do your normal daily activities. While wearing this monitor if you have any symptoms to push the button and record what you felt. Once you have worn this monitor for the period of time provider prescribed (Usually 14 days), you will return the monitor device in the postage paid box. Once it is returned they will download the data collected and provide [place] with a report which the provider will then review and we will call you with those results. Important tips:  Avoid showering during the first 24 hours of wearing the monitor. Avoid excessive sweating to help maximize wear time. Do not submerge the device, no hot tubs, and no swimming pools. Keep any lotions or oils away from the patch. After 24 hours you may shower with the patch on. Take brief showers with your back facing the shower head.  Do not remove patch once it has been placed because that will interrupt data and decrease adhesive wear time. Push the button when you  have any symptoms and write down what you were feeling. Once you have completed wearing your monitor, remove and place into box which has postage paid and place in your outgoing mailbox.  If for some reason you have misplaced your box then call our office and we can provide another box and/or mail it off for you.   Follow-Up: At [facility], you and your health needs are our priority.  As part of our continuing mission to provide you with exceptional heart care, we have created designated Provider Care Teams.  These Care Teams include your primary Cardiologist (physician) and Advanced Practice Providers (APPs -  Physician Assistants and Nurse Practitioners) who all work together to provide you with the care you need, when you need it.  You will need a follow up appointment as needed  Providers on your designated Care Team:   Murray Hodgkins, NP Christell Faith, PA-C Cadence Kathlen Mody, [place]  COVID-19 Vaccine Information can be found at: [email or web address] For questions related to vaccine distribution or appointments, please email [email or web address] or call (609)[phone].
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
## 2021-04-25 DIAGNOSIS — R002 Palpitations: Secondary | ICD-10-CM | POA: Diagnosis not present

## 2021-04-25 DIAGNOSIS — I479 Paroxysmal tachycardia, unspecified: Secondary | ICD-10-CM

## 2021-04-27 ENCOUNTER — Encounter: Payer: Self-pay | Admitting: Physical Therapy

## 2021-05-05 ENCOUNTER — Other Ambulatory Visit: Payer: Self-pay | Admitting: Cardiovascular Disease

## 2021-05-05 DIAGNOSIS — R079 Chest pain, unspecified: Secondary | ICD-10-CM

## 2021-05-05 DIAGNOSIS — R0602 Shortness of breath: Secondary | ICD-10-CM

## 2021-05-05 DIAGNOSIS — I479 Paroxysmal tachycardia, unspecified: Secondary | ICD-10-CM

## 2021-05-11 ENCOUNTER — Encounter: Payer: Self-pay | Admitting: Physical Therapy

## 2021-05-11 ENCOUNTER — Telehealth: Payer: Self-pay | Admitting: Physical Therapy

## 2021-05-11 NOTE — Telephone Encounter (Signed)
PT called pt about this morning's missed appointment at 1015. Pt did not answer, voicemail left.  ? ?Stacy Gardner, PT, DPT ?05/11/2308:44 AM  ? ?

## 2021-05-14 ENCOUNTER — Other Ambulatory Visit: Payer: Self-pay

## 2021-05-14 ENCOUNTER — Ambulatory Visit (INDEPENDENT_AMBULATORY_CARE_PROVIDER_SITE_OTHER): Payer: Medicaid Other

## 2021-05-14 DIAGNOSIS — R079 Chest pain, unspecified: Secondary | ICD-10-CM

## 2021-05-14 DIAGNOSIS — I479 Paroxysmal tachycardia, unspecified: Secondary | ICD-10-CM

## 2021-05-14 DIAGNOSIS — R0602 Shortness of breath: Secondary | ICD-10-CM

## 2021-05-14 LAB — ECHOCARDIOGRAM COMPLETE
AR max vel: 2.19 cm2
AV Area VTI: 2.54 cm2
AV Area mean vel: 2.24 cm2
AV Mean grad: 2 mmHg
AV Peak grad: 4.6 mmHg
Ao pk vel: 1.07 m/s
Area-P 1/2: 4.21 cm2
Calc EF: 51.3 %
S' Lateral: 3 cm
Single Plane A2C EF: 49.8 %
Single Plane A4C EF: 52 %

## 2021-05-25 ENCOUNTER — Encounter: Payer: Self-pay | Admitting: Physical Therapy

## 2021-05-25 ENCOUNTER — Ambulatory Visit: Payer: Medicaid Other | Attending: Obstetrics | Admitting: Physical Therapy

## 2021-05-25 DIAGNOSIS — M6281 Muscle weakness (generalized): Secondary | ICD-10-CM | POA: Insufficient documentation

## 2021-05-25 DIAGNOSIS — R293 Abnormal posture: Secondary | ICD-10-CM | POA: Insufficient documentation

## 2021-05-25 DIAGNOSIS — M62838 Other muscle spasm: Secondary | ICD-10-CM | POA: Insufficient documentation

## 2021-05-25 DIAGNOSIS — R279 Unspecified lack of coordination: Secondary | ICD-10-CM | POA: Insufficient documentation
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
## 2021-06-08 ENCOUNTER — Ambulatory Visit: Payer: Medicaid Other | Admitting: Physical Therapy

## 2021-06-21 ENCOUNTER — Ambulatory Visit: Payer: Medicaid Other | Admitting: Medical

## 2021-06-21 ENCOUNTER — Telehealth: Payer: Self-pay | Admitting: Cardiovascular Disease

## 2021-06-21 NOTE — Telephone Encounter (Signed)
?  Per Mychart :  ? ? ?[place]  Patient Appointment Schedule Request Pool 14 hours ago (6:22 PM)  ? ?Appointment Request From: Sarah Haney ?  ?With Provider: Ida Rogue, MD [place] ?  ?Preferred Date Range: 07/06/2021 - 07/13/2021 ?  ?Preferred Times: Any Time ?  ?Reason for visit: Office Visit ?  ?Comments: ?Chest pain and results  ?

## 2021-06-21 NOTE — Telephone Encounter (Signed)
Attempted to contact patient via phone .  No ans no vm available.  ? ?Will schedule same day [place] at 245 pm  and relay via [place]. ?

## 2021-06-21 NOTE — Telephone Encounter (Signed)
Patient declined same day due to work.  Scheduled 5/5 at 10 [place] ?

## 2021-06-22 ENCOUNTER — Ambulatory Visit: Payer: Medicaid Other | Attending: Obstetrics | Admitting: Physical Therapy

## 2021-06-22 DIAGNOSIS — M6281 Muscle weakness (generalized): Secondary | ICD-10-CM | POA: Insufficient documentation

## 2021-06-22 DIAGNOSIS — R279 Unspecified lack of coordination: Secondary | ICD-10-CM | POA: Insufficient documentation

## 2021-06-22 DIAGNOSIS — R293 Abnormal posture: Secondary | ICD-10-CM | POA: Insufficient documentation

## 2021-06-22 DIAGNOSIS — M62838 Other muscle spasm: Secondary | ICD-10-CM | POA: Insufficient documentation

## 2021-06-25 ENCOUNTER — Ambulatory Visit: Payer: Medicaid Other | Admitting: Medical
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
## 2021-06-28 ENCOUNTER — Encounter: Payer: Self-pay | Admitting: Medical

## 2021-07-06 ENCOUNTER — Telehealth: Payer: Self-pay | Admitting: Physical Therapy

## 2021-07-06 ENCOUNTER — Encounter: Payer: Self-pay | Admitting: Physical Therapy

## 2021-07-06 NOTE — Telephone Encounter (Signed)
PT called pt about this morning's appointment at 1100. Pt did not answer, and unable to leave a voicemail.  ? ?Clinic does a have cancellation/no show policy and if pt thinks she will be unable to make it to appointment, please call to cancel at least 24 hours in advance. Otherwise, if pt is unable to make it to next appointment without calling, unfortunately she will be discharged from physical therapy as she has already missed two appointments. ? ?Thank you, ?Stacy Gardner, PT, DPT ?07/07/2310:24 PM   ?

## 2021-07-20 ENCOUNTER — Ambulatory Visit: Payer: Medicaid Other | Admitting: Physical Therapy

## 2021-07-21 ENCOUNTER — Telehealth: Payer: Self-pay | Admitting: Physical Therapy

## 2021-07-21 NOTE — Telephone Encounter (Signed)
PT called pt about missing yesterday's appointment at 1100. Pt did not answer, voicemail left.  Unfortunately due to clinic's attendance policy pt will have to be discharged from PT.   Stacy Gardner, PT, DPT 07/21/2309:23 AM

## 2022-02-03 ENCOUNTER — Encounter: Payer: Self-pay | Admitting: Infectious Diseases

## 2022-02-03 ENCOUNTER — Ambulatory Visit: Payer: Medicaid Other | Attending: Infectious Diseases | Admitting: Infectious Diseases

## 2022-02-03 ENCOUNTER — Other Ambulatory Visit
Admission: RE | Admit: 2022-02-03 | Discharge: 2022-02-03 | Disposition: A | Discharge status: Home / Self Care | Payer: Medicaid Other | Source: Ambulatory Visit | Attending: Infectious Diseases | Admitting: Infectious Diseases

## 2022-02-03 ENCOUNTER — Ambulatory Visit
Admission: RE | Admit: 2022-02-03 | Discharge: 2022-02-03 | Disposition: A | Discharge status: Home / Self Care | Payer: Medicaid Other | Source: Ambulatory Visit | Attending: Infectious Diseases | Admitting: Infectious Diseases

## 2022-02-03 VITALS — BP 122/79 | HR 105 | Temp 98.1°F | Ht 65.0 in | Wt 155.0 lb

## 2022-02-03 DIAGNOSIS — Z79899 Other long term (current) drug therapy: Secondary | ICD-10-CM | POA: Insufficient documentation

## 2022-02-03 DIAGNOSIS — R7612 Nonspecific reaction to cell mediated immunity measurement of gamma interferon antigen response without active tuberculosis: Secondary | ICD-10-CM | POA: Insufficient documentation

## 2022-02-03 DIAGNOSIS — L409 Psoriasis, unspecified: Secondary | ICD-10-CM | POA: Insufficient documentation

## 2022-02-03 DIAGNOSIS — R102 Pelvic and perineal pain: Secondary | ICD-10-CM | POA: Insufficient documentation

## 2022-02-03 DIAGNOSIS — F419 Anxiety disorder, unspecified: Secondary | ICD-10-CM | POA: Diagnosis not present

## 2022-02-03 LAB — HEPATIC FUNCTION PANEL
ALT: 39 U/L (ref 0–44)
AST: 31 U/L (ref 15–41)
Albumin: 4.2 g/dL (ref 3.5–5.0)
Alkaline Phosphatase: 83 U/L (ref 38–126)
Bilirubin, Direct: <0.1 mg/dL (ref 0.0–0.2)
Total Bilirubin: 0.5 mg/dL (ref 0.3–1.2)
Total Protein: 7.2 g/dL (ref 6.5–8.1)

## 2022-02-03 LAB — HIV ANTIBODY (ROUTINE TESTING W REFLEX): HIV Screen 4th Generation wRfx: NONREACTIVE

## 2022-02-03 NOTE — Patient Instructions (Signed)
You are referred to me for positive quantiferon gold- you will be starting methotrexate for psoriasis in the future- we will check CXR . 

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
## 2022-02-09 LAB — QUANTIFERON-TB GOLD PLUS (RQFGPL)
QuantiFERON Mitogen Value: >10 IU/mL
QuantiFERON Nil Value: 0 IU/mL
QuantiFERON TB1 Ag Value: 0 IU/mL
QuantiFERON TB2 Ag Value: 0 IU/mL

## 2022-02-09 LAB — QUANTIFERON-TB GOLD PLUS: QuantiFERON-TB Gold Plus: NEGATIVE

## 2022-02-10 ENCOUNTER — Ambulatory Visit: Payer: Medicaid Other | Attending: Infectious Diseases | Admitting: Infectious Diseases

## 2022-02-10 DIAGNOSIS — F419 Anxiety disorder, unspecified: Secondary | ICD-10-CM | POA: Diagnosis present

## 2022-02-10 DIAGNOSIS — L409 Psoriasis, unspecified: Secondary | ICD-10-CM | POA: Diagnosis not present

## 2022-02-10 DIAGNOSIS — R102 Pelvic and perineal pain: Secondary | ICD-10-CM | POA: Insufficient documentation

## 2022-02-10 DIAGNOSIS — R7612 Nonspecific reaction to cell mediated immunity measurement of gamma interferon antigen response without active tuberculosis: Secondary | ICD-10-CM

## 2022-02-10 NOTE — Progress Notes (Signed)
NAME: Sarah Haney  DOB: 1995-07-17  MRN: [id number]  Date/Time: 02/10/2022 8:46 AM  Subjective:   ?The purpose of this virtual visit is to provide medical care while limiting exposure to the novel coronavirus (COVID19) for both patient and office staff.   Consent was obtained for phone visit:  Yes.   Answered questions that patient had about telehealth interaction:  Yes.   I discussed the limitations, risks, security and privacy concerns of performing an evaluation and management service by telephone. I also discussed with the patient that there may be a patient responsible charge related to this service. The patient expressed understanding and agreed to proceed.   Patient Location: Home Provider Location:office  Follow up telephone visit to discuss labs  Sarah Haney is a 26 y.o. 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 Plan was to start her on TNFi and a spart of that they worked her up for infections- HEPC/HEPB neg Quantiferon gold was positive. Pt has no known TB exposure- She used to work in [employer] many years ago and since this year ,after being home for the past few years with her children, she is working in [employer]. She has unexplained pelvic pain , anxiety She is on Nuva ring, seroquel and lamictal  Repeat quantiferon gold from 02/04/20 is negative 11/24/21 lab processed at [facility] was positive  CXR 02/03/22 negative  Informed patient of the discrepant results Will do PPD- Jan 9th  Will decide after that Will also discuss with [facility] where both tests were processed. Total time spent on this telephone visit 7 minutes

## 2022-06-29 ENCOUNTER — Other Ambulatory Visit: Payer: Self-pay | Admitting: Family Medicine

## 2022-06-29 DIAGNOSIS — G8929 Other chronic pain: Secondary | ICD-10-CM

## 2022-06-29 DIAGNOSIS — R630 Anorexia: Secondary | ICD-10-CM

## 2022-06-29 DIAGNOSIS — R634 Abnormal weight loss: Secondary | ICD-10-CM

## 2022-07-05 ENCOUNTER — Ambulatory Visit
Admission: RE | Admit: 2022-07-05 | Discharge: 2022-07-05 | Disposition: A | Discharge status: Home / Self Care | Payer: Medicaid Other | Source: Ambulatory Visit | Attending: Family Medicine | Admitting: Family Medicine

## 2022-07-05 DIAGNOSIS — R630 Anorexia: Secondary | ICD-10-CM

## 2022-07-05 DIAGNOSIS — R634 Abnormal weight loss: Secondary | ICD-10-CM

## 2022-07-05 DIAGNOSIS — G8929 Other chronic pain: Secondary | ICD-10-CM

## 2022-07-05 MED ORDER — IOPAMIDOL (ISOVUE-300) INJECTION 61%
100.0000 mL | Freq: Once | INTRAVENOUS | Status: AC | PRN
  Administered 2022-07-05: 100 mL via INTRAVENOUS

## 2022-07-26 DIAGNOSIS — R002 Palpitations: Secondary | ICD-10-CM | POA: Diagnosis not present

## 2022-08-24 DIAGNOSIS — R002 Palpitations: Secondary | ICD-10-CM | POA: Diagnosis not present

## 2022-08-24 DIAGNOSIS — R000 Tachycardia, unspecified: Secondary | ICD-10-CM | POA: Diagnosis not present

## 2022-09-16 DIAGNOSIS — R634 Abnormal weight loss: Secondary | ICD-10-CM | POA: Diagnosis not present

## 2022-09-16 DIAGNOSIS — E538 Deficiency of other specified B group vitamins: Secondary | ICD-10-CM | POA: Diagnosis not present

## 2022-09-16 DIAGNOSIS — R110 Nausea: Secondary | ICD-10-CM | POA: Diagnosis not present

## 2022-09-16 DIAGNOSIS — D509 Iron deficiency anemia, unspecified: Secondary | ICD-10-CM | POA: Diagnosis not present

## 2022-09-22 DIAGNOSIS — J019 Acute sinusitis, unspecified: Secondary | ICD-10-CM | POA: Diagnosis not present

## 2022-10-19 ENCOUNTER — Ambulatory Visit: Payer: Medicaid Other

## 2022-10-19 DIAGNOSIS — D128 Benign neoplasm of rectum: Secondary | ICD-10-CM | POA: Diagnosis not present

## 2022-10-19 DIAGNOSIS — R197 Diarrhea, unspecified: Secondary | ICD-10-CM | POA: Diagnosis not present

## 2022-10-19 DIAGNOSIS — R110 Nausea: Secondary | ICD-10-CM | POA: Diagnosis not present

## 2022-10-19 DIAGNOSIS — K2950 Unspecified chronic gastritis without bleeding: Secondary | ICD-10-CM | POA: Diagnosis not present

## 2022-10-19 DIAGNOSIS — D509 Iron deficiency anemia, unspecified: Secondary | ICD-10-CM | POA: Diagnosis not present

## 2022-10-19 DIAGNOSIS — R634 Abnormal weight loss: Secondary | ICD-10-CM | POA: Diagnosis not present

## 2022-10-19 DIAGNOSIS — K5900 Constipation, unspecified: Secondary | ICD-10-CM | POA: Diagnosis not present

## 2022-10-19 DIAGNOSIS — K640 First degree hemorrhoids: Secondary | ICD-10-CM | POA: Diagnosis not present

## 2022-10-19 DIAGNOSIS — K2970 Gastritis, unspecified, without bleeding: Secondary | ICD-10-CM | POA: Diagnosis not present

## 2022-10-19 DIAGNOSIS — K621 Rectal polyp: Secondary | ICD-10-CM | POA: Diagnosis not present

## 2022-10-27 ENCOUNTER — Telehealth: Payer: Self-pay

## 2022-10-27 NOTE — Telephone Encounter (Signed)
Patient called office regarding Tb screening. States while she was following Dr. Rivka Safer she was informed one test was negative and the other was positive.  Would like clarification on TB test and if additional testing is needed. Sarah Haney, RMA

## 2022-11-03 DIAGNOSIS — J019 Acute sinusitis, unspecified: Secondary | ICD-10-CM | POA: Diagnosis not present

## 2022-11-08 DIAGNOSIS — R55 Syncope and collapse: Secondary | ICD-10-CM | POA: Diagnosis not present

## 2022-11-08 DIAGNOSIS — T50905A Adverse effect of unspecified drugs, medicaments and biological substances, initial encounter: Secondary | ICD-10-CM | POA: Diagnosis not present

## 2022-11-08 DIAGNOSIS — J019 Acute sinusitis, unspecified: Secondary | ICD-10-CM | POA: Diagnosis not present

## 2022-11-25 DIAGNOSIS — Q433 Congenital malformations of intestinal fixation: Secondary | ICD-10-CM | POA: Diagnosis not present

## 2022-11-25 DIAGNOSIS — K219 Gastro-esophageal reflux disease without esophagitis: Secondary | ICD-10-CM | POA: Diagnosis not present

## 2022-11-25 DIAGNOSIS — D509 Iron deficiency anemia, unspecified: Secondary | ICD-10-CM | POA: Diagnosis not present

## 2022-11-25 DIAGNOSIS — K5909 Other constipation: Secondary | ICD-10-CM | POA: Diagnosis not present

## 2022-12-02 DIAGNOSIS — N898 Other specified noninflammatory disorders of vagina: Secondary | ICD-10-CM | POA: Diagnosis not present

## 2022-12-27 ENCOUNTER — Telehealth: Payer: Self-pay

## 2022-12-27 NOTE — Telephone Encounter (Addendum)
Per Dr. Rivka Safer note patient was supposed to have PPD done Jan 9th. Patient will go to health department to have skin test done and have results sent to provider. Will schedule follow up.  Patient will call office if not able to have skin  test done at [facility].  Juanita Laster, RMA

## 2022-12-27 NOTE — Telephone Encounter (Signed)
Patient called requesting to schedule a TB skin test. Stated that she called a few weeks ago and did not receive clarification. (10/27/2022 phone note)  Are we able to schedule testing here?   Please advise - She has been seeing Dr. Rivka Safer.

## 2023-01-05 DIAGNOSIS — F411 Generalized anxiety disorder: Secondary | ICD-10-CM | POA: Diagnosis not present

## 2023-01-05 DIAGNOSIS — F331 Major depressive disorder, recurrent, moderate: Secondary | ICD-10-CM | POA: Diagnosis not present

## 2023-01-31 ENCOUNTER — Other Ambulatory Visit
Admission: RE | Admit: 2023-01-31 | Discharge: 2023-01-31 | Disposition: A | Discharge status: Home / Self Care | Payer: 59 | Source: Ambulatory Visit | Attending: Infectious Diseases | Admitting: Infectious Diseases

## 2023-01-31 ENCOUNTER — Encounter: Payer: Self-pay | Admitting: Infectious Diseases

## 2023-01-31 ENCOUNTER — Ambulatory Visit: Payer: 59 | Attending: Infectious Diseases | Admitting: Infectious Diseases

## 2023-01-31 VITALS — BP 113/77 | HR 68 | Temp 97.3°F | Ht 65.0 in | Wt 148.0 lb

## 2023-01-31 DIAGNOSIS — F419 Anxiety disorder, unspecified: Secondary | ICD-10-CM | POA: Diagnosis not present

## 2023-01-31 DIAGNOSIS — L409 Psoriasis, unspecified: Secondary | ICD-10-CM | POA: Insufficient documentation

## 2023-01-31 DIAGNOSIS — M2669 Other specified disorders of temporomandibular joint: Secondary | ICD-10-CM | POA: Diagnosis not present

## 2023-01-31 DIAGNOSIS — R7612 Nonspecific reaction to cell mediated immunity measurement of gamma interferon antigen response without active tuberculosis: Secondary | ICD-10-CM | POA: Insufficient documentation

## 2023-01-31 DIAGNOSIS — R102 Pelvic and perineal pain: Secondary | ICD-10-CM | POA: Diagnosis not present

## 2023-01-31 DIAGNOSIS — Z79899 Other long term (current) drug therapy: Secondary | ICD-10-CM | POA: Diagnosis not present

## 2023-01-31 NOTE — Patient Instructions (Signed)
Today we are doing quantiferon gold and PPD and will decide on
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
## 2023-02-02 ENCOUNTER — Ambulatory Visit: Payer: 59 | Attending: Infectious Diseases

## 2023-02-02 DIAGNOSIS — R7612 Nonspecific reaction to cell mediated immunity measurement of gamma interferon antigen response without active tuberculosis: Secondary | ICD-10-CM

## 2023-02-02 LAB — QUANTIFERON-TB GOLD PLUS (RQFGPL)
QuantiFERON Mitogen Value: >10 [IU]/mL
QuantiFERON Nil Value: 0.01 [IU]/mL
QuantiFERON TB1 Ag Value: 0.01 [IU]/mL
QuantiFERON TB2 Ag Value: 0.02 [IU]/mL

## 2023-02-02 LAB — TB SKIN TEST
Induration: 0 mm
TB Skin Test: NEGATIVE

## 2023-02-02 LAB — QUANTIFERON-TB GOLD PLUS: QuantiFERON-TB Gold Plus: NEGATIVE

## 2023-02-02 NOTE — Progress Notes (Signed)
   Patient in office today to have her PPD test read.  Test negative 0.0 induration.  Landri Dorsainvil Jonathon Resides, CMA

## 2023-02-06 ENCOUNTER — Telehealth: Payer: Self-pay

## 2023-02-06 NOTE — Telephone Encounter (Signed)
-----   Message from Lynn Ito sent at 02/03/2023  3:03 PM EST ----- Please let her know the blood test quantiferon gold was also negative- I will send a community message to her rheumatologist ----- Message ----- From: Leory Plowman, Lab In [place] Sent: 02/02/2023  10:35 PM EST To: Lynn Ito, MD

## 2023-02-06 NOTE — Telephone Encounter (Signed)
Patient informed of lab results and verbalized understanding. Sarah Haney, CMA

## 2023-02-14 DIAGNOSIS — L400 Psoriasis vulgaris: Secondary | ICD-10-CM | POA: Diagnosis not present

## 2023-02-14 DIAGNOSIS — L608 Other nail disorders: Secondary | ICD-10-CM | POA: Diagnosis not present

## 2023-02-14 DIAGNOSIS — M2550 Pain in unspecified joint: Secondary | ICD-10-CM | POA: Diagnosis not present

## 2023-02-21 DIAGNOSIS — R109 Unspecified abdominal pain: Secondary | ICD-10-CM | POA: Diagnosis not present

## 2023-02-21 DIAGNOSIS — N390 Urinary tract infection, site not specified: Secondary | ICD-10-CM | POA: Diagnosis not present

## 2023-02-21 DIAGNOSIS — R350 Frequency of micturition: Secondary | ICD-10-CM | POA: Diagnosis not present

## 2023-02-22 NOTE — L&D Delivery Note (Signed)
 Delivery Note  Date of delivery: 02/01/2024 Estimated Date of Delivery: 02/22/24 Patient's last menstrual period was 05/18/2023. EGA: [redacted]w[redacted]d  Delivery Note At 7:39 PM a viable female was delivered via Vaginal, Spontaneous (Presentation: Left Occiput Anterior).  APGAR: 9, 9; weight pending.  Placenta status: Spontaneous, Intact.  Cord: 3 vessels with the following complications: None.  Cord pH: n/a  First Stage: Labor onset: 1325 Induction : Cytotec , Pitocin  and AROM Analgesia /Anesthesia intrapartum: Epidural  AROM at [facility] presented to L&D for IOL. She was induced with cytotec  and pitocin . Epidural placed for pain relief.   Second Stage: Complete dilation at 1912 Onset of pushing at 1923 FHR second stage Cat I Delivery at 1939 on 02/01/2024  She progressed to complete and had a spontaneous vaginal birth of a live female over an intact perineum. The fetal head was delivered in OA position with restitution to LOA. No nuchal cord. Anterior then posterior shoulders delivered spontaneously with minimal assistance. Baby placed on mom's abdomen and attended to by transition RN. Cord clamped and cut after 1+ min by FOB. Cord blood obtained for newborn labs.  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 Blood Loss (mL):  250  Complications: none  Mom to postpartum.  Baby to Couplet care / Skin to Skin.  Newborn: Birth Weight: pending  Apgar Scores: 9, 9 Feeding planned: Breastfeeding   Margery FORBES Coe, CNM 02/01/2024 7:48 PM

## 2023-02-28 DIAGNOSIS — T50905A Adverse effect of unspecified drugs, medicaments and biological substances, initial encounter: Secondary | ICD-10-CM | POA: Diagnosis not present

## 2023-02-28 DIAGNOSIS — R002 Palpitations: Secondary | ICD-10-CM | POA: Diagnosis not present

## 2023-02-28 DIAGNOSIS — R399 Unspecified symptoms and signs involving the genitourinary system: Secondary | ICD-10-CM | POA: Diagnosis not present

## 2023-02-28 DIAGNOSIS — R42 Dizziness and giddiness: Secondary | ICD-10-CM | POA: Diagnosis not present

## 2023-03-07 DIAGNOSIS — N611 Abscess of the breast and nipple: Secondary | ICD-10-CM | POA: Diagnosis not present

## 2023-03-07 DIAGNOSIS — L400 Psoriasis vulgaris: Secondary | ICD-10-CM | POA: Diagnosis not present

## 2023-03-09 DIAGNOSIS — F331 Major depressive disorder, recurrent, moderate: Secondary | ICD-10-CM | POA: Diagnosis not present

## 2023-03-09 DIAGNOSIS — F411 Generalized anxiety disorder: Secondary | ICD-10-CM | POA: Diagnosis not present

## 2023-04-13 ENCOUNTER — Telehealth: Payer: Self-pay | Admitting: Internal Medicine

## 2023-04-13 NOTE — Telephone Encounter (Signed)
New Message:      Patient said her doctor referred her to Dr Graciela Husbands. She only wants to see Dr Graciela Husbands. She says her Mother and Sister are patients of Dr Graciela Husbands. She said her doctor says he thinks he has the same things thing that her Mother and Sister has. Her Mother name is Herbert Seta Welker(01-18-71) and her Sister is Dorene Grebe Graham(01-26-1999).

## 2023-05-26 ENCOUNTER — Ambulatory Visit: Payer: 59 | Admitting: Cardiology

## 2023-07-06 ENCOUNTER — Other Ambulatory Visit: Payer: Self-pay | Admitting: Obstetrics

## 2023-07-06 NOTE — Progress Notes (Signed)
 Patient is about [redacted] weeks pregnant, with hyperemesis. She has lost a total of 7 lbs in about 4 days. I have ordered a banana bag weekly for 3 doses.  Arzella Laurence CNM

## 2023-07-10 ENCOUNTER — Ambulatory Visit
Admission: RE | Admit: 2023-07-10 | Discharge: 2023-07-10 | Disposition: A | Discharge status: Home / Self Care | Source: Ambulatory Visit | Attending: Obstetrics | Admitting: Obstetrics

## 2023-07-10 DIAGNOSIS — R42 Dizziness and giddiness: Secondary | ICD-10-CM | POA: Insufficient documentation

## 2023-07-10 DIAGNOSIS — M25552 Pain in left hip: Secondary | ICD-10-CM | POA: Diagnosis present

## 2023-07-10 DIAGNOSIS — M545 Low back pain, unspecified: Secondary | ICD-10-CM | POA: Insufficient documentation

## 2023-07-10 DIAGNOSIS — Z3201 Encounter for pregnancy test, result positive: Secondary | ICD-10-CM | POA: Insufficient documentation

## 2023-07-10 DIAGNOSIS — G8929 Other chronic pain: Secondary | ICD-10-CM | POA: Insufficient documentation

## 2023-07-10 MED ORDER — THIAMINE HCL 100 MG/ML IJ SOLN
INTRAVENOUS | Status: DC
  Filled 2023-07-10: qty 1000

## 2023-07-12 ENCOUNTER — Ambulatory Visit: Attending: Cardiology | Admitting: Cardiology

## 2023-07-12 ENCOUNTER — Encounter: Payer: Self-pay | Admitting: Cardiology

## 2023-07-12 VITALS — BP 98/70 | HR 117 | Ht 65.0 in | Wt 132.0 lb

## 2023-07-12 DIAGNOSIS — R55 Syncope and collapse: Secondary | ICD-10-CM | POA: Diagnosis present

## 2023-07-12 NOTE — Progress Notes (Signed)
 Electrophysiology Office Note:    Date:  07/12/2023   ID:  Sarah Haney, DOB Nov 12, 1995, MRN [id number]  [facility] Cardiologist:  None  [facility] Electrophysiologist:  Sarah Byes, MD   Referring MD: Sarah Haney,*   Chief Complaint: Lightheadedness  History of Present Illness:    Ms. Sarah Haney is a 28 year old woman who I am seeing today for an evaluation of presyncope and syncope at the request of Dr. Gwendalyn Haney.  The patient recently found out she was pregnant.  She has a family history of similar symptoms including a family member with EDS/POTS.  The patient tells me that she has had trouble with lightheadedness/presyncope/syncope since her daughter was born.  She may have had symptoms before that but they definitely worsened during the pregnancy.  She think she has had a total of 5-6 syncopal episodes.  Heat is a common trigger.  She has a prodrome of lightheadedness and dizziness with visual changes.  The symptoms worsen the longer she stands up.  If she moves to a cooler location and sits down sometimes the symptoms improved.  She has seen other providers for this in the past who recommended staying hydrated.  She has felt poorly during the first trimester of this pregnancy.  She has had nausea and intolerance to p.o.  She has tried to drink Gatorade and was able to keep some food down today.    Their past medical, social and family history was reviewed.   ROS:   Please see the history of present illness.    All other systems reviewed and are negative.  EKGs/Labs/Other Studies Reviewed:    The following studies were reviewed today:  March 2023 echo EF normal RV normal No significant valvular abnormalities  April 2023 ZIO monitor Average heart rate 94 No arrhythmias Rare ectopy    EKG Interpretation Date/Time:  Wednesday Jul 12 2023 15:53:38 EDT Ventricular Rate:  102 PR Interval:  142 QRS Duration:  78 QT Interval:  324 QTC  Calculation: 422 R Axis:   78  Text Interpretation: Sinus tachycardia Possible Left atrial enlargement Confirmed by Sarah Haney [phone]) on 07/12/2023 4:46:13 PM    Physical Exam:    VS:  BP 98/70   Pulse (!) 117   Ht 5\' 5"  (1.651 m)   Wt 132 lb (59.9 kg)   SpO2 94%   BMI 21.97 kg/m     Wt Readings from Last 3 Encounters:  07/12/23 132 lb (59.9 kg)  01/31/23 148 lb (67.1 kg)  02/03/22 155 lb (70.3 kg)     Orthostatics Flat 104/68, 104 Sitting 104/72, 123 Standing 112/80, 138 3-minute standing 89/68, 156    GEN: no distress CARD: RRR, No MRG RESP: No IWOB. CTAB.        ASSESSMENT AND PLAN:    1. Syncope, unspecified syncope type     #Dysautonomia #POTS #Orthostatic hypotension The patient's symptoms and history seem most consistent with a dysautonomia.  I suspect she has elements of POTS and orthostatic hypotension given today's orthostatic vital signs.  We discussed the pathophysiology of dysautonomia during today's office visit.  We discussed how this can be a frustrating diagnosis.  We discussed the data supporting treatment options.   I have recommended that she liberalize salt intake to 8 to 12 g of sodium, increase water  intake to at least 3 L daily.  He should also incorporate aerobic exercise into her activities as this has been shown to help patients with POTS.  The session  should occur at least 4 times per week for 30 to 40 minutes per session.  Recumbent activities would be best.  I have also recommended compression garments as these have been shown to help some patients with dysautonomia/POTS.  She can also consider elevating the head of the bed by 6 to 8 inches.  She will follow-up with the EP APP in 3 months.  If stable at that appointment, routine primary care follow-up.     Signed, Sarah Haney. Sarah Slimmer, MD, [facility], [facility] 07/12/2023 4:51 PM    Electrophysiology [facility]

## 2023-07-12 NOTE — Patient Instructions (Signed)
 Medication Instructions:  Your physician recommends that you continue on your current medications as directed. Please refer to the Current Medication list given to you today.  *If you need a refill on your cardiac medications before your next appointment, please call your pharmacy*  Follow-Up: At [facility], you and your health needs are our priority.  As part of our continuing mission to provide you with exceptional heart care, our providers are all part of one team.  This team includes your primary Cardiologist (physician) and Advanced Practice Providers or APPs (Physician Assistants and Nurse Practitioners) who all work together to provide you with the care you need, when you need it.  Your next appointment:   3 months  Provider:   Sherie Don, NP

## 2023-07-17 ENCOUNTER — Inpatient Hospital Stay: Admission: RE | Admit: 2023-07-17 | Source: Ambulatory Visit

## 2023-07-18 ENCOUNTER — Ambulatory Visit
Admission: RE | Admit: 2023-07-18 | Discharge: 2023-07-18 | Disposition: A | Discharge status: Home / Self Care | Source: Ambulatory Visit | Attending: Obstetrics | Admitting: Obstetrics

## 2023-07-18 DIAGNOSIS — Z3A09 9 weeks gestation of pregnancy: Secondary | ICD-10-CM | POA: Diagnosis not present

## 2023-07-18 DIAGNOSIS — O210 Mild hyperemesis gravidarum: Secondary | ICD-10-CM | POA: Diagnosis present

## 2023-07-18 MED ORDER — THIAMINE HCL 100 MG/ML IJ SOLN
Freq: Once | INTRAVENOUS | Status: DC
  Filled 2023-07-18: qty 1000

## 2023-07-24 ENCOUNTER — Ambulatory Visit
Admission: RE | Admit: 2023-07-24 | Discharge: 2023-07-24 | Disposition: A | Discharge status: Home / Self Care | Source: Ambulatory Visit | Attending: Obstetrics | Admitting: Obstetrics

## 2023-08-07 DIAGNOSIS — O0991 Supervision of high risk pregnancy, unspecified, first trimester: Secondary | ICD-10-CM | POA: Insufficient documentation

## 2023-08-14 NOTE — Addendum Note (Signed)
 Encounter addended by: Verdon Emma CROME, RN on: 08/14/2023 11:35 AM  Actions taken: MAR administration accepted, Charge Capture section accepted

## 2023-08-16 LAB — OB RESULTS CONSOLE RUBELLA ANTIBODY, IGM: Rubella: IMMUNE

## 2023-08-16 LAB — OB RESULTS CONSOLE VARICELLA ZOSTER ANTIBODY, IGG: Varicella: IMMUNE

## 2023-08-21 ENCOUNTER — Other Ambulatory Visit: Payer: Self-pay | Admitting: Certified Nurse Midwife

## 2023-08-21 DIAGNOSIS — O0992 Supervision of high risk pregnancy, unspecified, second trimester: Secondary | ICD-10-CM

## 2023-08-30 ENCOUNTER — Other Ambulatory Visit: Payer: Self-pay

## 2023-08-30 ENCOUNTER — Telehealth: Payer: Self-pay | Admitting: Cardiology

## 2023-08-30 ENCOUNTER — Emergency Department
Admission: EM | Admit: 2023-08-30 | Discharge: 2023-08-30 | Disposition: A | Discharge status: Home / Self Care | Attending: Emergency Medicine | Admitting: Emergency Medicine

## 2023-08-30 DIAGNOSIS — Z3A15 15 weeks gestation of pregnancy: Secondary | ICD-10-CM | POA: Diagnosis not present

## 2023-08-30 DIAGNOSIS — O211 Hyperemesis gravidarum with metabolic disturbance: Secondary | ICD-10-CM | POA: Insufficient documentation

## 2023-08-30 DIAGNOSIS — O219 Vomiting of pregnancy, unspecified: Secondary | ICD-10-CM | POA: Diagnosis present

## 2023-08-30 HISTORY — DX: Tuberculosis of spine: A18.01

## 2023-08-30 LAB — CBC
HCT: 34.8 % — ABNORMAL LOW (ref 36.0–46.0)
Hemoglobin: 12.7 g/dL (ref 12.0–15.0)
MCH: 33.5 pg (ref 26.0–34.0)
MCHC: 36.5 g/dL — ABNORMAL HIGH (ref 30.0–36.0)
MCV: 91.8 fL (ref 80.0–100.0)
Platelets: 250 K/uL (ref 150–400)
RBC: 3.79 MIL/uL — ABNORMAL LOW (ref 3.87–5.11)
RDW: 11.8 % (ref 11.5–15.5)
WBC: 10.8 K/uL — ABNORMAL HIGH (ref 4.0–10.5)
nRBC: 0 % (ref 0.0–0.2)

## 2023-08-30 LAB — COMPREHENSIVE METABOLIC PANEL WITH GFR
ALT: 9 U/L (ref 0–44)
AST: 17 U/L (ref 15–41)
Albumin: 3.8 g/dL (ref 3.5–5.0)
Alkaline Phosphatase: 63 U/L (ref 38–126)
Anion gap: 11 (ref 5–15)
BUN: 9 mg/dL (ref 6–20)
CO2: 21 mmol/L — ABNORMAL LOW (ref 22–32)
Calcium: 8.9 mg/dL (ref 8.9–10.3)
Chloride: 103 mmol/L (ref 98–111)
Creatinine, Ser: 0.58 mg/dL (ref 0.44–1.00)
GFR, Estimated: >60 mL/min (ref >60)
Glucose, Bld: 100 mg/dL — ABNORMAL HIGH (ref 70–99)
Potassium: 2.8 mmol/L — ABNORMAL LOW (ref 3.5–5.1)
Sodium: 135 mmol/L (ref 135–145)
Total Bilirubin: 0.6 mg/dL (ref 0.0–1.2)
Total Protein: 6.6 g/dL (ref 6.5–8.1)

## 2023-08-30 LAB — URINALYSIS, ROUTINE W REFLEX MICROSCOPIC
Bilirubin Urine: NEGATIVE
Glucose, UA: NEGATIVE mg/dL
Ketones, ur: 20 mg/dL — AB
Nitrite: NEGATIVE
Protein, ur: 30 mg/dL — AB
Specific Gravity, Urine: 1.014 (ref 1.005–1.030)
pH: 6 (ref 5.0–8.0)

## 2023-08-30 LAB — LIPASE, BLOOD: Lipase: 30 U/L (ref 11–51)

## 2023-08-30 LAB — POC URINE PREG, ED: Preg Test, Ur: POSITIVE — AB

## 2023-08-30 LAB — MAGNESIUM: Magnesium: 1.9 mg/dL (ref 1.7–2.4)

## 2023-08-30 MED ORDER — DEXTROSE IN LACTATED RINGERS 5 % IV SOLN
2000.0000 mL | Freq: Once | INTRAVENOUS | Status: AC
  Administered 2023-08-30: 1000 mL via INTRAVENOUS

## 2023-08-30 MED ORDER — FAMOTIDINE IN NACL 20-0.9 MG/50ML-% IV SOLN
20.0000 mg | Freq: Once | INTRAVENOUS | Status: AC
  Administered 2023-08-30: 20 mg via INTRAVENOUS
  Filled 2023-08-30: qty 50

## 2023-08-30 MED ORDER — METOCLOPRAMIDE HCL 5 MG/ML IJ SOLN
10.0000 mg | Freq: Once | INTRAMUSCULAR | Status: AC
  Administered 2023-08-30: 10 mg via INTRAVENOUS
  Filled 2023-08-30: qty 2

## 2023-08-30 MED ORDER — POTASSIUM CHLORIDE CRYS ER 20 MEQ PO TBCR
40.0000 meq | EXTENDED_RELEASE_TABLET | Freq: Once | ORAL | Status: AC
  Administered 2023-08-30: 40 meq via ORAL
  Filled 2023-08-30: qty 2

## 2023-08-30 NOTE — ED Provider Notes (Signed)
 [facility] Provider Note    Event Date/Time   First MD Initiated Contact with Patient 08/30/23 1657     (approximate)   History   Emesis   HPI  Sarah Haney is a 28 y.o. female who presents to the ED for evaluation of Emesis   G3 at about 15 weeks presents alongside her boyfriend for evaluation of recurrent emesis throughout this pregnancy.  Reports that this has been the worst pregnancy so far with regards to hyperemesis.  Very poor toleration of p.o. intake, recurrent emesis every day.  Last week she had an episode of dark brown/red streaking within her emesis.  Had another episode today so she called her OB/GYN and cardiologist who told her to come to the ED to get checked out.  She shows me a picture on her phone of emesis in the toilet bowl, couple dark black specks reminiscent of coffee grounds, no red blood.  Very low volume  History of POTS, which is why she sees cardiology.  No chest pain, shortness of breath, cough, hemoptysis, fever, vaginal bleeding or new discharge, no abdominal pain, no stool changes, no syncope.  Has had some presyncope   Physical Exam   Triage Vital Signs: ED Triage Vitals  Encounter Vitals Group     BP 08/30/23 1443 (!) 128/93     Girls Systolic BP Percentile --      Girls Diastolic BP Percentile --      Boys Systolic BP Percentile --      Boys Diastolic BP Percentile --      Pulse Rate 08/30/23 1443 (!) 122     Resp 08/30/23 1443 20     Temp 08/30/23 1443 98.9 F (37.2 C)     Temp Source 08/30/23 1443 Oral     SpO2 08/30/23 1443 100 %     Weight 08/30/23 1443 138 lb (62.6 kg)     Height 08/30/23 1443 5' 5 (1.651 m)     Head Circumference --      Peak Flow --      Pain Score 08/30/23 1444 0     Pain Loc --      Pain Education --      Exclude from Growth Chart --     Most recent vital signs: Vitals:   08/30/23 1700 08/30/23 1853  BP: 116/78   Pulse: 88   Resp: 18   Temp:  98.5 F (36.9 C)   SpO2: 98%     General: Awake, no distress.  CV:  Good peripheral perfusion.  Resp:  Normal effort.  Abd:  No distention.  Soft and benign without tenderness, guarding or peritoneal features. MSK:  No deformity noted.  Neuro:  No focal deficits appreciated. Other:     ED Results / Procedures / Treatments   Labs (all labs ordered are listed, but only abnormal results are displayed) Labs Reviewed  COMPREHENSIVE METABOLIC PANEL WITH GFR - Abnormal; Notable for the following components:      Result Value   Potassium 2.8 (*)    CO2 21 (*)    Glucose, Bld 100 (*)    All other components within normal limits  CBC - Abnormal; Notable for the following components:   WBC 10.8 (*)    RBC 3.79 (*)    HCT 34.8 (*)    MCHC 36.5 (*)    All other components within normal limits  URINALYSIS, ROUTINE W REFLEX MICROSCOPIC - Abnormal; Notable for the following  components:   Color, Urine YELLOW (*)    APPearance HAZY (*)    Hgb urine dipstick MODERATE (*)    Ketones, ur 20 (*)    Protein, ur 30 (*)    Leukocytes,Ua SMALL (*)    Bacteria, UA MANY (*)    All other components within normal limits  POC URINE PREG, ED - Abnormal; Notable for the following components:   Preg Test, Ur Positive (*)    All other components within normal limits  LIPASE, BLOOD  MAGNESIUM   CBG MONITORING, ED    EKG Sinus tachycardia with a rate of 133 bpm.  Normal axis and intervals without clear signs of acute ischemia.  RADIOLOGY   Official radiology report(s): No results found.  PROCEDURES and INTERVENTIONS:  Procedures  Medications  dextrose  5 % in lactated ringers  infusion (0 mLs Intravenous Stopped 08/30/23 1913)  metoCLOPramide  (REGLAN ) injection 10 mg (10 mg Intravenous Given 08/30/23 1729)  famotidine  (PEPCID ) IVPB 20 mg premix (0 mg Intravenous Stopped 08/30/23 1849)  potassium chloride  SA (KLOR-CON  M) CR tablet 40 mEq (40 mEq Oral Given 08/30/23 1850)     IMPRESSION / MDM / ASSESSMENT AND PLAN /  ED COURSE  I reviewed the triage vital signs and the nursing notes.  Differential diagnosis includes, but is not limited to, Mallory-Weiss tear, esophagitis, upper GI bleeding, gastritis  {Patient presents with symptoms of an acute illness or injury that is potentially life-threatening.  First trimester patient presents with signs of dehydration from hyperemesis gravidarum.  Resolving symptoms with IV fluids and antiemetics.  Regarding her blood/black specks in her emesis, with the recurrent nature of retching Mallory-Weiss is a possibility, doubt Boerhaave's, gastritis is a possibility as well.  Doubt more severe bleeding.  She has a normal hemoglobin, hypokalemia is replaced orally.  No obstetric symptoms to indicate emergent ultrasound.  She is suitable for outpatient management with OB/GYN follow-up  Clinical Course as of 08/30/23 1922  Wed Aug 30, 2023  1922 Reassessed.  Feeling well, tolerating p.o. intake and asking for discharge.  Discussed OB/GYN follow-up.  Discussed ED return precautions. [DS]    Clinical Course User Index [DS] Claudene Rover, MD     FINAL CLINICAL IMPRESSION(S) / ED DIAGNOSES   Final diagnoses:  Hyperemesis gravidarum with dehydration     Rx / DC Orders   ED Discharge Orders     None        Note:  This document was prepared using Dragon voice recognition software and may include unintentional dictation errors.   Claudene Rover, MD 08/30/23 [phone]

## 2023-08-30 NOTE — ED Triage Notes (Signed)
 Patient states she is G3P2 and approximately [redacted] weeks pregnant; today has had 3-4 episodes of dark black vomit and near syncope. Both OBGYN and Cardiologist sent her to ED.

## 2023-08-30 NOTE — Telephone Encounter (Signed)
 Called and spoke with the patient.  Patient said that she was on her way to the hospital emergency room after throwing up black stuff for the second time and being advised to go to the ER by her OB doctor.  This RN advised the patient to call 911 or have someone drive her to the emergency room.  At the this point the patient stated that she was at the light right before making a turn into the [facility] hospital complex.  Patient expressed gratitude for the call back.

## 2023-08-30 NOTE — Telephone Encounter (Signed)
 STAT if patient feels like he/she is going to faint   1. Are you feeling dizzy, lightheaded, or faint right now? yes    2. Have you passed out?  no (If yes move to .[facility])   3. Do you have any other symptoms? HR goes up, Patient is [redacted] weeks pregnant    4. Have you checked your HR and BP (record if available)? 171, 105, 181

## 2023-08-31 ENCOUNTER — Ambulatory Visit: Admission: RE | Admit: 2023-08-31 | Source: Ambulatory Visit

## 2023-09-01 ENCOUNTER — Telehealth: Payer: Self-pay | Admitting: Cardiovascular Disease

## 2023-09-01 ENCOUNTER — Emergency Department

## 2023-09-01 ENCOUNTER — Inpatient Hospital Stay
Admission: EM | Admit: 2023-09-01 | Discharge: 2023-09-04 | DRG: 833 | Disposition: A | Discharge status: Home / Self Care | Attending: Obstetrics | Admitting: Obstetrics

## 2023-09-01 ENCOUNTER — Other Ambulatory Visit: Payer: Self-pay

## 2023-09-01 DIAGNOSIS — F419 Anxiety disorder, unspecified: Secondary | ICD-10-CM | POA: Diagnosis present

## 2023-09-01 DIAGNOSIS — O211 Hyperemesis gravidarum with metabolic disturbance: Principal | ICD-10-CM | POA: Diagnosis present

## 2023-09-01 DIAGNOSIS — Z79899 Other long term (current) drug therapy: Secondary | ICD-10-CM

## 2023-09-01 DIAGNOSIS — Z3A15 15 weeks gestation of pregnancy: Secondary | ICD-10-CM

## 2023-09-01 DIAGNOSIS — O210 Mild hyperemesis gravidarum: Secondary | ICD-10-CM | POA: Diagnosis present

## 2023-09-01 DIAGNOSIS — Z833 Family history of diabetes mellitus: Secondary | ICD-10-CM

## 2023-09-01 DIAGNOSIS — O99012 Anemia complicating pregnancy, second trimester: Secondary | ICD-10-CM | POA: Diagnosis present

## 2023-09-01 DIAGNOSIS — Z6791 Unspecified blood type, Rh negative: Secondary | ICD-10-CM

## 2023-09-01 DIAGNOSIS — G90A Postural orthostatic tachycardia syndrome (POTS): Secondary | ICD-10-CM | POA: Diagnosis present

## 2023-09-01 DIAGNOSIS — R55 Syncope and collapse: Principal | ICD-10-CM

## 2023-09-01 DIAGNOSIS — O26892 Other specified pregnancy related conditions, second trimester: Secondary | ICD-10-CM | POA: Diagnosis present

## 2023-09-01 DIAGNOSIS — Z8249 Family history of ischemic heart disease and other diseases of the circulatory system: Secondary | ICD-10-CM

## 2023-09-01 DIAGNOSIS — R110 Nausea: Secondary | ICD-10-CM

## 2023-09-01 DIAGNOSIS — E860 Dehydration: Secondary | ICD-10-CM

## 2023-09-01 LAB — CBC
HCT: 32.9 % — ABNORMAL LOW (ref 36.0–46.0)
Hemoglobin: 11.9 g/dL — ABNORMAL LOW (ref 12.0–15.0)
MCH: 33.5 pg (ref 26.0–34.0)
MCHC: 36.2 g/dL — ABNORMAL HIGH (ref 30.0–36.0)
MCV: 92.7 fL (ref 80.0–100.0)
Platelets: 243 K/uL (ref 150–400)
RBC: 3.55 MIL/uL — ABNORMAL LOW (ref 3.87–5.11)
RDW: 11.8 % (ref 11.5–15.5)
WBC: 9.8 K/uL (ref 4.0–10.5)
nRBC: 0 % (ref 0.0–0.2)

## 2023-09-01 LAB — BASIC METABOLIC PANEL WITH GFR
Anion gap: 8 (ref 5–15)
BUN: 9 mg/dL (ref 6–20)
CO2: 21 mmol/L — ABNORMAL LOW (ref 22–32)
Calcium: 9.1 mg/dL (ref 8.9–10.3)
Chloride: 108 mmol/L (ref 98–111)
Creatinine, Ser: 0.5 mg/dL (ref 0.44–1.00)
GFR, Estimated: >60 mL/min (ref >60)
Glucose, Bld: 84 mg/dL (ref 70–99)
Potassium: 3 mmol/L — ABNORMAL LOW (ref 3.5–5.1)
Sodium: 137 mmol/L (ref 135–145)

## 2023-09-01 LAB — HCG, QUANTITATIVE, PREGNANCY: hCG, Beta Chain, Quant, S: 33536 m[IU]/mL — ABNORMAL HIGH (ref <5)

## 2023-09-01 LAB — TROPONIN I (HIGH SENSITIVITY): Troponin I (High Sensitivity): 10 ng/L (ref <18)

## 2023-09-01 MED ORDER — ONDANSETRON HCL 4 MG/2ML IJ SOLN
4.0000 mg | Freq: Once | INTRAMUSCULAR | Status: AC
  Administered 2023-09-02: 4 mg via INTRAVENOUS
  Filled 2023-09-01: qty 2

## 2023-09-01 MED ORDER — ACETAMINOPHEN 500 MG PO TABS
1000.0000 mg | ORAL_TABLET | Freq: Once | ORAL | Status: AC
  Administered 2023-09-02: 1000 mg via ORAL
  Filled 2023-09-01: qty 2

## 2023-09-01 MED ORDER — SODIUM CHLORIDE 0.9 % IV BOLUS
1000.0000 mL | Freq: Once | INTRAVENOUS | Status: AC
  Administered 2023-09-02: 1000 mL via INTRAVENOUS

## 2023-09-01 NOTE — ED Provider Notes (Signed)
 [facility] Provider Note    Event Date/Time   First MD Initiated Contact with Patient 09/01/23 2333     (approximate)   History   Chief Complaint Near Syncope and Tachycardia (15wks preg)   HPI  Sarah Haney is a 28 y.o. female G3P2002 at approximately 15 weeks of pregnancy who presents to the ED complaining of near syncope.  Patient reports that over the past 2 days she has been feeling intermittently dizzy and lightheaded, especially when standing up.  She will occasionally feel short of breath with these episodes, but denies any pain in her chest.  She states that she will feel like she is going to pass out and that her heart rate can get as high as the 180s.  She currently feels better while resting in the stretcher.  She does state that she has been feeling nauseous with difficulty tolerating oral intake over the past 2 days.  She has been taking Zofran  with partial relief, states she gets dizzy when taking Phenergan .  She was referred to the ED for hydration by her OB/GYN.     Physical Exam   Triage Vital Signs: ED Triage Vitals [09/01/23 1818]  Encounter Vitals Group     BP 112/82     Girls Systolic BP Percentile      Girls Diastolic BP Percentile      Boys Systolic BP Percentile      Boys Diastolic BP Percentile      Pulse Rate 97     Resp 20     Temp 98.7 F (37.1 C)     Temp Source Oral     SpO2 100 %     Weight 138 lb (62.6 kg)     Height 5' 5 (1.651 m)     Head Circumference      Peak Flow      Pain Score 4     Pain Loc      Pain Education      Exclude from Growth Chart     Most recent vital signs: Vitals:   09/01/23 1818 09/01/23 2342  BP: 112/82 128/87  Pulse: 97 89  Resp: 20 20  Temp: 98.7 F (37.1 C) 98.4 F (36.9 C)  SpO2: 100% 99%    Constitutional: Alert and oriented. Eyes: Conjunctivae are normal. Head: Atraumatic. Nose: No congestion/rhinnorhea. Mouth/Throat: Mucous membranes are moist.   Cardiovascular: Normal rate, regular rhythm. Grossly normal heart sounds.  2+ radial pulses bilaterally. Respiratory: Normal respiratory effort.  No retractions. Lungs CTAB. Gastrointestinal: Soft and nontender. No distention. Musculoskeletal: No lower extremity tenderness nor edema.  Neurologic:  Normal speech and language. No gross focal neurologic deficits are appreciated.    ED Results / Procedures / Treatments   Labs (all labs ordered are listed, but only abnormal results are displayed) Labs Reviewed  BASIC METABOLIC PANEL WITH GFR - Abnormal; Notable for the following components:      Result Value   Potassium 3.0 (*)    CO2 21 (*)    All other components within normal limits  CBC - Abnormal; Notable for the following components:   RBC 3.55 (*)    Hemoglobin 11.9 (*)    HCT 32.9 (*)    MCHC 36.2 (*)    All other components within normal limits  HCG, QUANTITATIVE, PREGNANCY - Abnormal; Notable for the following components:   hCG, Beta Chain, Quant, S 33,536 (*)    All other components within normal limits  TROPONIN I (HIGH SENSITIVITY)     EKG  ED ECG REPORT I, Carlin Palin, the attending physician, personally viewed and interpreted this ECG.   Date: 09/01/2023  EKG Time: 18:23  Rate: 96  Rhythm: sinus tachycardia  Axis: Normal  Intervals:none  ST&T Change: None  RADIOLOGY Obstetric ultrasound reviewed and interpreted by me with intrauterine pregnancy and appropriate fetal heart rate.  PROCEDURES:  Critical Care performed: No  Procedures   MEDICATIONS ORDERED IN ED: Medications  QUEtiapine  (SEROQUEL ) tablet 100 mg (has no administration in time range)  sodium chloride  0.9 % bolus 1,000 mL (0 mLs Intravenous Stopped 09/02/23 0227)  ondansetron  (ZOFRAN ) injection 4 mg (4 mg Intravenous Given 09/02/23 0027)  acetaminophen  (TYLENOL ) tablet 1,000 mg (1,000 mg Oral Given 09/02/23 0027)  potassium chloride  SA (KLOR-CON  M) CR tablet 40 mEq (40 mEq Oral Given  09/02/23 0027)  sodium chloride  0.9 % bolus 1,000 mL (1,000 mLs Intravenous New Bag/Given 09/02/23 0231)  promethazine  (PHENERGAN ) 12.5 mg in sodium chloride  0.9 % 50 mL IVPB (0 mg Intravenous Stopped 09/02/23 0306)     IMPRESSION / MDM / ASSESSMENT AND PLAN / ED COURSE  I reviewed the triage vital signs and the nursing notes.                              28 y.o. female G3P2002 at approximately 15 weeks of pregnancy who presents to the ED complaining of intermittent dizziness, lightheadedness, and presyncope over the past 2 days associated with nausea and vomiting.  Patient's presentation is most consistent with acute presentation with potential threat to life or bodily function.  Differential diagnosis includes, but is not limited to, dehydration, orthostatic hypotension, arrhythmia, vasovagal episode, POTS, electrolyte abnormality, AKI.  Patient nontoxic-appearing and in no acute distress, vital signs are unremarkable.  She has a benign abdominal exam, EKG shows no evidence of arrhythmia or ischemia.  Troponin within normal limits and I doubt ACS or PE.  Labs with mild hypokalemia but otherwise reassuring with no significant anemia, leukocytosis, or AKI.  Beta-hCG is appropriately elevated, obstetric ultrasound is reassuring with appropriate fetal heart rate.  Suspect dehydration exacerbating her known POTS, will treat with IV Zofran  and give fluid bolus.  Case discussed with Dr. Verdon of OB/GYN, will plan to reassess following fluid bolus to determine appropriate disposition.  Patient continues to have difficulty tolerating oral intake despite Zofran  and Phenergan .  Case discussed with Dr. Verdon, who will admit the patient for ongoing hydration and treatment of intractable nausea.      FINAL CLINICAL IMPRESSION(S) / ED DIAGNOSES   Final diagnoses:  Near syncope  Dehydration  Intractable nausea     Rx / DC Orders   ED Discharge Orders     None        Note:  This document  was prepared using Dragon voice recognition software and may include unintentional dictation errors.   Palin Carlin, MD 09/02/23 [phone]

## 2023-09-01 NOTE — ED Triage Notes (Signed)
 Pt to ED via POV from home. Pt reports near syncope and feeling light headed since 7/9. Pt also reports HR jumping to 180s. Pt is G3P2 and approximately [redacted]wks pregnant. Pt sent by Dr. Katheryne for further evaluation. Pt with hx of POTS.   RN called L&D due to pt being sent by Dr. Verdon. Due to gestation pt needs to be seen in ED first.

## 2023-09-01 NOTE — ED Notes (Signed)
 Pt to desk to ask why she wasn't sent upstairs. Pt was informed that she is 15 weeks and they will not see her upstairs. Pt states I called my OB office and they told me to come in and I would be observes upstairs. This RN called upstairs. RN talked to beasley and advised she was to be seen down here and if they needed a consult they could call upstairs.

## 2023-09-01 NOTE — ED Provider Triage Note (Signed)
 Emergency Medicine Provider Triage Evaluation Note  Sarah Haney , a 28 y.o. female  was evaluated in triage.  Pt complains of near syncope, lightheadedness x 2 days, Tachycardic episodes in 180s at home. Currently [redacted] weeks pregnant. Hx POTS. Sent by Dr. Verdon SHIPPER.    Physical Exam  BP 112/82   Pulse 97   Temp 98.7 F (37.1 C) (Oral)   Resp 20   Ht 5' 5 (1.651 m)   Wt 62.6 kg   LMP 05/23/2023 (Approximate)   SpO2 100%   BMI 22.96 kg/m  Gen:   Awake, no distress   Resp:  Normal effort  MSK:   Moves extremities without difficulty  Other:    Medical Decision Making  Medically screening exam initiated at 6:43 PM.  Appropriate orders placed.  WENDIE DISKIN was informed that the remainder of the evaluation will be completed by another provider, this initial triage assessment does not replace that evaluation, and the importance of remaining in the ED until their evaluation is complete.  EKG, US  OB complete >14 wks, BMP, CBC, beta hCG, troponin   Sheron Neshanic, PA-C 09/01/23 1845

## 2023-09-01 NOTE — Telephone Encounter (Signed)
 The patient called reporting episodes of elevated heart rate and feeling as though she is going to pass out. She also noted that during these episodes, she experiences blurred vision, shortness of breath, and a sensation of a head rush. The patient stated her heart rate can increase to as high as 180 bpm, and she must sit down to prevent syncope. She reported that upon sitting, her heart rate decreases to approximately 155-160 bpm. She also noted that if she stands or moves around for more than 10 minutes, the symptoms reoccur.  At the time of the call, the patient reported feeling that her heart was racing, despite having remained in bed.  Based on her current symptoms, the nurse advised the patient to go to the emergency room for further evaluation. The nurse also instructed the patient not to drive. The patient verbalized understanding.

## 2023-09-01 NOTE — Telephone Encounter (Signed)
..  [facility]   Pt c/o Syncope: STAT if syncope occurred within 24 hours and pt complains of lightheadedness.   High Priority if episode of passing out, completely, today or in last 24 hours   1. Did you pass out today?   No   2. When is the last time you passed out?   A couple of months ago   3. Has this occurred multiple times?   Yes  4. Did you have any symptoms prior to passing out?     5. Did you fall? If so, are you on a blood thinner?   No   Patient stated she felt like she was going to pass out but did not actually pass out.  Patient stated her HR was 180.  Patient noted she is [redacted] weeks pregnant.  Patient stated she sometimes gets lightheaded.  Patient has ED f/u visit scheduled on 7/22 with C. Franchester.

## 2023-09-02 DIAGNOSIS — O210 Mild hyperemesis gravidarum: Secondary | ICD-10-CM | POA: Diagnosis present

## 2023-09-02 LAB — CBC
HCT: 29.9 % — ABNORMAL LOW (ref 36.0–46.0)
Hemoglobin: 10.9 g/dL — ABNORMAL LOW (ref 12.0–15.0)
MCH: 34 pg (ref 26.0–34.0)
MCHC: 36.5 g/dL — ABNORMAL HIGH (ref 30.0–36.0)
MCV: 93.1 fL (ref 80.0–100.0)
Platelets: 192 K/uL (ref 150–400)
RBC: 3.21 MIL/uL — ABNORMAL LOW (ref 3.87–5.11)
RDW: 12 % (ref 11.5–15.5)
WBC: 8 K/uL (ref 4.0–10.5)
nRBC: 0 % (ref 0.0–0.2)

## 2023-09-02 LAB — HEPATIC FUNCTION PANEL
ALT: 7 U/L (ref 0–44)
AST: 11 U/L — ABNORMAL LOW (ref 15–41)
Albumin: 2.8 g/dL — ABNORMAL LOW (ref 3.5–5.0)
Alkaline Phosphatase: 46 U/L (ref 38–126)
Bilirubin, Direct: <0.1 mg/dL (ref 0.0–0.2)
Total Bilirubin: 0.6 mg/dL (ref 0.0–1.2)
Total Protein: 5.1 g/dL — ABNORMAL LOW (ref 6.5–8.1)

## 2023-09-02 LAB — POTASSIUM: Potassium: 2.7 mmol/L — CL (ref 3.5–5.1)

## 2023-09-02 LAB — MAGNESIUM: Magnesium: 1.6 mg/dL — ABNORMAL LOW (ref 1.7–2.4)

## 2023-09-02 LAB — TSH: TSH: 1.587 u[IU]/mL (ref 0.350–4.500)

## 2023-09-02 LAB — PHOSPHORUS: Phosphorus: 2.3 mg/dL — ABNORMAL LOW (ref 2.5–4.6)

## 2023-09-02 MED ORDER — SODIUM CHLORIDE 0.9 % IV BOLUS
1000.0000 mL | Freq: Once | INTRAVENOUS | Status: AC
  Administered 2023-09-02: 1000 mL via INTRAVENOUS

## 2023-09-02 MED ORDER — METHYLPREDNISOLONE 4 MG PO TABS: 8.0000 mg | ORAL_TABLET | Freq: Every day | ORAL | Status: DC

## 2023-09-02 MED ORDER — PROMETHAZINE HCL 25 MG PO TABS
12.5000 mg | ORAL_TABLET | ORAL | Status: DC | PRN
  Administered 2023-09-02 – 2023-09-03 (×3): 12.5 mg via ORAL
  Administered 2023-09-04: 25 mg via ORAL
  Filled 2023-09-02 (×4): qty 1

## 2023-09-02 MED ORDER — HYDROXYZINE HCL 25 MG PO TABS: 50.0000 mg | ORAL_TABLET | Freq: Four times a day (QID) | ORAL | Status: DC | PRN

## 2023-09-02 MED ORDER — SODIUM CHLORIDE 0.9 % IV SOLN
12.5000 mg | Freq: Once | INTRAVENOUS | Status: AC
  Administered 2023-09-02: 12.5 mg via INTRAVENOUS
  Filled 2023-09-02: qty 0.5

## 2023-09-02 MED ORDER — METHYLPREDNISOLONE 4 MG PO TABS: 4.0000 mg | ORAL_TABLET | Freq: Every day | ORAL | Status: DC

## 2023-09-02 MED ORDER — PROMETHAZINE HCL 25 MG RE SUPP: 12.5000 mg | RECTAL | Status: DC | PRN

## 2023-09-02 MED ORDER — METHYLPREDNISOLONE SODIUM SUCC 125 MG IJ SOLR
48.0000 mg | Freq: Once | INTRAMUSCULAR | Status: AC
  Administered 2023-09-02: 48 mg via INTRAVENOUS
  Filled 2023-09-02: qty 2

## 2023-09-02 MED ORDER — FAMOTIDINE 20 MG PO TABS
20.0000 mg | ORAL_TABLET | Freq: Every day | ORAL | Status: DC
Start: 2023-09-02 — End: 2023-09-04
  Administered 2023-09-02 – 2023-09-04 (×3): 20 mg via ORAL
  Filled 2023-09-02 (×3): qty 1

## 2023-09-02 MED ORDER — SODIUM CHLORIDE 0.9 % IV SOLN: 8.0000 mg | Freq: Three times a day (TID) | INTRAVENOUS | Status: DC | PRN

## 2023-09-02 MED ORDER — CALCIUM CARBONATE ANTACID 500 MG PO CHEW
2.0000 | CHEWABLE_TABLET | ORAL | Status: DC | PRN
  Administered 2023-09-02 – 2023-09-03 (×2): 400 mg via ORAL
  Filled 2023-09-02 (×2): qty 2

## 2023-09-02 MED ORDER — FAMOTIDINE IN NACL 20-0.9 MG/50ML-% IV SOLN
20.0000 mg | Freq: Every day | INTRAVENOUS | Status: DC
  Filled 2023-09-02 (×3): qty 50

## 2023-09-02 MED ORDER — THIAMINE HCL 100 MG/ML IJ SOLN
100.0000 mg | Freq: Every day | INTRAMUSCULAR | Status: DC
  Filled 2023-09-02 (×3): qty 1

## 2023-09-02 MED ORDER — METHYLPREDNISOLONE 4 MG PO TABS
16.0000 mg | ORAL_TABLET | Freq: Every day | ORAL | Status: DC
Start: 2023-09-03 — End: 2023-09-07
  Administered 2023-09-03 – 2023-09-04 (×2): 16 mg via ORAL
  Filled 2023-09-02 (×2): qty 4

## 2023-09-02 MED ORDER — POTASSIUM CHLORIDE 20 MEQ PO PACK
40.0000 meq | PACK | Freq: Two times a day (BID) | ORAL | Status: DC
  Filled 2023-09-02: qty 2

## 2023-09-02 MED ORDER — VITAMIN B-1 100 MG PO TABS
100.0000 mg | ORAL_TABLET | Freq: Every day | ORAL | Status: DC
  Administered 2023-09-02 – 2023-09-04 (×3): 100 mg via ORAL
  Filled 2023-09-02 (×4): qty 1

## 2023-09-02 MED ORDER — LACTATED RINGERS IV SOLN: INTRAVENOUS | Status: AC

## 2023-09-02 MED ORDER — ZOLPIDEM TARTRATE 5 MG PO TABS: 5.0000 mg | ORAL_TABLET | Freq: Every evening | ORAL | Status: DC | PRN

## 2023-09-02 MED ORDER — ONDANSETRON HCL 4 MG/2ML IJ SOLN: 4.0000 mg | Freq: Three times a day (TID) | INTRAMUSCULAR | Status: DC

## 2023-09-02 MED ORDER — POTASSIUM CHLORIDE 10 MEQ/100ML IV SOLN
10.0000 meq | INTRAVENOUS | Status: AC
  Administered 2023-09-02 (×4): 10 meq via INTRAVENOUS
  Filled 2023-09-02 (×4): qty 100

## 2023-09-02 MED ORDER — ACETAMINOPHEN 325 MG PO TABS
650.0000 mg | ORAL_TABLET | ORAL | Status: DC | PRN
  Administered 2023-09-02 – 2023-09-04 (×4): 650 mg via ORAL
  Filled 2023-09-02 (×4): qty 2

## 2023-09-02 MED ORDER — HYDROXYZINE HCL 50 MG/ML IM SOLN: 50.0000 mg | Freq: Four times a day (QID) | INTRAMUSCULAR | Status: DC | PRN

## 2023-09-02 MED ORDER — METHYLPREDNISOLONE 4 MG PO TABS
16.0000 mg | ORAL_TABLET | Freq: Every day | ORAL | Status: DC
  Administered 2023-09-03: 16 mg via ORAL
  Filled 2023-09-02 (×2): qty 4

## 2023-09-02 MED ORDER — ONDANSETRON 4 MG PO TBDP
4.0000 mg | ORAL_TABLET | Freq: Three times a day (TID) | ORAL | Status: DC
  Administered 2023-09-02 – 2023-09-04 (×6): 8 mg via ORAL
  Filled 2023-09-02: qty 1
  Filled 2023-09-02 (×4): qty 2
  Filled 2023-09-02: qty 1
  Filled 2023-09-02 (×2): qty 2

## 2023-09-02 MED ORDER — FOLIC ACID 1 MG PO TABS
1.0000 mg | ORAL_TABLET | Freq: Every day | ORAL | Status: DC
  Administered 2023-09-02 – 2023-09-04 (×3): 1 mg via ORAL
  Filled 2023-09-02 (×3): qty 1

## 2023-09-02 MED ORDER — METHYLPREDNISOLONE 4 MG PO TABS
16.0000 mg | ORAL_TABLET | Freq: Every day | ORAL | Status: DC
  Administered 2023-09-03: 16 mg via ORAL
  Filled 2023-09-02 (×3): qty 4

## 2023-09-02 MED ORDER — ONDANSETRON 4 MG PO TBDP
4.0000 mg | ORAL_TABLET | Freq: Three times a day (TID) | ORAL | Status: DC | PRN
  Administered 2023-09-02: 4 mg via ORAL
  Filled 2023-09-02: qty 1

## 2023-09-02 MED ORDER — ONDANSETRON HCL 4 MG/2ML IJ SOLN: 4.0000 mg | Freq: Three times a day (TID) | INTRAMUSCULAR | Status: DC | PRN

## 2023-09-02 MED ORDER — PRENATAL MULTIVITAMIN CH
1.0000 | ORAL_TABLET | Freq: Every day | ORAL | Status: DC
  Administered 2023-09-02 – 2023-09-04 (×3): 1 via ORAL
  Filled 2023-09-02 (×3): qty 1

## 2023-09-02 MED ORDER — LACTATED RINGERS IV BOLUS
1000.0000 mL | INTRAVENOUS | Status: AC
  Administered 2023-09-02 (×2): 1000 mL via INTRAVENOUS

## 2023-09-02 MED ORDER — FOLIC ACID 5 MG/ML IJ SOLN
1.0000 mg | Freq: Every day | INTRAMUSCULAR | Status: DC
  Filled 2023-09-02 (×3): qty 0.2

## 2023-09-02 MED ORDER — DOCUSATE SODIUM 100 MG PO CAPS
100.0000 mg | ORAL_CAPSULE | Freq: Every day | ORAL | Status: DC
  Administered 2023-09-02: 100 mg via ORAL
  Filled 2023-09-02: qty 1

## 2023-09-02 MED ORDER — MAGNESIUM SULFATE 2 GM/50ML IV SOLN
2.0000 g | Freq: Once | INTRAVENOUS | Status: AC
  Administered 2023-09-02: 2 g via INTRAVENOUS
  Filled 2023-09-02: qty 50

## 2023-09-02 MED ORDER — POTASSIUM CHLORIDE CRYS ER 20 MEQ PO TBCR: 40.0000 meq | EXTENDED_RELEASE_TABLET | ORAL | Status: DC

## 2023-09-02 MED ORDER — SODIUM CHLORIDE 0.9 % IV SOLN
8.0000 mg | Freq: Three times a day (TID) | INTRAVENOUS | Status: DC
  Filled 2023-09-02 (×7): qty 4

## 2023-09-02 MED ORDER — QUETIAPINE FUMARATE 25 MG PO TABS
100.0000 mg | ORAL_TABLET | Freq: Once | ORAL | Status: AC
  Administered 2023-09-02: 100 mg via ORAL
  Filled 2023-09-02: qty 4

## 2023-09-02 MED ORDER — POTASSIUM CHLORIDE CRYS ER 20 MEQ PO TBCR
40.0000 meq | EXTENDED_RELEASE_TABLET | Freq: Once | ORAL | Status: AC
  Administered 2023-09-02: 40 meq via ORAL
  Filled 2023-09-02: qty 2

## 2023-09-02 NOTE — Consult Note (Incomplete)
 PHARMACY CONSULT NOTE - ELECTROLYTES  Pharmacy Consult for Electrolyte Monitoring and Replacement   Recent Labs: Height: 5' 5 (165.1 cm) Weight: 62.6 kg (138 lb) IBW/kg (Calculated) : 57 Estimated Creatinine Clearance: 94.2 mL/min (by C-G formula based on SCr of 0.5 mg/dL). Potassium (mmol/L)  Date Value  09/01/2023 3.0 (L)  11/23/2012 3.5   Magnesium  (mg/dL)  Date Value  92/87/7974 1.6 (L)   Calcium  (mg/dL)  Date Value  92/88/7974 9.1   Calcium , Total (mg/dL)  Date Value  89/96/7985 9.0   Albumin (g/dL)  Date Value  92/87/7974 2.8 (L)  06/02/2015 4.6  11/23/2012 4.1   Phosphorus (mg/dL)  Date Value  92/87/7974 2.3 (L)   Sodium (mmol/L)  Date Value  09/01/2023 137  06/02/2015 143  11/23/2012 136   Corrected Ca: *** mg/dL  Assessment  Sarah Haney is a 28 y.o. female presenting with ***. PMH significant for ***. Pharmacy has been consulted to monitor and replace electrolytes.  Diet: *** MIVF: *** @ *** mL/hr Pertinent medications: ***  Goal of Therapy: Electrolytes WNL  Plan:  *** Check BMP, Mg, Phos with AM labs  Thank you for allowing pharmacy to be a part of this patient's care.  Raef Sprigg A Makylah Bossard, PharmD Clinical Pharmacist 09/02/2023 9:21 AM

## 2023-09-02 NOTE — Progress Notes (Signed)
 S: Sarah Haney is has been able to progress from NPO to full liquid with return of N/V symptoms. Taking Zofran  and phenergan  scheduled. Reports she feels better overall.   O: BP 108/77 (BP Location: Left Arm)   Pulse 91   Temp 98.3 F (36.8 C) (Oral)   Resp 18   Ht 5' 5 (1.651 m)   Wt 62.6 kg   LMP 05/23/2023 (Approximate)   SpO2 100%   BMI 22.96 kg/m    Physical Exam Constitutional:      Appearance: Normal appearance.  Cardiovascular:     Rate and Rhythm: Normal rate.  Pulmonary:     Effort: Pulmonary effort is normal.  Abdominal:     Palpations: Abdomen is soft.     Tenderness: There is no abdominal tenderness.     Comments: Gravid, c/w [redacted] week gestation   Musculoskeletal:        General: Normal range of motion.     Cervical back: Normal range of motion.  Skin:    General: Skin is warm and dry.     Capillary Refill: Capillary refill takes less than 2 seconds.  Neurological:     General: No focal deficit present.     Mental Status: She is alert.  Psychiatric:        Mood and Affect: Mood normal.    Results for orders placed or performed during the hospital encounter of 09/01/23 (from the past 48 hours)  Basic metabolic panel     Status: Abnormal   Collection Time: 09/01/23  6:20 PM  Result Value Ref Range   Sodium 137 135 - 145 mmol/L   Potassium 3.0 (L) 3.5 - 5.1 mmol/L   Chloride 108 98 - 111 mmol/L   CO2 21 (L) 22 - 32 mmol/L   Glucose, Bld 84 70 - 99 mg/dL    Comment: Glucose reference range applies only to samples taken after fasting for at least 8 hours.   BUN 9 6 - 20 mg/dL   Creatinine, Ser 9.49 0.44 - 1.00 mg/dL   Calcium  9.1 8.9 - 10.3 mg/dL   GFR, Estimated >39 >39 mL/min    Comment: (NOTE) Calculated using the CKD-EPI Creatinine Equation (2021)    Anion gap 8 5 - 15    Comment: Performed at [facility], [address] [place], [place] [address]  CBC     Status: Abnormal   Collection Time: 09/01/23  6:20 PM  Result Value Ref Range   WBC  9.8 4.0 - 10.5 K/uL   RBC 3.55 (L) 3.87 - 5.11 MIL/uL   Hemoglobin 11.9 (L) 12.0 - 15.0 g/dL   HCT 67.0 (L) 63.9 - 53.9 %   MCV 92.7 80.0 - 100.0 fL   MCH 33.5 26.0 - 34.0 pg   MCHC 36.2 (H) 30.0 - 36.0 g/dL   RDW 88.1 88.4 - 84.4 %   Platelets 243 150 - 400 K/uL   nRBC 0.0 0.0 - 0.2 %    Comment: Performed at [facility], [address], [place], [place] [address]  Troponin I (High Sensitivity)     Status: None   Collection Time: 09/01/23  6:20 PM  Result Value Ref Range   Troponin I (High Sensitivity) 10 <18 ng/L    Comment: (NOTE) Elevated high sensitivity troponin I (hsTnI) values and significant  changes across serial measurements may suggest ACS but many other  chronic and acute conditions are known to elevate hsTnI results.  Refer to the Links section for  chest pain algorithms and additional  guidance. Performed at [facility], [address] [place], [place] [address]   hCG, quantitative, pregnancy     Status: Abnormal   Collection Time: 09/01/23  6:20 PM  Result Value Ref Range   hCG, Beta Chain, Quant, S 33,536 (H) <5 mIU/mL    Comment:          GEST. AGE      CONC.  (mIU/mL)   <=1 WEEK        5 - 50     2 WEEKS       50 - 500     3 WEEKS       100 - 10,000     4 WEEKS     1,000 - 30,000     5 WEEKS     3,500 - 115,000   6-8 WEEKS     12,000 - 270,000    12 WEEKS     15,000 - 220,000        FEMALE AND NON-PREGNANT FEMALE:     LESS THAN 5 mIU/mL Performed at [facility], [address] [place], [place] [address]   TSH     Status: None   Collection Time: 09/02/23  4:55 AM  Result Value Ref Range   TSH 1.587 0.350 - 4.500 uIU/mL    Comment: Performed by a 3rd Generation assay with a functional sensitivity of <=0.01 uIU/mL. Performed at [facility], [address] [place], [place] [address]   CBC     Status: Abnormal   Collection Time: 09/02/23  4:55 AM  Result Value Ref Range   WBC 8.0 4.0 - 10.5 K/uL   RBC  3.21 (L) 3.87 - 5.11 MIL/uL   Hemoglobin 10.9 (L) 12.0 - 15.0 g/dL   HCT 70.0 (L) 63.9 - 53.9 %   MCV 93.1 80.0 - 100.0 fL   MCH 34.0 26.0 - 34.0 pg   MCHC 36.5 (H) 30.0 - 36.0 g/dL   RDW 87.9 88.4 - 84.4 %   Platelets 192 150 - 400 K/uL   nRBC 0.0 0.0 - 0.2 %    Comment: Performed at [facility], [address] [place], [place] [address]  Magnesium      Status: Abnormal   Collection Time: 09/02/23  4:55 AM  Result Value Ref Range   Magnesium  1.6 (L) 1.7 - 2.4 mg/dL    Comment: Performed at [facility], [address], [place], [place] [address]  Phosphorus     Status: Abnormal   Collection Time: 09/02/23  4:55 AM  Result Value Ref Range   Phosphorus 2.3 (L) 2.5 - 4.6 mg/dL    Comment: Performed at [facility], [address] [place], [place] [address]  Hepatic function panel     Status: Abnormal   Collection Time: 09/02/23  4:55 AM  Result Value Ref Range   Total Protein 5.1 (L) 6.5 - 8.1 g/dL   Albumin 2.8 (L) 3.5 - 5.0 g/dL   AST 11 (L) 15 - 41 U/L   ALT 7 0 - 44 U/L   Alkaline Phosphatase 46 38 - 126 U/L   Total Bilirubin 0.6 0.0 - 1.2 mg/dL   Bilirubin, Direct <9.8 0.0 - 0.2 mg/dL   Indirect Bilirubin NOT CALCULATED 0.3 - 0.9 mg/dL    Comment: Performed at [facility], [address] [place], [place] [address]  Potassium     Status: Abnormal   Collection Time: 09/02/23  4:55 AM  Result Value Ref Range   Potassium 2.7 (LL) 3.5 - 5.1 mmol/L    Comment: CRITICAL RESULT CALLED TO, READ BACK BY AND VERIFIED WITH DANA CHANDLER AT 0940 09/02/23.PMF Performed at [facility], [address] [place], [place] [address]       A/P Antepartum -continue routine antepartum care  -FHT q day   Hyperemesis:  -symptoms improving  -Weight stable - continue daily weights  -Continue scheduled antiemetics  -Advance diet as tolerated  -Plan to remain inpatient for electrolyte replacement and management of N/V  -Possible d/c  tomorrow if continues to improve   Electrolyte imbalance -Secondary to N/V of pregnancy  -Potassium and magnesium  replaced per pharmacy recommendations  -AM labs ordered   Therisa Pillow, CNM Certified Nurse Midwife [facility]
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
## 2023-09-02 NOTE — ED Notes (Signed)
 Pt called out due to fluids being complete and needing to use the restroom, this tech went in room and helped pt get unhooked from monitor so that she could use the restroom, tech will reconnect pt once pt is back in the room.

## 2023-09-03 DIAGNOSIS — Z79899 Other long term (current) drug therapy: Secondary | ICD-10-CM | POA: Diagnosis not present

## 2023-09-03 DIAGNOSIS — Z8249 Family history of ischemic heart disease and other diseases of the circulatory system: Secondary | ICD-10-CM | POA: Diagnosis not present

## 2023-09-03 DIAGNOSIS — O26892 Other specified pregnancy related conditions, second trimester: Secondary | ICD-10-CM | POA: Diagnosis present

## 2023-09-03 DIAGNOSIS — Z833 Family history of diabetes mellitus: Secondary | ICD-10-CM | POA: Diagnosis not present

## 2023-09-03 DIAGNOSIS — Z3A15 15 weeks gestation of pregnancy: Secondary | ICD-10-CM | POA: Diagnosis not present

## 2023-09-03 DIAGNOSIS — F419 Anxiety disorder, unspecified: Secondary | ICD-10-CM | POA: Diagnosis present

## 2023-09-03 DIAGNOSIS — O210 Mild hyperemesis gravidarum: Secondary | ICD-10-CM | POA: Diagnosis present

## 2023-09-03 DIAGNOSIS — Z6791 Unspecified blood type, Rh negative: Secondary | ICD-10-CM | POA: Diagnosis not present

## 2023-09-03 DIAGNOSIS — O99012 Anemia complicating pregnancy, second trimester: Secondary | ICD-10-CM | POA: Diagnosis present

## 2023-09-03 DIAGNOSIS — G90A Postural orthostatic tachycardia syndrome (POTS): Secondary | ICD-10-CM | POA: Diagnosis present

## 2023-09-03 DIAGNOSIS — O211 Hyperemesis gravidarum with metabolic disturbance: Secondary | ICD-10-CM | POA: Diagnosis present

## 2023-09-03 LAB — BASIC METABOLIC PANEL WITH GFR
Anion gap: 8 (ref 5–15)
BUN: <5 mg/dL — ABNORMAL LOW (ref 6–20)
CO2: 21 mmol/L — ABNORMAL LOW (ref 22–32)
Calcium: 7.9 mg/dL — ABNORMAL LOW (ref 8.9–10.3)
Chloride: 108 mmol/L (ref 98–111)
Creatinine, Ser: 0.33 mg/dL — ABNORMAL LOW (ref 0.44–1.00)
GFR, Estimated: >60 mL/min (ref >60)
Glucose, Bld: 78 mg/dL (ref 70–99)
Potassium: 3 mmol/L — ABNORMAL LOW (ref 3.5–5.1)
Sodium: 137 mmol/L (ref 135–145)

## 2023-09-03 LAB — CBC
HCT: 22.9 % — ABNORMAL LOW (ref 36.0–46.0)
Hemoglobin: 8.2 g/dL — ABNORMAL LOW (ref 12.0–15.0)
MCH: 33.7 pg (ref 26.0–34.0)
MCHC: 35.8 g/dL (ref 30.0–36.0)
MCV: 94.2 fL (ref 80.0–100.0)
Platelets: 153 K/uL (ref 150–400)
RBC: 2.43 MIL/uL — ABNORMAL LOW (ref 3.87–5.11)
RDW: 12 % (ref 11.5–15.5)
WBC: 5.5 K/uL (ref 4.0–10.5)
nRBC: 0 % (ref 0.0–0.2)

## 2023-09-03 LAB — MAGNESIUM: Magnesium: 1.5 mg/dL — ABNORMAL LOW (ref 1.7–2.4)

## 2023-09-03 LAB — PHOSPHORUS: Phosphorus: 2.1 mg/dL — ABNORMAL LOW (ref 2.5–4.6)

## 2023-09-03 MED ORDER — QUETIAPINE FUMARATE 25 MG PO TABS
25.0000 mg | ORAL_TABLET | Freq: Every day | ORAL | Status: DC
  Administered 2023-09-03 (×2): 25 mg via ORAL
  Filled 2023-09-03 (×4): qty 1

## 2023-09-03 MED ORDER — POTASSIUM PHOSPHATES 15 MMOLE/5ML IV SOLN
30.0000 mmol | Freq: Once | INTRAVENOUS | Status: AC
  Administered 2023-09-03: 30 mmol via INTRAVENOUS
  Filled 2023-09-03: qty 10

## 2023-09-03 MED ORDER — QUETIAPINE FUMARATE 25 MG PO TABS
25.0000 mg | ORAL_TABLET | ORAL | Status: AC
  Administered 2023-09-03: 25 mg via ORAL
  Filled 2023-09-03: qty 1

## 2023-09-03 MED ORDER — FERROUS SULFATE 325 (65 FE) MG PO TABS
325.0000 mg | ORAL_TABLET | ORAL | Status: DC
  Administered 2023-09-03: 325 mg via ORAL
  Filled 2023-09-03: qty 1

## 2023-09-03 MED ORDER — POLYETHYLENE GLYCOL 3350 17 G PO PACK
17.0000 g | PACK | Freq: Every day | ORAL | Status: DC | PRN
Start: 2023-09-03 — End: 2023-09-04
  Administered 2023-09-03: 17 g via ORAL
  Filled 2023-09-03: qty 1

## 2023-09-03 MED ORDER — POTASSIUM CHLORIDE 20 MEQ PO PACK
40.0000 meq | PACK | Freq: Once | ORAL | Status: AC
  Administered 2023-09-03: 40 meq via ORAL
  Filled 2023-09-03: qty 2

## 2023-09-03 MED ORDER — SENNOSIDES-DOCUSATE SODIUM 8.6-50 MG PO TABS
1.0000 | ORAL_TABLET | Freq: Every day | ORAL | Status: DC
  Administered 2023-09-03 – 2023-09-04 (×2): 1 via ORAL
  Filled 2023-09-03 (×2): qty 1

## 2023-09-03 MED ORDER — MAGNESIUM SULFATE 2 GM/50ML IV SOLN
2.0000 g | Freq: Once | INTRAVENOUS | Status: AC
  Administered 2023-09-03: 2 g via INTRAVENOUS
  Filled 2023-09-03: qty 50

## 2023-09-03 MED ORDER — LAMOTRIGINE 25 MG PO TABS
150.0000 mg | ORAL_TABLET | Freq: Every day | ORAL | Status: DC
  Administered 2023-09-03 (×2): 150 mg via ORAL
  Filled 2023-09-03 (×4): qty 2

## 2023-09-03 NOTE — Progress Notes (Addendum)
 ANTEPARTUM PROGRESS NOTE  Sarah Haney is a 28 y.o. G3P2002 at [redacted]w[redacted]d who is admitted for hyperemesis and electrolyte replacement.   Estimated Date of Delivery: 02/22/24  Length of Stay:  0 Days. Admitted 09/01/2023  Subjective: Tolerating PO intake now, starting to feel better though reportedly not 100%  She reports:  -active fetal movement -no leakage of fluid -no vaginal bleeding -no contractions  Vitals:  BP (!) 106/59 (BP Location: Left Arm)   Pulse 87   Temp 98.5 F (36.9 C) (Oral)   Resp 18   Ht 5' 5 (1.651 m)   Wt 61.9 kg   LMP 05/23/2023 (Approximate)   SpO2 100%   BMI 22.71 kg/m  Physical Examination: General:   alert, cooperative, and no distress  Skin:  normal  Neurologic:    Alert & oriented x 3  Lungs:   clear to auscultation bilaterally  Abdomen:  Soft, non-tender, gravid, c/w [redacted] week gestation   Pelvis:  Exam deferred.  FHT:  142 BPM  Extremities: : non-tender, symmetric, no edema bilaterally.  DTRs: 2+/2+     Results for orders placed or performed during the hospital encounter of 09/01/23 (from the past 48 hours)  Basic metabolic panel     Status: Abnormal   Collection Time: 09/01/23  6:20 PM  Result Value Ref Range   Sodium 137 135 - 145 mmol/L   Potassium 3.0 (L) 3.5 - 5.1 mmol/L   Chloride 108 98 - 111 mmol/L   CO2 21 (L) 22 - 32 mmol/L   Glucose, Bld 84 70 - 99 mg/dL    Comment: Glucose reference range applies only to samples taken after fasting for at least 8 hours.   BUN 9 6 - 20 mg/dL   Creatinine, Ser 9.49 0.44 - 1.00 mg/dL   Calcium  9.1 8.9 - 10.3 mg/dL   GFR, Estimated >39 >39 mL/min    Comment: (NOTE) Calculated using the CKD-EPI Creatinine Equation (2021)    Anion gap 8 5 - 15    Comment: Performed at [facility], [address] [place], [place] [address]  CBC     Status: Abnormal   Collection Time: 09/01/23  6:20 PM  Result Value Ref Range   WBC 9.8 4.0 - 10.5 K/uL   RBC 3.55 (L) 3.87 - 5.11 MIL/uL   Hemoglobin  11.9 (L) 12.0 - 15.0 g/dL   HCT 67.0 (L) 63.9 - 53.9 %   MCV 92.7 80.0 - 100.0 fL   MCH 33.5 26.0 - 34.0 pg   MCHC 36.2 (H) 30.0 - 36.0 g/dL   RDW 88.1 88.4 - 84.4 %   Platelets 243 150 - 400 K/uL   nRBC 0.0 0.0 - 0.2 %    Comment: Performed at [facility], [address], [place], [place] [address]  Troponin I (High Sensitivity)     Status: None   Collection Time: 09/01/23  6:20 PM  Result Value Ref Range   Troponin I (High Sensitivity) 10 <18 ng/L    Comment: (NOTE) Elevated high sensitivity troponin I (hsTnI) values and significant  changes across serial measurements may suggest ACS but many other  chronic and acute conditions are known to elevate hsTnI results.  Refer to the Links section for chest pain algorithms and additional  guidance. Performed at [facility], [address] [place], [place] [address]   hCG, quantitative, pregnancy     Status: Abnormal   Collection Time: 09/01/23  6:20 PM  Result Value Ref  Range   hCG, Beta Chain, Quant, S 33,536 (H) <5 mIU/mL    Comment:          GEST. AGE      CONC.  (mIU/mL)   <=1 WEEK        5 - 50     2 WEEKS       50 - 500     3 WEEKS       100 - 10,000     4 WEEKS     1,000 - 30,000     5 WEEKS     3,500 - 115,000   6-8 WEEKS     12,000 - 270,000    12 WEEKS     15,000 - 220,000        FEMALE AND NON-PREGNANT FEMALE:     LESS THAN 5 mIU/mL Performed at [facility], [address] [place], [place] [address]   TSH     Status: None   Collection Time: 09/02/23  4:55 AM  Result Value Ref Range   TSH 1.587 0.350 - 4.500 uIU/mL    Comment: Performed by a 3rd Generation assay with a functional sensitivity of <=0.01 uIU/mL. Performed at [facility], [address] [place], [place] [address]   CBC     Status: Abnormal   Collection Time: 09/02/23  4:55 AM  Result Value Ref Range   WBC 8.0 4.0 - 10.5 K/uL   RBC 3.21 (L) 3.87 - 5.11 MIL/uL   Hemoglobin 10.9 (L) 12.0 - 15.0 g/dL    HCT 70.0 (L) 63.9 - 46.0 %   MCV 93.1 80.0 - 100.0 fL   MCH 34.0 26.0 - 34.0 pg   MCHC 36.5 (H) 30.0 - 36.0 g/dL   RDW 87.9 88.4 - 84.4 %   Platelets 192 150 - 400 K/uL   nRBC 0.0 0.0 - 0.2 %    Comment: Performed at [facility], [address] [place], [place] [address]  Magnesium      Status: Abnormal   Collection Time: 09/02/23  4:55 AM  Result Value Ref Range   Magnesium  1.6 (L) 1.7 - 2.4 mg/dL    Comment: Performed at [facility], [address] [place], [place] [address]  Phosphorus     Status: Abnormal   Collection Time: 09/02/23  4:55 AM  Result Value Ref Range   Phosphorus 2.3 (L) 2.5 - 4.6 mg/dL    Comment: Performed at [facility], [address] [place], [place] [address]  Hepatic function panel     Status: Abnormal   Collection Time: 09/02/23  4:55 AM  Result Value Ref Range   Total Protein 5.1 (L) 6.5 - 8.1 g/dL   Albumin 2.8 (L) 3.5 - 5.0 g/dL   AST 11 (L) 15 - 41 U/L   ALT 7 0 - 44 U/L   Alkaline Phosphatase 46 38 - 126 U/L   Total Bilirubin 0.6 0.0 - 1.2 mg/dL   Bilirubin, Direct <9.8 0.0 - 0.2 mg/dL   Indirect Bilirubin NOT CALCULATED 0.3 - 0.9 mg/dL    Comment: Performed at [facility], [address] [place], [place] [address]  Potassium     Status: Abnormal   Collection Time: 09/02/23  4:55 AM  Result Value Ref Range   Potassium 2.7 (LL) 3.5 - 5.1 mmol/L    Comment: CRITICAL RESULT CALLED TO, READ BACK BY AND VERIFIED WITH DANA CHANDLER AT 0940 09/02/23.PMF Performed at [facility], [phone]  [address] [place], [place] [address]   Basic metabolic panel with GFR     Status: Abnormal   Collection Time: 09/03/23  5:22 AM  Result Value Ref Range   Sodium 137 135 - 145 mmol/L   Potassium 3.0 (L) 3.5 - 5.1 mmol/L   Chloride 108 98 - 111 mmol/L   CO2 21 (L) 22 - 32 mmol/L   Glucose, Bld 78 70 - 99 mg/dL    Comment: Glucose reference range applies only to samples taken after fasting for at least 8 hours.    BUN <5 (L) 6 - 20 mg/dL   Creatinine, Ser 9.66 (L) 0.44 - 1.00 mg/dL   Calcium  7.9 (L) 8.9 - 10.3 mg/dL   GFR, Estimated >39 >39 mL/min    Comment: (NOTE) Calculated using the CKD-EPI Creatinine Equation (2021)    Anion gap 8 5 - 15    Comment: Performed at [facility], [address] [place], [place] [address]  Magnesium      Status: Abnormal   Collection Time: 09/03/23  5:22 AM  Result Value Ref Range   Magnesium  1.5 (L) 1.7 - 2.4 mg/dL    Comment: Performed at [facility], [address] [place], [place] [address]  Phosphorus     Status: Abnormal   Collection Time: 09/03/23  5:22 AM  Result Value Ref Range   Phosphorus 2.1 (L) 2.5 - 4.6 mg/dL    Comment: Performed at [facility], [address] [place], [place] [address]  CBC     Status: Abnormal   Collection Time: 09/03/23  5:22 AM  Result Value Ref Range   WBC 5.5 4.0 - 10.5 K/uL   RBC 2.43 (L) 3.87 - 5.11 MIL/uL   Hemoglobin 8.2 (L) 12.0 - 15.0 g/dL   HCT 77.0 (L) 63.9 - 53.9 %   MCV 94.2 80.0 - 100.0 fL   MCH 33.7 26.0 - 34.0 pg   MCHC 35.8 30.0 - 36.0 g/dL   RDW 87.9 88.4 - 84.4 %   Platelets 153 150 - 400 K/uL   nRBC 0.0 0.0 - 0.2 %    Comment: Performed at [facility], [address] [place], [place] [address]    US  MAINE Limited Result Date: 09/01/2023 CLINICAL DATA:  106001 Syncope 106001 EXAM: LIMITED OBSTETRIC ULTRASOUND COMPARISON:  November 12, 2019 FINDINGS: Number of Fetuses: 1 Heart Rate:  147 bpm Movement: Present Presentation: Cephalic Placental Location: Mostly anterior and extending eccentrically along the posterolateral aspect. Previa: No Amniotic Fluid (Subjective):  Within normal limits. BPD: 2.9 cm 15 w  1 d MATERNAL FINDINGS: Cervix:  Appears closed. Uterus/Adnexae: No abnormality visualized. IMPRESSION: Single live intrauterine gestation in cephalic presentation with an average ultrasound age of 15 weeks, 1 day. Fetal heart rate of 147 beats per minute.  Electronically Signed   By: Rogelia Myers M.D.   On: 09/01/2023 19:38    Current scheduled medications  famotidine   20 mg Oral Daily   ferrous sulfate   325 mg Oral QODAY   folic acid   1 mg Oral Daily   Or   folic acid   1 mg Intravenous Daily   lamoTRIgine   150 mg Oral QHS   methylPREDNISolone   16 mg Oral Q breakfast   Followed by   NOREEN ON 09/07/2023] methylPREDNISolone   8 mg Oral Q breakfast   Followed by   NOREEN ON 09/14/2023] methylPREDNISolone   4 mg Oral Q breakfast   methylPREDNISolone   16 mg Oral Q1400   Followed by   [  START ON 09/05/2023] methylPREDNISolone   8 mg Oral Q1400   Followed by   NOREEN ON 09/08/2023] methylPREDNISolone   4 mg Oral Q1400   methylPREDNISolone   16 mg Oral QHS   Followed by   NOREEN ON 09/06/2023] methylPREDNISolone   8 mg Oral QHS   Followed by   NOREEN ON 09/09/2023] methylPREDNISolone   4 mg Oral QHS   ondansetron   4-8 mg Oral Q8H   Or   ondansetron  (ZOFRAN ) IV  4 mg Intravenous Q8H   prenatal multivitamin  1 tablet Oral Q1200   QUEtiapine   25 mg Oral QHS   senna-docusate  1 tablet Oral Daily   thiamine   100 mg Oral Daily   Or   thiamine   100 mg Intravenous Daily    I have reviewed the patient's current medications.  ASSESSMENT: Principal Problem:   Hyperemesis affecting pregnancy, antepartum    PLAN: Routine antepartum  - Inpatient for management of hyperemesis and electrolyte replacement  - Dr WENDI Penton MD notified of plan of care  - Continue routine antepartum care  - FHT q day  - Activity: No restrictions  Hyperemesis gravidarum  - Continue hyperemesis protocol  - Initially NPO. Has advanced to regular diet  - IV fluids with electrolyte replacement and then may saline lock  - Scheduled IV/PO antiemetics ordered with PRN IV/PO/PR antiemetics for breakthrough N/V  - Daily weight  - Q4 hour intake/output    Electrolyte imbalance secondary to N/V - Labs as follows              - Potassium: 3.0 -> 2.7 -> 3.0             -  Magnesium : 1.6 -> 1.5  - Phosphorus: 2.3->2.1 - Cardiac monitor discontinued  - Electrolyte replacement per pharmacy recommendations  - Recheck labs in AM    Anemia in pregnancy  - Likely dilutional effect from IV hydration  - Start iron supplementation every other day with ferrous sulfate   - Plan to recheck CBC in AM  POTS - No current medications  - Symptoms have improved with IV hydration  - Recommend compression stockings for activities that require prolong standing  - Frequent hydration with water  and electrolyte water    Disposition: remain inpatient for electrolyte replacement. Plan for discharge home tomorrow.   Therisa Pillow, CNM Certified Nurse Midwife [facility]

## 2023-09-03 NOTE — Plan of Care (Signed)
  Problem: Education: Goal: Knowledge of disease or condition will improve Outcome: Progressing Goal: Knowledge of the prescribed therapeutic regimen will improve Outcome: Progressing Goal: Individualized Educational Video(s) Outcome: Progressing   Problem: Clinical Measurements: Goal: Complications related to the disease process, condition or treatment will be avoided or minimized Outcome: Progressing   Problem: Education: Goal: Knowledge of disease or condition will improve Outcome: Progressing Goal: Knowledge of the prescribed therapeutic regimen will improve Outcome: Progressing   Problem: Bowel/Gastric: Goal: Occurences of nausea and/or vomiting will decrease Outcome: Progressing   Problem: Fluid Volume: Goal: Maintenance of adequate hydration will improve Outcome: Progressing   Problem: Nutritional: Goal: Achievement of adequate weight for body size and type will improve Outcome: Progressing   Problem: Education: Goal: Knowledge of General Education information will improve Description: Including pain rating scale, medication(s)/side effects and non-pharmacologic comfort measures Outcome: Progressing   Problem: Health Behavior/Discharge Planning: Goal: Ability to manage health-related needs will improve Outcome: Progressing   Problem: Clinical Measurements: Goal: Ability to maintain clinical measurements within normal limits will improve Outcome: Progressing Goal: Will remain free from infection Outcome: Progressing Goal: Diagnostic test results will improve Outcome: Progressing Goal: Respiratory complications will improve Outcome: Progressing Goal: Cardiovascular complication will be avoided Outcome: Progressing   Problem: Activity: Goal: Risk for activity intolerance will decrease Outcome: Progressing   Problem: Nutrition: Goal: Adequate nutrition will be maintained Outcome: Progressing   Problem: Coping: Goal: Level of anxiety will  decrease Outcome: Progressing   Problem: Elimination: Goal: Will not experience complications related to bowel motility Outcome: Progressing Goal: Will not experience complications related to urinary retention Outcome: Progressing   Problem: Pain Managment: Goal: General experience of comfort will improve and/or be controlled Outcome: Progressing   Problem: Safety: Goal: Ability to remain free from injury will improve Outcome: Progressing   Problem: Skin Integrity: Goal: Risk for impaired skin integrity will decrease Outcome: Progressing

## 2023-09-04 LAB — BASIC METABOLIC PANEL WITH GFR
Anion gap: 9 (ref 5–15)
BUN: 5 mg/dL — ABNORMAL LOW (ref 6–20)
CO2: 23 mmol/L (ref 22–32)
Calcium: 9 mg/dL (ref 8.9–10.3)
Chloride: 106 mmol/L (ref 98–111)
Creatinine, Ser: 0.58 mg/dL (ref 0.44–1.00)
GFR, Estimated: >60 mL/min (ref >60)
Glucose, Bld: 98 mg/dL (ref 70–99)
Potassium: 3.9 mmol/L (ref 3.5–5.1)
Sodium: 138 mmol/L (ref 135–145)

## 2023-09-04 LAB — PHOSPHORUS: Phosphorus: 3.8 mg/dL (ref 2.5–4.6)

## 2023-09-04 LAB — MAGNESIUM: Magnesium: 2 mg/dL (ref 1.7–2.4)

## 2023-09-04 MED ORDER — ACETAMINOPHEN 500 MG PO TABS: 1000.0000 mg | ORAL_TABLET | Freq: Four times a day (QID) | ORAL | Status: AC | PRN

## 2023-09-04 MED ORDER — POLYETHYLENE GLYCOL 3350 17 G PO PACK: 17.0000 g | PACK | Freq: Every day | ORAL | 0 refills | Status: DC | PRN

## 2023-09-04 MED ORDER — FAMOTIDINE 20 MG PO TABS: 20.0000 mg | ORAL_TABLET | Freq: Every day | ORAL | 0 refills | Status: DC

## 2023-09-04 MED ORDER — FERROUS SULFATE 325 (65 FE) MG PO TABS: 325.0000 mg | ORAL_TABLET | ORAL | Status: DC

## 2023-09-04 MED ORDER — SENNOSIDES-DOCUSATE SODIUM 8.6-50 MG PO TABS: 1.0000 | ORAL_TABLET | Freq: Every day | ORAL | Status: DC

## 2023-09-04 MED ORDER — CALCIUM CARBONATE ANTACID 500 MG PO CHEW: 2.0000 | CHEWABLE_TABLET | ORAL | 0 refills | Status: AC | PRN

## 2023-09-04 NOTE — Discharge Summary (Signed)
 Patient ID: Sarah Haney MRN: [id number] DOB/AGE: 31-Dec-1995 28 y.o.  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, Anti C antibody  Prenatal Procedures:   Consults: Pharmacy  Significant Diagnostic Studies:  Results for orders placed or performed during the hospital encounter of 09/01/23 (from the past week)  Basic metabolic panel   Collection Time: 09/01/23  6:20 PM  Result Value Ref Range   Sodium 137 135 - 145 mmol/L   Potassium 3.0 (L) 3.5 - 5.1 mmol/L   Chloride 108 98 - 111 mmol/L   CO2 21 (L) 22 - 32 mmol/L   Glucose, Bld 84 70 - 99 mg/dL   BUN 9 6 - 20 mg/dL   Creatinine, Ser 9.49 0.44 - 1.00 mg/dL   Calcium  9.1 8.9 - 10.3 mg/dL   GFR, Estimated >39 >39 mL/min   Anion gap 8 5 - 15  CBC   Collection Time: 09/01/23  6:20 PM  Result Value Ref Range   WBC 9.8 4.0 - 10.5 K/uL   RBC 3.55 (L) 3.87 - 5.11 MIL/uL   Hemoglobin 11.9 (L) 12.0 - 15.0 g/dL   HCT 67.0 (L) 63.9 - 53.9 %   MCV 92.7 80.0 - 100.0 fL   MCH 33.5 26.0 - 34.0 pg   MCHC 36.2 (H) 30.0 - 36.0 g/dL   RDW 88.1 88.4 - 84.4 %   Platelets 243 150 - 400 K/uL   nRBC 0.0 0.0 - 0.2 %  hCG, quantitative, pregnancy   Collection Time: 09/01/23  6:20 PM  Result Value Ref Range   hCG, Beta Chain, Quant, S 33,536 (H) <5 mIU/mL  Troponin I (High Sensitivity)   Collection Time: 09/01/23  6:20 PM  Result Value Ref Range   Troponin I (High Sensitivity) 10 <18 ng/L  TSH   Collection Time: 09/02/23  4:55 AM  Result Value Ref Range   TSH 1.587 0.350 - 4.500 uIU/mL  CBC   Collection Time: 09/02/23  4:55 AM   Result Value Ref Range   WBC 8.0 4.0 - 10.5 K/uL   RBC 3.21 (L) 3.87 - 5.11 MIL/uL   Hemoglobin 10.9 (L) 12.0 - 15.0 g/dL   HCT 70.0 (L) 63.9 - 53.9 %   MCV 93.1 80.0 - 100.0 fL   MCH 34.0 26.0 - 34.0 pg   MCHC 36.5 (H) 30.0 - 36.0 g/dL   RDW 87.9 88.4 - 84.4 %   Platelets 192 150 - 400 K/uL   nRBC 0.0 0.0 - 0.2 %  Magnesium    Collection Time: 09/02/23  4:55 AM  Result Value Ref Range   Magnesium  1.6 (L) 1.7 - 2.4 mg/dL  Phosphorus   Collection Time: 09/02/23  4:55 AM  Result Value Ref Range   Phosphorus 2.3 (L) 2.5 - 4.6 mg/dL  Hepatic function panel   Collection Time: 09/02/23  4:55 AM  Result Value Ref Range   Total Protein 5.1 (L) 6.5 - 8.1 g/dL   Albumin 2.8 (L) 3.5 - 5.0 g/dL   AST 11 (L) 15 - 41 U/L   ALT 7 0 - 44 U/L   Alkaline Phosphatase 46 38 - 126 U/L   Total Bilirubin 0.6 0.0 - 1.2 mg/dL   Bilirubin,  Direct <0.1 0.0 - 0.2 mg/dL   Indirect Bilirubin NOT CALCULATED 0.3 - 0.9 mg/dL  Potassium   Collection Time: 09/02/23  4:55 AM  Result Value Ref Range   Potassium 2.7 (LL) 3.5 - 5.1 mmol/L  Basic metabolic panel with GFR   Collection Time: 09/03/23  5:22 AM  Result Value Ref Range   Sodium 137 135 - 145 mmol/L   Potassium 3.0 (L) 3.5 - 5.1 mmol/L   Chloride 108 98 - 111 mmol/L   CO2 21 (L) 22 - 32 mmol/L   Glucose, Bld 78 70 - 99 mg/dL   BUN <5 (L) 6 - 20 mg/dL   Creatinine, Ser 9.66 (L) 0.44 - 1.00 mg/dL   Calcium  7.9 (L) 8.9 - 10.3 mg/dL   GFR, Estimated >39 >39 mL/min   Anion gap 8 5 - 15  Magnesium    Collection Time: 09/03/23  5:22 AM  Result Value Ref Range   Magnesium  1.5 (L) 1.7 - 2.4 mg/dL  Phosphorus   Collection Time: 09/03/23  5:22 AM  Result Value Ref Range   Phosphorus 2.1 (L) 2.5 - 4.6 mg/dL  CBC   Collection Time: 09/03/23  5:22 AM  Result Value Ref Range   WBC 5.5 4.0 - 10.5 K/uL   RBC 2.43 (L) 3.87 - 5.11 MIL/uL   Hemoglobin 8.2 (L) 12.0 - 15.0 g/dL   HCT 77.0 (L) 63.9 - 53.9 %   MCV 94.2 80.0 - 100.0 fL   MCH 33.7 26.0 -  34.0 pg   MCHC 35.8 30.0 - 36.0 g/dL   RDW 87.9 88.4 - 84.4 %   Platelets 153 150 - 400 K/uL   nRBC 0.0 0.0 - 0.2 %  Basic metabolic panel with GFR   Collection Time: 09/04/23  6:57 AM  Result Value Ref Range   Sodium 138 135 - 145 mmol/L   Potassium 3.9 3.5 - 5.1 mmol/L   Chloride 106 98 - 111 mmol/L   CO2 23 22 - 32 mmol/L   Glucose, Bld 98 70 - 99 mg/dL   BUN 5 (L) 6 - 20 mg/dL   Creatinine, Ser 9.41 0.44 - 1.00 mg/dL   Calcium  9.0 8.9 - 10.3 mg/dL   GFR, Estimated >39 >39 mL/min   Anion gap 9 5 - 15  Magnesium    Collection Time: 09/04/23  6:57 AM  Result Value Ref Range   Magnesium  2.0 1.7 - 2.4 mg/dL  Phosphorus   Collection Time: 09/04/23  6:57 AM  Result Value Ref Range   Phosphorus 3.8 2.5 - 4.6 mg/dL  Results for orders placed or performed during the hospital encounter of 08/30/23 (from the past week)  Urinalysis, Routine w reflex microscopic -Urine, Clean Catch   Collection Time: 08/30/23  2:46 PM  Result Value Ref Range   Color, Urine YELLOW (A) YELLOW   APPearance HAZY (A) CLEAR   Specific Gravity, Urine 1.014 1.005 - 1.030   pH 6.0 5.0 - 8.0   Glucose, UA NEGATIVE NEGATIVE mg/dL   Hgb urine dipstick MODERATE (A) NEGATIVE   Bilirubin Urine NEGATIVE NEGATIVE   Ketones, ur 20 (A) NEGATIVE mg/dL   Protein, ur 30 (A) NEGATIVE mg/dL   Nitrite NEGATIVE NEGATIVE   Leukocytes,Ua SMALL (A) NEGATIVE   RBC / HPF 21-50 0 - 5 RBC/hpf   WBC, UA 0-5 0 - 5 WBC/hpf   Bacteria, UA MANY (A) NONE SEEN   Squamous Epithelial / HPF 11-20 0 - 5 /HPF   Mucus PRESENT   Comprehensive  metabolic panel   Collection Time: 08/30/23  2:47 PM  Result Value Ref Range   Sodium 135 135 - 145 mmol/L   Potassium 2.8 (L) 3.5 - 5.1 mmol/L   Chloride 103 98 - 111 mmol/L   CO2 21 (L) 22 - 32 mmol/L   Glucose, Bld 100 (H) 70 - 99 mg/dL   BUN 9 6 - 20 mg/dL   Creatinine, Ser 9.41 0.44 - 1.00 mg/dL   Calcium  8.9 8.9 - 10.3 mg/dL   Total Protein 6.6 6.5 - 8.1 g/dL   Albumin 3.8 3.5 - 5.0 g/dL    AST 17 15 - 41 U/L   ALT 9 0 - 44 U/L   Alkaline Phosphatase 63 38 - 126 U/L   Total Bilirubin 0.6 0.0 - 1.2 mg/dL   GFR, Estimated >39 >39 mL/min   Anion gap 11 5 - 15  CBC   Collection Time: 08/30/23  2:47 PM  Result Value Ref Range   WBC 10.8 (H) 4.0 - 10.5 K/uL   RBC 3.79 (L) 3.87 - 5.11 MIL/uL   Hemoglobin 12.7 12.0 - 15.0 g/dL   HCT 65.1 (L) 63.9 - 53.9 %   MCV 91.8 80.0 - 100.0 fL   MCH 33.5 26.0 - 34.0 pg   MCHC 36.5 (H) 30.0 - 36.0 g/dL   RDW 88.1 88.4 - 84.4 %   Platelets 250 150 - 400 K/uL   nRBC 0.0 0.0 - 0.2 %  Lipase, blood   Collection Time: 08/30/23  2:47 PM  Result Value Ref Range   Lipase 30 11 - 51 U/L  Magnesium    Collection Time: 08/30/23  2:47 PM  Result Value Ref Range   Magnesium  1.9 1.7 - 2.4 mg/dL  POC urine preg, ED   Collection Time: 08/30/23  2:55 PM  Result Value Ref Range   Preg Test, Ur Positive (A) Negative    Treatments: IV hydration, steroids: Medrol ,  IV antiemetics and limited ob ultrasound  Hospital Course:  This is a 28 year old G3P2002 who was admitted for hyperemesis and electrolyte replacement at 15 weeks and 4 days of gestation.  There was no fluid leakage or bleeding observed.  She received treatment that included IV hydration, antiemetics, and electrolytes, along with a limited obstetric ultrasound.  Initially, she was kept NPO and her diet was advanced as tolerated.  She has since been able to tolerate oral intake, and her lab results have shown improvement.  She will be discharged with oral iron supplements for anemia and oral antiemetics.  She has been assessed as stable for discharge to home with a plan for outpatient follow-up.  A follow-up appointment has been scheduled for 09/13/23 at 14:00.  Discharge Physical Exam:  BP 109/76 (BP Location: Left Arm)   Pulse 73   Temp 98 F (36.7 C) (Oral)   Resp 20   Ht 5' 5 (1.651 m)   Wt 59.1 kg   LMP 05/23/2023 (Approximate)   SpO2 99%   BMI 21.70 kg/m   General:  NAD CV: RRR Pulm: CTABL, nl effort ABD: s/nd/nt, gravid DVT Evaluation: LE non-ttp, no evidence of DVT on exam.  NST: FHR  130 bpm    Discharge Condition: Stable  Disposition: Discharge disposition: 01-Home or Self Care        Allergies as of 09/04/2023   No Known Allergies      Medication List     TAKE these medications    acetaminophen  500 MG tablet Commonly known as: TYLENOL  Take  2 tablets (1,000 mg total) by mouth every 6 (six) hours as needed.   calcium  carbonate 500 MG chewable tablet Commonly known as: TUMS - dosed in mg elemental calcium  Chew 2 tablets (400 mg of elemental calcium  total) by mouth every 4 (four) hours as needed for indigestion.   famotidine  20 MG tablet Commonly known as: PEPCID  Take 1 tablet (20 mg total) by mouth daily. Start taking on: September 05, 2023   ferrous sulfate  325 (65 FE) MG tablet Take 1 tablet (325 mg total) by mouth every other day. Start taking on: September 05, 2023   lamoTRIgine  150 MG tablet Commonly known as: LAMICTAL  Take 150 mg by mouth daily.   ondansetron  8 MG tablet Commonly known as: ZOFRAN  Take 8 mg by mouth.  Take 1 tablet (8 mg total) by mouth every 8 (eight) hours as needed for Nausea   PNV-DHA 27-0.6-0.4-300 MG Caps Take 1 capsule by mouth daily.   polyethylene glycol 17 g packet Commonly known as: MIRALAX  / GLYCOLAX  Take 17 g by mouth daily as needed for mild constipation or moderate constipation.   promethazine  25 MG tablet Commonly known as: PHENERGAN  Take 25 mg by mouth.  Take 1 tablet (25 mg total) by mouth every 6 (six) hours as needed for Nausea   QUEtiapine  25 MG tablet Commonly known as: SEROQUEL  Take 25 mg by mouth at bedtime.   senna-docusate 8.6-50 MG tablet Commonly known as: Senokot-S Take 1 tablet by mouth daily. Start taking on: September 05, 2023        Follow-up Information     [facility]. Go to.   Why: appointment  on 09/13/23 @ 1400 with Dr Leonce Pass  information: [address] [place]  [address] [phone]                Signed:  Bobbette Brunswick, CNM 09/04/2023 5:11 PM

## 2023-09-04 NOTE — Progress Notes (Signed)
Pt discharged home.  Discharge instructions, prescriptions and follow up appointment given to and reviewed with pt.  Pt verbalized understanding.  Escorted by staff. 

## 2023-09-05 ENCOUNTER — Encounter

## 2023-09-08 ENCOUNTER — Other Ambulatory Visit: Payer: Self-pay | Admitting: *Deleted

## 2023-09-10 ENCOUNTER — Other Ambulatory Visit: Payer: Self-pay | Admitting: *Deleted

## 2023-09-11 ENCOUNTER — Ambulatory Visit
Admit: 2023-09-11 | Discharge: 2023-09-11 | Disposition: A | Discharge status: Home / Self Care | Source: Ambulatory Visit | Attending: Certified Nurse Midwife | Admitting: Certified Nurse Midwife

## 2023-09-11 DIAGNOSIS — O211 Hyperemesis gravidarum with metabolic disturbance: Secondary | ICD-10-CM | POA: Insufficient documentation

## 2023-09-11 DIAGNOSIS — Z79899 Other long term (current) drug therapy: Secondary | ICD-10-CM | POA: Diagnosis not present

## 2023-09-11 DIAGNOSIS — R55 Syncope and collapse: Secondary | ICD-10-CM | POA: Diagnosis not present

## 2023-09-11 DIAGNOSIS — E860 Dehydration: Secondary | ICD-10-CM | POA: Diagnosis present

## 2023-09-11 DIAGNOSIS — Z3A15 15 weeks gestation of pregnancy: Secondary | ICD-10-CM | POA: Diagnosis not present

## 2023-09-11 DIAGNOSIS — O0992 Supervision of high risk pregnancy, unspecified, second trimester: Secondary | ICD-10-CM | POA: Insufficient documentation

## 2023-09-11 MED ORDER — THIAMINE HCL 100 MG/ML IJ SOLN
INTRAVENOUS | Status: DC
  Filled 2023-09-11 (×24): qty 1000

## 2023-09-12 ENCOUNTER — Ambulatory Visit: Attending: Medical | Admitting: Medical

## 2023-09-12 ENCOUNTER — Encounter: Payer: Self-pay | Admitting: Medical

## 2023-09-12 VITALS — BP 99/68 | Temp 78.0°F | Ht 65.0 in | Wt 134.8 lb

## 2023-09-12 DIAGNOSIS — G901 Familial dysautonomia [Riley-Day]: Secondary | ICD-10-CM | POA: Diagnosis not present

## 2023-09-12 DIAGNOSIS — Z3A16 16 weeks gestation of pregnancy: Secondary | ICD-10-CM

## 2023-09-12 DIAGNOSIS — G90A Postural orthostatic tachycardia syndrome (POTS): Secondary | ICD-10-CM | POA: Diagnosis not present

## 2023-09-12 DIAGNOSIS — I951 Orthostatic hypotension: Secondary | ICD-10-CM | POA: Diagnosis not present

## 2023-09-12 NOTE — Progress Notes (Signed)
 Cardiology Office Note   Date:  09/12/2023  ID:  Sarah Haney, DOB [birth year], MRN [id number] PCP: Cyrus Selinda Moose, PA-C  [facility] Providers Cardiologist:  None Electrophysiologist:  OLE ONEIDA HOLTS, MD   History of Present Illness Sarah Haney is a 28 y.o. female with a history of syncope, dysautonomia, POTS, orthostatic hypotension who presents for follow-up.  Patient was first seen in 2023 by Dr. Gollan for tachycardia and chest pain.  Echo in 2023 showed EF 60 to 65%, mild MR.  Heart monitor showed normal sinus rhythm average heart rate of 94 bpm, rare ectopy.  Triggered events associated with sinus rhythm, sinus tachycardia.  Patient was seen by Dr. HOLTS May 2025 reporting dysautonomia suspected POTS and orthostatic hypotension. It was recommended she liberalize salt intake, increase water  intake and incorporate aerobic exercise.  The patient was recently admitted for hyperemesis and electrolyte replacement at 15 weeks and 14 days of gestation.  There was no fluid leakage or bleeding observed.  She was given IV hydration, antiemetics, and electrolytes.  Today, the patient reports she was having elevated heart rates went to the ER twice before she was admitted. She is feeling better, but not still not great.  Patient is still working at [employer] and is on her feet most of the shift.  When she is walking around heart rate goes up and she experiences shortness of breath.  When she sits heart rate is able to improve. She is drinking more electrolytes than water  and she has liberalized her salt intake. She is trying to eat more as her appetite was previously low. She is currently 16 1/[redacted] weeks pregnant and has an appointment with OBGYN tomorrow. She denies chest pain of LLE.   Studies Reviewed EKG Interpretation Date/Time:  Tuesday September 12 2023 09:32:33 EDT Ventricular Rate:  78 PR Interval:  156 QRS Duration:  74 QT Interval:  368 QTC Calculation: 419 R  Axis:   64  Text Interpretation: Normal sinus rhythm Low voltage QRS When compared with ECG of 01-Sep-2023 18:23, No significant change was found Confirmed by Franchester, Elverta Dimiceli ([id number]) on 09/12/2023 9:41:38 AM    Heart monitor 2023 Event monitor Patch Wear Time:  14 days and 0 hours (2023-03-05T13:06:58-0500 to 2023-03-19T14:07:03-398)   Normal sinus rhythm Patient had a min HR of 49 bpm, max HR of 197 bpm, and avg HR of 94 bpm.  Isolated SVEs were rare (<1.0%), and no SVE Couplets or SVE Triplets were present.  Isolated VEs were rare (<1.0%), VE Couplets were rare (<1.0%), and no VE Triplets were present.    Triggered events associated with sinus rhythm, sinus tachycardia   Echo 2023 1. Left ventricular ejection fraction, by estimation, is 60 to 65%. The  left ventricle has normal function. The left ventricle has no regional  wall motion abnormalities. Left ventricular diastolic parameters were  normal. The average left ventricular  global longitudinal strain is -18.0 %. The global longitudinal strain is  normal.   2. Right ventricular systolic function is normal. The right ventricular  size is normal.   3. The mitral valve is normal in structure. Mild mitral valve  regurgitation. No evidence of mitral stenosis.   4. The aortic valve is normal in structure. Aortic valve regurgitation is  trivial. No aortic stenosis is present.   5. The inferior vena cava is normal in size with greater than 50%  respiratory variability, suggesting right atrial pressure of 3 mmHg.  Physical Exam VS:  BP 99/68   Temp (!) 78 F (25.6 C)   Ht 5' 5 (1.651 m)   Wt 134 lb 12.8 oz (61.1 kg)   LMP 05/23/2023 (Approximate)   SpO2 99%   BMI 22.43 kg/m   Orthostatic VS for the past 24 hrs (Last 3 readings):  BP- Lying Pulse- Lying BP- Sitting Pulse- Sitting BP- Standing at 0 minutes Pulse- Standing at 0 minutes BP- Standing at 3 minutes Pulse- Standing at 3 minutes  09/12/23 0937 97/65 84 111/70  88 99/74 137 106/74 142      Wt Readings from Last 3 Encounters:  09/12/23 134 lb 12.8 oz (61.1 kg)  09/04/23 130 lb 6.4 oz (59.1 kg)  08/30/23 138 lb (62.6 kg)    GEN: Well nourished, well developed in no acute distress NECK: No JVD; No carotid bruits CARDIAC: RRR, no murmurs, rubs, gallops RESPIRATORY:  Clear to auscultation without rales, wheezing or rhonchi  ABDOMEN: Soft, non-tender, non-distended EXTREMITIES:  No edema; No deformity   ASSESSMENT AND PLAN  Dysautonomia POTS Orthostatics hypotension Pregnancy at [redacted] weeks gestation The patient was recently admitted for hyperemesis hypokalemia and hypomagnesemia. She was supplemented and given IVF until normalization of electrolytes. Since the hospitalization she has been feeling better, but still not great. She sees Tax inspector, recommend re-check Mag and K. She is liberalizing salt intake and drinking more electrolytes than water , which I agree with. She is eating 1.5 meals a day. She is working 18-20 hours a week at [employer]. Discussed a work letter asking for breaks every hour, she will speak to her manager and get back to us . She is unable to do any exercise given pregnancy. I will refer her to the POTS clinic at [facility] as this is will be a chronic issues. Follow-up with EP in the interim.  Orthostatics today show stable BP, but elevated HR from sitting to standing.        Dispo: Follow-up with EP in 3 months  Signed, Orra Nolde VEAR Fishman, PA-C

## 2023-09-12 NOTE — Patient Instructions (Signed)
 Medication Instructions:  Your physician recommends that you continue on your current medications as directed. Please refer to the Current Medication list given to you today.   *If you need a refill on your cardiac medications before your next appointment, please call your pharmacy*  Lab Work: No labs ordered today  If you have labs (blood work) drawn today and your tests are completely normal, you will receive your results only by: MyChart Message (if you have MyChart) OR A paper copy in the mail If you have any lab test that is abnormal or we need to change your treatment, we will call you to review the results.  Testing/Procedures: No test ordered today   Follow-Up: At [facility], you and your health needs are our priority.  As part of our continuing mission to provide you with exceptional heart care, our providers are all part of one team.  This team includes your primary Cardiologist (physician) and Advanced Practice Providers or APPs (Physician Assistants and Nurse Practitioners) who all work together to provide you with the care you need, when you need it.  Your next appointment:   3 month(s)  Provider:   Ole Holts

## 2023-09-13 ENCOUNTER — Other Ambulatory Visit: Payer: Self-pay | Admitting: Obstetrics and Gynecology

## 2023-09-13 DIAGNOSIS — O26899 Other specified pregnancy related conditions, unspecified trimester: Secondary | ICD-10-CM | POA: Insufficient documentation

## 2023-09-13 DIAGNOSIS — G90A Postural orthostatic tachycardia syndrome (POTS): Secondary | ICD-10-CM | POA: Insufficient documentation

## 2023-10-05 ENCOUNTER — Telehealth: Payer: Self-pay

## 2023-10-09 ENCOUNTER — Ambulatory Visit
Admission: RE | Admit: 2023-10-09 | Discharge: 2023-10-09 | Disposition: A | Discharge status: Home / Self Care | Source: Ambulatory Visit | Attending: Obstetrics | Admitting: Obstetrics

## 2023-10-09 DIAGNOSIS — O361921 Maternal care for other isoimmunization, second trimester, fetus 1: Secondary | ICD-10-CM | POA: Diagnosis present

## 2023-10-09 DIAGNOSIS — O211 Hyperemesis gravidarum with metabolic disturbance: Secondary | ICD-10-CM | POA: Insufficient documentation

## 2023-10-09 DIAGNOSIS — O0992 Supervision of high risk pregnancy, unspecified, second trimester: Secondary | ICD-10-CM | POA: Insufficient documentation

## 2023-10-09 DIAGNOSIS — E860 Dehydration: Secondary | ICD-10-CM | POA: Diagnosis present

## 2023-10-09 DIAGNOSIS — Z3A20 20 weeks gestation of pregnancy: Secondary | ICD-10-CM | POA: Diagnosis not present

## 2023-10-09 DIAGNOSIS — R55 Syncope and collapse: Secondary | ICD-10-CM | POA: Diagnosis present

## 2023-10-09 MED ORDER — THIAMINE HCL 100 MG/ML IJ SOLN
INTRAVENOUS | Status: DC
  Filled 2023-10-09: qty 1000

## 2023-10-10 MED FILL — Sodium Chloride IV Soln 0.9%: INTRAVENOUS | Qty: 1000 | Status: AC

## 2023-10-11 ENCOUNTER — Ambulatory Visit: Attending: Certified Nurse Midwife | Admitting: Obstetrics and Gynecology

## 2023-10-11 ENCOUNTER — Ambulatory Visit: Payer: Self-pay | Admitting: Maternal & Fetal Medicine

## 2023-10-11 DIAGNOSIS — O361920 Maternal care for other isoimmunization, second trimester, not applicable or unspecified: Secondary | ICD-10-CM

## 2023-10-11 DIAGNOSIS — Z3A19 19 weeks gestation of pregnancy: Secondary | ICD-10-CM | POA: Diagnosis not present

## 2023-10-11 NOTE — Addendum Note (Signed)
 Addended by: MALVINA SOBER F on: 10/11/2023 03:33 PM   Modules accepted: Level of Service

## 2023-10-11 NOTE — Progress Notes (Signed)
 Virtual Visit via Video Note  I connected with Sarah Haney on 10/11/23 at  9:00 AM EDT by a video enabled telemedicine application and verified that I am speaking with the correct person using two identifiers.  Location: Patient: Home ([place], [place]) Provider: [facility] Length of Consultation: 30 minute virtual visit  Sarah Haney  was referred to [facility] for genetic counseling to review prenatal screening and testing options due to maternal Anti C antibodies detected on prenatal labs.  The patient was present on this virtual visit alone.  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 Sarah Haney reports a history of blood transfusions after the birth of her first child.  We reviewed the genetics of the C/c antigen and the mode of inheritance. We discussed the options to determine fetal antigen status, such as paternal genotyping, UNITY NIPS with fetal antigen risk assessment, and amniocentesis. The technical aspects, benefits, risks, and limitations of each were reviewed. We discussed that the need and the frequency of prenatal surveillance through MCA Dopplers will depend on the type of test performed and the subsequent results. We reviewed NIPS is only a screen and may return with false positives or false negatives; however, the lab reports a sensitivity and specificity of 100% for the Kell antigen. After reviewing her options, the patient elected to proceed with UNITY NIPS.  Family history and pregnancy history: We obtained a detailed family history and pregnancy history. This is the third pregnancy for Sarah Haney, the first with her current partner.  She has two healthy daughters and reported no exposures to alcohol, tobacco or recreational drugs in this pregnancy.  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  We reviewed that Sarah Haney (EDS) is the term for a group of connective tissue disorders that affect the skin, bones, blood vessels, and other organs/tissues throughout the body. There are numerous types of EDS, with the hypermobile and classical forms of the condition being the most common. At this time, there is no definitive testing is available for hEDS.    Individuals with EDS often have certain features in common, such as joint hypermobility, hypotonia, delayed motor skills, joint dislocations, chronic pain, skin abnormalities, easy bruising, and abnormal scarring. Each subtype is associated with additional distinct features. Hypermobile EDS (hEDS) has a complex set of clinical diagnostic criteria. These criteria include joint hypermobility, signs of faulty connective tissue throughout the body (e.g. skin features, hernias, prolapses), a family history of the condition, and musculoskeletal problems (e.g. long-term pain, dislocations). There are many associated symptoms that are not part of the formal criteria for hEDS, such as postural orthostatic tachycardia Haney (Sarah Haney), digestive disorders, pelvic and bladder dysfunction, and anxiety disorders. Many individuals do not fully meet diagnostic criteria for hEDS but experience hypermobility that interferes with their life in some way. After other possible conditions are excluded, a diagnosis of generalized hypermobility spectrum disorder may be made for these individuals.    EDS is associated with at least 20 different genes; however, there has not yet been a genetic cause identified for hEDS. Many forms of EDS, including hEDS, are inherited in an autosomal dominant fashion. This means that individuals with EDS symptoms are often seen in every generation of a family. When an individual has EDS following an  autosomal dominant pattern of inheritance, they have a 1 in 2 (50%) chance of passing it on to each of  their children.    Sarah Haney has not had an evaluation by a medical geneticist for EDS, and most genetics clinics in [place] are not currently evaluating patients for the hEDS form. Sarah Haney is on a waiting list to see a cardiologist at [facility] who specializes in Sarah Haney and will plan to talk with them about her history. She has seen a cardiologist locally who found no concerns and had an echocardiogram in 2023.  Sarah Haney mother has a history of Afib with a pacemaker and her father had a heart attack in his 10s  The family history is otherwise unremarkable for birth defects, developmental delays, recurrent pregnancy loss or known genetic conditions.  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  These results showed the expected representation of DNA for chromosomes 13, 18, 21, X and Y.  See that report for details and residual risk information. Carrier screening: Inheritest carrier screening for cystic fibrosis and spinal muscular atrophy was also low risk.  No changes were found in these genes to suggest that Sarah Haney is a carrier.  These results greatly reduce, but cannot eliminate, the chance to have a child with these conditions.  In addition, hemoglobin fractionation for beta hemoglobinopathies showed no variants.  The level of fetal hemoglobin (F) was slightly increased, which is not uncommon in the second trimester of pregnancy.   Plan of Care: Sarah Haney expressed that she would prefer the Unity screening to be drawn at her visit at [facility] in [place] on 10/18/23. Anatomy ultrasound and MFM consultation will done be at that time. She will continue to wait for a visit with [facility]ologist who specializes in Sarah Haney.   Sarah Haney was encouraged to call with questions or concerns.  We can be contacted at [phone].   In total, 55 minutes were spent on the date of the encounter in service to the  patient including preparation, record review, face-to-face consultation, documentation and care coordination.  Sarah Haney

## 2023-10-12 ENCOUNTER — Ambulatory Visit: Admitting: Cardiology

## 2023-10-16 ENCOUNTER — Ambulatory Visit
Admission: RE | Admit: 2023-10-16 | Discharge: 2023-10-16 | Disposition: A | Discharge status: Home / Self Care | Source: Ambulatory Visit | Attending: Obstetrics | Admitting: Obstetrics

## 2023-10-16 DIAGNOSIS — O361920 Maternal care for other isoimmunization, second trimester, not applicable or unspecified: Secondary | ICD-10-CM | POA: Diagnosis present

## 2023-10-16 DIAGNOSIS — Z3A21 21 weeks gestation of pregnancy: Secondary | ICD-10-CM | POA: Diagnosis not present

## 2023-10-16 MED ORDER — THIAMINE HCL 100 MG/ML IJ SOLN
INTRAVENOUS | Status: DC
  Filled 2023-10-16: qty 1000

## 2023-10-18 ENCOUNTER — Ambulatory Visit: Attending: Certified Nurse Midwife

## 2023-10-18 ENCOUNTER — Encounter: Payer: Self-pay | Admitting: Obstetrics and Gynecology

## 2023-10-18 ENCOUNTER — Other Ambulatory Visit: Payer: Self-pay | Admitting: *Deleted

## 2023-10-18 ENCOUNTER — Ambulatory Visit

## 2023-10-18 ENCOUNTER — Ambulatory Visit (HOSPITAL_BASED_OUTPATIENT_CLINIC_OR_DEPARTMENT_OTHER): Admitting: Maternal & Fetal Medicine

## 2023-10-18 VITALS — BP 109/56 | HR 71

## 2023-10-18 DIAGNOSIS — O0991 Supervision of high risk pregnancy, unspecified, first trimester: Secondary | ICD-10-CM | POA: Insufficient documentation

## 2023-10-18 DIAGNOSIS — O09891 Supervision of other high risk pregnancies, first trimester: Secondary | ICD-10-CM

## 2023-10-18 DIAGNOSIS — Z3A21 21 weeks gestation of pregnancy: Secondary | ICD-10-CM | POA: Insufficient documentation

## 2023-10-18 DIAGNOSIS — O360120 Maternal care for anti-D [Rh] antibodies, second trimester, not applicable or unspecified: Secondary | ICD-10-CM

## 2023-10-18 DIAGNOSIS — O0992 Supervision of high risk pregnancy, unspecified, second trimester: Secondary | ICD-10-CM | POA: Insufficient documentation

## 2023-10-18 DIAGNOSIS — Z362 Encounter for other antenatal screening follow-up: Secondary | ICD-10-CM
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
## 2023-10-24 ENCOUNTER — Ambulatory Visit: Admission: RE | Admit: 2023-10-24 | Source: Ambulatory Visit

## 2023-10-30 ENCOUNTER — Telehealth: Payer: Self-pay | Admitting: Obstetrics and Gynecology

## 2023-10-30 NOTE — Telephone Encounter (Signed)
 I spoke with Sarah Haney to review the results of the Unity Fetal Antigen testing for big C and D.  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  Therefore, follow up testing with MCA dopplers and plan of care with the MFM is recommended. We scheduled Sarah Haney for an ultrasound for MCA dopplers at [facility] [place] on Wednesday, 9/10, at 2:30pm.  [name], MS, CGC

## 2023-10-31 ENCOUNTER — Other Ambulatory Visit: Payer: Self-pay

## 2023-10-31 DIAGNOSIS — Z6791 Unspecified blood type, Rh negative: Secondary | ICD-10-CM

## 2023-10-31 NOTE — Progress Notes (Unsigned)
 Marland Kitchen  mfm

## 2023-11-01 ENCOUNTER — Other Ambulatory Visit: Payer: Self-pay

## 2023-11-01 ENCOUNTER — Ambulatory Visit (HOSPITAL_BASED_OUTPATIENT_CLINIC_OR_DEPARTMENT_OTHER): Admitting: Maternal & Fetal Medicine

## 2023-11-01 ENCOUNTER — Ambulatory Visit

## 2023-11-01 ENCOUNTER — Ambulatory Visit: Attending: Maternal & Fetal Medicine

## 2023-11-01 VITALS — BP 119/75 | HR 84

## 2023-11-01 DIAGNOSIS — Z3A21 21 weeks gestation of pregnancy: Secondary | ICD-10-CM | POA: Diagnosis not present

## 2023-11-01 DIAGNOSIS — O0992 Supervision of high risk pregnancy, unspecified, second trimester: Secondary | ICD-10-CM | POA: Insufficient documentation

## 2023-11-01 DIAGNOSIS — Z6791 Unspecified blood type, Rh negative: Secondary | ICD-10-CM

## 2023-11-01 DIAGNOSIS — O361920 Maternal care for other isoimmunization, second trimester, not applicable or unspecified: Secondary | ICD-10-CM

## 2023-11-01 DIAGNOSIS — Z148 Genetic carrier of other disease: Secondary | ICD-10-CM | POA: Diagnosis not present

## 2023-11-01 DIAGNOSIS — Z3A23 23 weeks gestation of pregnancy: Secondary | ICD-10-CM

## 2023-11-01 DIAGNOSIS — O360920 Maternal care for other rhesus isoimmunization, second trimester, not applicable or unspecified: Secondary | ICD-10-CM

## 2023-11-01 DIAGNOSIS — O0991 Supervision of high risk pregnancy, unspecified, first trimester: Secondary | ICD-10-CM

## 2023-11-01 DIAGNOSIS — O360120 Maternal care for anti-D [Rh] antibodies, second trimester, not applicable or unspecified: Secondary | ICD-10-CM | POA: Diagnosis not present

## 2023-11-01 DIAGNOSIS — O26899 Other specified pregnancy related conditions, unspecified trimester: Secondary | ICD-10-CM

## 2023-11-01 NOTE — Progress Notes (Signed)
 After review, MFM consult with provider is not indicated for today  Sarah Nathanel Pipe, MD 11/01/2023 3:52 PM  [facility]

## 2023-11-01 NOTE — Progress Notes (Unsigned)
 Marland Kitchen  mfm

## 2023-11-08 ENCOUNTER — Other Ambulatory Visit: Payer: Self-pay

## 2023-11-08 ENCOUNTER — Ambulatory Visit: Attending: Maternal & Fetal Medicine

## 2023-11-08 ENCOUNTER — Ambulatory Visit (HOSPITAL_BASED_OUTPATIENT_CLINIC_OR_DEPARTMENT_OTHER): Admitting: Maternal & Fetal Medicine

## 2023-11-08 VITALS — BP 122/64 | HR 80

## 2023-11-08 DIAGNOSIS — Z6791 Unspecified blood type, Rh negative: Secondary | ICD-10-CM

## 2023-11-08 DIAGNOSIS — O99112 Other diseases of the blood and blood-forming organs and certain disorders involving the immune mechanism complicating pregnancy, second trimester: Secondary | ICD-10-CM | POA: Insufficient documentation

## 2023-11-08 DIAGNOSIS — O361920 Maternal care for other isoimmunization, second trimester, not applicable or unspecified: Secondary | ICD-10-CM | POA: Insufficient documentation

## 2023-11-08 DIAGNOSIS — O26899 Other specified pregnancy related conditions, unspecified trimester: Secondary | ICD-10-CM

## 2023-11-08 DIAGNOSIS — O99119 Other diseases of the blood and blood-forming organs and certain disorders involving the immune mechanism complicating pregnancy, unspecified trimester: Secondary | ICD-10-CM | POA: Diagnosis present

## 2023-11-08 DIAGNOSIS — O322XX0 Maternal care for transverse and oblique lie, not applicable or unspecified: Secondary | ICD-10-CM | POA: Diagnosis not present

## 2023-11-08 DIAGNOSIS — Z3689 Encounter for other specified antenatal screening: Secondary | ICD-10-CM | POA: Diagnosis not present

## 2023-11-08 DIAGNOSIS — Z3A24 24 weeks gestation of pregnancy: Secondary | ICD-10-CM | POA: Diagnosis not present

## 2023-11-08 DIAGNOSIS — D6861 Antiphospholipid syndrome: Secondary | ICD-10-CM | POA: Insufficient documentation

## 2023-11-08 DIAGNOSIS — O360120 Maternal care for anti-D [Rh] antibodies, second trimester, not applicable or unspecified: Secondary | ICD-10-CM | POA: Diagnosis not present

## 2023-11-08 NOTE — Progress Notes (Signed)
 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 G3P2002 at [redacted]w[redacted]d here for ultrasound and consultation. Sarah Haney is doing well today with no acute concerns. 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  The fetus has the large C antigen. She surreptitiously had MCA Dopplers done at her previous visit and they were found to be abnormal.  There were periods of absent end-diastolic flow with occasional flow reversal but only rarely.  I discussed this with Dr. Kizzie who saw her at that time.  There is no consistent explanation for this finding but may have been due to technique error by using too much probe pressure or having the gait too far away from the origin of the middle cerebral artery. It has also been associated with poor fetal outcomes such as FGR and poor placental oxygenation. Today's waveform show normal end-diastolic flow.  Most of the readings are normal for the peak systolic velocity.  I discussed that this is likely a normal finding but since we have been assessing it we will repeat it until it maintains normalcy after 2 to 4 weeks.   She also needs to have her titer repeated every 4 to 6 weeks.  The patient had time to ask questions that were answered to her satisfaction.  She verbalized understanding and agrees to proceed with the plan below.  Sonographic findings Single intrauterine pregnancy at 24w 6d.  Fetal cardiac activity:  Observed and appears normal. Presentation: Transverse, head to maternal left. Interval fetal anatomy appears normal. Amniotic fluid volume: Within normal limits. MVP: 4.69 cm. Placenta: Anterior Fundal. MCA dopplers are normal except for occasional absent end diastolic flow but this may be related to the angle of insonation or probe pressure.   There are limitations of prenatal ultrasound such as the inability to detect certain abnormalities due to poor visualization. Various factors such as fetal position, gestational age and maternal body habitus may increase the difficulty in visualizing the fetal anatomy.    Recommendations -Repeat antibody titers every 4 to 6 weeks.  If a critical titer of 1: 16 is met then MCA Dopplers to be done every 1 to 2 weeks.  Since we have been performing them and they have been abnormal with occasional absent end-diastolic flow then we will continue to perform them until they are normal on 2 different occasions. -Growth US  every 3-4 weeks. -Rhogam is still indicated at typical intervals since she is Rh negative without the D antibody   Review of Systems: A review of systems was performed and was negative except per HPI   Vitals and Physical Exam    11/08/2023    1:36 PM 11/01/2023  2:32 PM 10/18/2023    7:08 AM  Vitals with BMI  Systolic 122 119 890  Diastolic 64 75 56  Pulse 80 84 71    Sitting comfortably on the sonogram table Nonlabored breathing Normal rate and rhythm Abdomen is nontender  Past pregnancies OB History  Gravida Para Term Preterm AB Living  3 2 2   0 2  SAB IAB Ectopic Multiple Live Births  0   0 2    # Outcome Date GA Lbr  Len/2nd Weight Sex Type Anes PTL Lv  3 Current           2 Term 09/04/17 [redacted]w[redacted]d / 00:14 3820 g F Vag-Spont EPI  LIV  1 Term 04/30/15 [redacted]w[redacted]d / 00:27 3840 g F Vag-Spont EPI  LIV     I spent 30 minutes reviewing the patients chart, including labs and images as well as counseling the patient about her medical conditions. Greater than 50% of the time was spent in direct face-to-face patient counseling.  Delora Smaller  MFM, [facility]   11/08/2023  2:56 PM

## 2023-11-14 ENCOUNTER — Other Ambulatory Visit: Payer: Self-pay

## 2023-11-14 ENCOUNTER — Observation Stay
Admission: EM | Admit: 2023-11-14 | Discharge: 2023-11-14 | Disposition: A | Discharge status: Home / Self Care | Attending: Certified Nurse Midwife | Admitting: Certified Nurse Midwife

## 2023-11-14 ENCOUNTER — Encounter: Payer: Self-pay | Admitting: Obstetrics and Gynecology

## 2023-11-14 DIAGNOSIS — O210 Mild hyperemesis gravidarum: Principal | ICD-10-CM | POA: Insufficient documentation

## 2023-11-14 DIAGNOSIS — O99342 Other mental disorders complicating pregnancy, second trimester: Secondary | ICD-10-CM | POA: Insufficient documentation

## 2023-11-14 DIAGNOSIS — G43909 Migraine, unspecified, not intractable, without status migrainosus: Secondary | ICD-10-CM | POA: Insufficient documentation

## 2023-11-14 DIAGNOSIS — F32A Depression, unspecified: Secondary | ICD-10-CM | POA: Insufficient documentation

## 2023-11-14 DIAGNOSIS — O99352 Diseases of the nervous system complicating pregnancy, second trimester: Secondary | ICD-10-CM | POA: Insufficient documentation

## 2023-11-14 DIAGNOSIS — M059 Rheumatoid arthritis with rheumatoid factor, unspecified: Secondary | ICD-10-CM | POA: Insufficient documentation

## 2023-11-14 DIAGNOSIS — F419 Anxiety disorder, unspecified: Secondary | ICD-10-CM | POA: Diagnosis not present

## 2023-11-14 DIAGNOSIS — R768 Other specified abnormal immunological findings in serum: Secondary | ICD-10-CM | POA: Insufficient documentation

## 2023-11-14 DIAGNOSIS — R569 Unspecified convulsions: Secondary | ICD-10-CM | POA: Insufficient documentation

## 2023-11-14 DIAGNOSIS — O99891 Other specified diseases and conditions complicating pregnancy: Secondary | ICD-10-CM | POA: Diagnosis not present

## 2023-11-14 DIAGNOSIS — O99712 Diseases of the skin and subcutaneous tissue complicating pregnancy, second trimester: Secondary | ICD-10-CM | POA: Insufficient documentation

## 2023-11-14 DIAGNOSIS — M419 Scoliosis, unspecified: Secondary | ICD-10-CM | POA: Insufficient documentation

## 2023-11-14 DIAGNOSIS — E871 Hypo-osmolality and hyponatremia: Secondary | ICD-10-CM | POA: Diagnosis not present

## 2023-11-14 DIAGNOSIS — Z3A25 25 weeks gestation of pregnancy: Secondary | ICD-10-CM | POA: Insufficient documentation

## 2023-11-14 DIAGNOSIS — R519 Headache, unspecified: Principal | ICD-10-CM | POA: Diagnosis present

## 2023-11-14 LAB — COMPREHENSIVE METABOLIC PANEL WITH GFR
ALT: 19 U/L (ref 0–44)
AST: 26 U/L (ref 15–41)
Albumin: 3.5 g/dL (ref 3.5–5.0)
Alkaline Phosphatase: 86 U/L (ref 38–126)
Anion gap: 8 (ref 5–15)
BUN: 11 mg/dL (ref 6–20)
CO2: 20 mmol/L — ABNORMAL LOW (ref 22–32)
Calcium: 8.8 mg/dL — ABNORMAL LOW (ref 8.9–10.3)
Chloride: 105 mmol/L (ref 98–111)
Creatinine, Ser: 0.72 mg/dL (ref 0.44–1.00)
GFR, Estimated: >60 mL/min (ref >60)
Glucose, Bld: 82 mg/dL (ref 70–99)
Potassium: 3.6 mmol/L (ref 3.5–5.1)
Sodium: 133 mmol/L — ABNORMAL LOW (ref 135–145)
Total Bilirubin: 0.5 mg/dL (ref 0.0–1.2)
Total Protein: 6.9 g/dL (ref 6.5–8.1)

## 2023-11-14 LAB — PROTEIN / CREATININE RATIO, URINE
Creatinine, Urine: 370 mg/dL
Protein Creatinine Ratio: 0.09 mg/mg{creat} (ref 0.00–0.15)
Total Protein, Urine: 33 mg/dL

## 2023-11-14 LAB — CBC
HCT: 35.1 % — ABNORMAL LOW (ref 36.0–46.0)
Hemoglobin: 12.5 g/dL (ref 12.0–15.0)
MCH: 34.4 pg — ABNORMAL HIGH (ref 26.0–34.0)
MCHC: 35.6 g/dL (ref 30.0–36.0)
MCV: 96.7 fL (ref 80.0–100.0)
Platelets: 248 K/uL (ref 150–400)
RBC: 3.63 MIL/uL — ABNORMAL LOW (ref 3.87–5.11)
RDW: 12.1 % (ref 11.5–15.5)
WBC: 9.8 K/uL (ref 4.0–10.5)
nRBC: 0 % (ref 0.0–0.2)

## 2023-11-14 LAB — RESP PANEL BY RT-PCR (RSV, FLU A&B, COVID)  RVPGX2
Influenza A by PCR: NEGATIVE
Influenza B by PCR: NEGATIVE
Resp Syncytial Virus by PCR: NEGATIVE
SARS Coronavirus 2 by RT PCR: NEGATIVE

## 2023-11-14 MED ORDER — DIPHENHYDRAMINE HCL 25 MG PO CAPS
25.0000 mg | ORAL_CAPSULE | Freq: Once | ORAL | Status: AC
Start: 2023-11-14 — End: 2023-11-14
  Administered 2023-11-14: 25 mg via ORAL
  Filled 2023-11-14: qty 1

## 2023-11-14 MED ORDER — MAGNESIUM OXIDE -MG SUPPLEMENT 400 (240 MG) MG PO TABS
400.0000 mg | ORAL_TABLET | Freq: Once | ORAL | Status: AC
  Administered 2023-11-14: 400 mg via ORAL
  Filled 2023-11-14: qty 1

## 2023-11-14 MED ORDER — SODIUM CHLORIDE 0.9 % IV BOLUS
500.0000 mL | Freq: Once | INTRAVENOUS | Status: AC
  Administered 2023-11-14: 500 mL via INTRAVENOUS

## 2023-11-14 MED ORDER — CALCIUM CARBONATE ANTACID 500 MG PO CHEW: 2.0000 | CHEWABLE_TABLET | ORAL | Status: DC | PRN

## 2023-11-14 MED ORDER — ACETAMINOPHEN 325 MG PO TABS: 650.0000 mg | ORAL_TABLET | ORAL | Status: DC | PRN

## 2023-11-14 MED ORDER — ACETAMINOPHEN 500 MG PO TABS
1000.0000 mg | ORAL_TABLET | Freq: Once | ORAL | Status: AC
  Administered 2023-11-14: 1000 mg via ORAL
  Filled 2023-11-14: qty 2

## 2023-11-14 MED ORDER — LACTATED RINGERS IV BOLUS
1000.0000 mL | Freq: Once | INTRAVENOUS | Status: AC
  Administered 2023-11-14: 1000 mL via INTRAVENOUS
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
## 2023-11-14 NOTE — OB Triage Note (Signed)
 Patient is a G3P2002 at [redacted]w[redacted]d who presents to unit c/o HA for the past couple days that has been unrelieved by Tylenol , dizziness, flushed, and just feeling off in general. Reports + fetal movement, denies contractions, leakage of fluid, and vaginal bleeding. Reports some blurry vision, denies epigastric pain and swelling. External monitors applied and assessing. Initial FHT 155. Initial BP 131/95, cycling q .

## 2023-11-15 ENCOUNTER — Other Ambulatory Visit: Payer: Self-pay | Admitting: Maternal & Fetal Medicine

## 2023-11-15 ENCOUNTER — Ambulatory Visit (HOSPITAL_BASED_OUTPATIENT_CLINIC_OR_DEPARTMENT_OTHER)

## 2023-11-15 ENCOUNTER — Other Ambulatory Visit: Payer: Self-pay | Admitting: *Deleted

## 2023-11-15 ENCOUNTER — Ambulatory Visit: Attending: Maternal & Fetal Medicine | Admitting: Obstetrics

## 2023-11-15 VITALS — BP 114/55 | HR 83

## 2023-11-15 DIAGNOSIS — Z148 Genetic carrier of other disease: Secondary | ICD-10-CM

## 2023-11-15 DIAGNOSIS — O09891 Supervision of other high risk pregnancies, first trimester: Secondary | ICD-10-CM | POA: Diagnosis present

## 2023-11-15 DIAGNOSIS — O361920 Maternal care for other isoimmunization, second trimester, not applicable or unspecified: Secondary | ICD-10-CM

## 2023-11-15 DIAGNOSIS — Z3A25 25 weeks gestation of pregnancy: Secondary | ICD-10-CM

## 2023-11-15 DIAGNOSIS — O365920 Maternal care for other known or suspected poor fetal growth, second trimester, not applicable or unspecified: Secondary | ICD-10-CM

## 2023-11-15 DIAGNOSIS — O285 Abnormal chromosomal and genetic finding on antenatal screening of mother: Secondary | ICD-10-CM

## 2023-11-15 DIAGNOSIS — Z362 Encounter for other antenatal screening follow-up: Secondary | ICD-10-CM

## 2023-11-15 DIAGNOSIS — O360120 Maternal care for anti-D [Rh] antibodies, second trimester, not applicable or unspecified: Secondary | ICD-10-CM | POA: Diagnosis not present

## 2023-11-15 DIAGNOSIS — O99342 Other mental disorders complicating pregnancy, second trimester: Secondary | ICD-10-CM | POA: Diagnosis not present

## 2023-11-15 DIAGNOSIS — Z79899 Other long term (current) drug therapy: Secondary | ICD-10-CM | POA: Diagnosis not present

## 2023-11-15 DIAGNOSIS — O365990 Maternal care for other known or suspected poor fetal growth, unspecified trimester, not applicable or unspecified: Secondary | ICD-10-CM

## 2023-11-15 NOTE — Progress Notes (Signed)
 MFM Consult Note  Sarah Haney is currently at 25 weeks and 6 days.  She has been followed due to isoimmunization.  She has screened positive for anti-C and anti-D antibodies.  She reports that her most recent anti-C antibody titer level was 1:4 and the anti-D titer level remains too weak to titer.   She had the Unity cell free DNA test which predicts that the fetus carries the big C antigen and also the D antigen on its red blood cells, indicating that her fetus is at risk for fetal anemia.  This is her third pregnancy.  This is a new FOB.  She reports she received a blood transfusion after the delivery of her first child.   She denies any problems since her last exam and reports feeling fetal movements throughout the day.    Sonographic findings Single intrauterine pregnancy at 25w 6d.  Fetal cardiac activity:  Observed and appears normal. Presentation: Cephalic. Interval fetal anatomy appears normal. Fetal biometry shows the estimated fetal weight of 1 pound 10 ounces which measures at the 9th percentile, indicating IUGR. Amniotic fluid volume: Within normal limits. MVP: 4.35 cm. Placenta: Anterior Fundal.  There are limitations of prenatal ultrasound such as the inability to detect certain abnormalities due to poor visualization. Various factors such as fetal position, gestational age and maternal body habitus may increase the difficulty in visualizing the fetal anatomy.    Isoimmunization with anti-C and anti-D antibodies  The implications and management of isoimmunization was discussed with the patient today.  She was advised that she may have been sensitized to the C and D antigens from her prior delivery or from her prior blood transfusion.    As the fetus is predicted to carry both the C and D antigens on its red blood cells, the fetus may be at risk for fetal anemia.    She was reassured that as the peak systolic velocity of the middle cerebral artery was less than 1.5 multiple  of the median for her gestational age, her fetus is not anemic at this time.   There were no signs of fetal hydrops noted on today's exam.   The patient was reassured by today's findings.   We will continue to follow her with MCA Doppler studies to screen for fetal anemia throughout her pregnancy.  IUGR  The patient was advised that the overall EFW measures at the 9th percentile for her gestational age indicating IUGR.  Doppler studies of the umbilical arteries showed an elevated S/D ratio of 5.27 .  There were no signs of absent or reversed end-diastolic flow.    The patient was advised that IUGR is a common finding.  Most cases of IUGR result in the delivery of a healthy infant at or close to term.    Due to IUGR, she will return in 2 weeks for another umbilical artery Doppler study.  The patient stated that all of her questions were answered today.  A total of 30 minutes was spent counseling and coordinating the care for this patient.  Greater than 50% of the time was spent in direct face-to-face contact.

## 2023-11-16 NOTE — Progress Notes (Signed)
 ROUTINE OB VISIT  Sarah Haney 28 y.o. G3P2002 at [redacted]w[redacted]d  History of Present Illness Sarah Haney is a 28 year old female with hx of hyperemesis gravidarum and POTS who presents with complications during her third pregnancy.  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) - Worsening POTS symptoms during pregnancy - Symptoms significantly impact ability to work - Recent episode of flushing and shakiness prompted emergency room visit - Blood pressure during episode was 141/90, higher than baseline low readings - Current blood pressure is 130/92  Uterine contractions - Experiencing more intense Braxton Hicks contractions compared to previous pregnancies - Contractions described as cramping and tightening of the entire uterus - Symptoms possibly related to dehydration  Fetal growth and maternal-fetal monitoring - Currently [redacted] weeks pregnant - Under regular maternal fetal medicine monitoring for concerns regarding fetal growth and maternal health - Growth scans performed every two weeks - Antibody C and D titers checked every four weeks  Obstetric history - History of postpartum hemorrhage with first delivery requiring transfusion - Second delivery complicated by fetal malposition   O: BP (!) 132/92   Pulse (!) 132   Ht 165.1 cm (5' 5)   Wt 64.9 kg (143 lb)   LMP 05/18/2023 (Exact Date)   BMI 23.80 kg/m   Abdomen: Gravid, nontender; Fundal height: 26 Ext : no edema, no rashes SVE deferred  Fetal heart tones: 145 distinguished from mom  A/P:  IUP at [redacted]w[redacted]d  Assessment & Plan Pregnancy complicated by intrauterine growth restriction, gestational hypertension (suspected),  anti-C and anti-D isoimmunization, postural orthostatic tachycardia syndrome, and resolved hyperemesis gravidarum  The fetus is in the 9th percentile for growth, indicating intrauterine growth restriction. Blood pressure readings of 141/90 and 130/92 suggest suspected gestational hypertension, deviating from her baseline of low blood pressure. Anti-C and anti-D isoimmunization is being monitored with titers every four weeks, and fetal middle cerebral artery Doppler is assessed for anemia. Postural orthostatic tachycardia syndrome is exacerbated during this pregnancy, while hyperemesis gravidarum has resolved, improving her nutritional intake.  - Monitor pregnancy closely, considering early delivery if fetal condition worsens or gestational hypertension is confirmed. - Induction at 37 weeks is recommended if uncontrolled or medicated gestational hypertension is confirmed.  - Order labs at 28 weeks and compare with recent hospital labs. - Monitor blood pressure at home; provide handout on blood pressure tracking. - Advise to seek medical attention if blood pressure reaches 160/110. - Continue monitoring fetal growth every two weeks and middle cerebral artery Doppler for anemia. - Discuss potential for early delivery if intrauterine growth restriction worsens or fetal anemia develops.  Pregnancy with history of postpartum hemorrhage Previous postpartum hemorrhage with her first daughter required a transfusion, relevant for current delivery planning, especially with potential induction or early delivery due to complications. She expressed concerns about her ability to push due to weakness and discussed induction to avoid exhaustion from prolonged labor. - Consider delivery planning with awareness of postpartum hemorrhage history. - Discuss delivery options, including induction and potential for C-section if necessary.  Recording duration: 14 minutes  Preterm labor precautions and warning s/s reviewed   FKC's daily  RTC in 2 weeks  BETHANY JOHNATHAN PENTON, MD

## 2023-11-22 ENCOUNTER — Observation Stay
Admission: EM | Admit: 2023-11-22 | Discharge: 2023-11-22 | Disposition: A | Discharge status: Home / Self Care | Source: Ambulatory Visit | Attending: Obstetrics and Gynecology | Admitting: Obstetrics and Gynecology

## 2023-11-22 ENCOUNTER — Other Ambulatory Visit: Payer: Self-pay

## 2023-11-22 ENCOUNTER — Encounter: Payer: Self-pay | Admitting: Obstetrics and Gynecology

## 2023-11-22 DIAGNOSIS — O132 Gestational [pregnancy-induced] hypertension without significant proteinuria, second trimester: Principal | ICD-10-CM | POA: Insufficient documentation

## 2023-11-22 DIAGNOSIS — Z3A26 26 weeks gestation of pregnancy: Secondary | ICD-10-CM | POA: Insufficient documentation

## 2023-11-22 DIAGNOSIS — O163 Unspecified maternal hypertension, third trimester: Principal | ICD-10-CM | POA: Diagnosis present

## 2023-11-22 DIAGNOSIS — O26892 Other specified pregnancy related conditions, second trimester: Secondary | ICD-10-CM | POA: Diagnosis not present

## 2023-11-22 DIAGNOSIS — Z7982 Long term (current) use of aspirin: Secondary | ICD-10-CM | POA: Diagnosis not present

## 2023-11-22 LAB — COMPREHENSIVE METABOLIC PANEL WITH GFR
ALT: 10 U/L (ref 0–44)
AST: 18 U/L (ref 15–41)
Albumin: 3 g/dL — ABNORMAL LOW (ref 3.5–5.0)
Alkaline Phosphatase: 81 U/L (ref 38–126)
Anion gap: 11 (ref 5–15)
BUN: 10 mg/dL (ref 6–20)
CO2: 20 mmol/L — ABNORMAL LOW (ref 22–32)
Calcium: 8.5 mg/dL — ABNORMAL LOW (ref 8.9–10.3)
Chloride: 104 mmol/L (ref 98–111)
Creatinine, Ser: 0.53 mg/dL (ref 0.44–1.00)
GFR, Estimated: >60 mL/min (ref >60)
Glucose, Bld: 131 mg/dL — ABNORMAL HIGH (ref 70–99)
Potassium: 3.3 mmol/L — ABNORMAL LOW (ref 3.5–5.1)
Sodium: 135 mmol/L (ref 135–145)
Total Bilirubin: 0.5 mg/dL (ref 0.0–1.2)
Total Protein: 6.1 g/dL — ABNORMAL LOW (ref 6.5–8.1)

## 2023-11-22 LAB — PROTEIN / CREATININE RATIO, URINE
Creatinine, Urine: 203 mg/dL
Protein Creatinine Ratio: 0.15 mg/mg{creat} (ref 0.00–0.15)
Total Protein, Urine: 31 mg/dL

## 2023-11-22 LAB — CBC
HCT: 31.1 % — ABNORMAL LOW (ref 36.0–46.0)
Hemoglobin: 11.1 g/dL — ABNORMAL LOW (ref 12.0–15.0)
MCH: 34 pg (ref 26.0–34.0)
MCHC: 35.7 g/dL (ref 30.0–36.0)
MCV: 95.4 fL (ref 80.0–100.0)
Platelets: 199 K/uL (ref 150–400)
RBC: 3.26 MIL/uL — ABNORMAL LOW (ref 3.87–5.11)
RDW: 12.2 % (ref 11.5–15.5)
WBC: 8.5 K/uL (ref 4.0–10.5)
nRBC: 0 % (ref 0.0–0.2)

## 2023-11-22 NOTE — OB Triage Note (Signed)
 Patient is a G3P2002 at [redacted]w[redacted]d who presents to unit with elevated blood pressure at home (140s/90s) with a mild headache. Currently denies blurry vision and epigastric pain but patient did experience it last night. Reports +fetal movement, denies vaginal bleeding and leakage of fluid. External monitors applied and assessing. Initial FHT 135. Initial BP 123/72, cycling q .

## 2023-11-22 NOTE — Discharge Summary (Signed)
 Patient ID: LAKEISA HENINGER MRN: [id number] DOB/AGE: 1995/06/19 28 y.o.  Admit date: 11/22/2023 Discharge date: 11/22/2023  Admission Diagnoses: 28yo G3P2 at [redacted]w[redacted]d presents for elevated home BPs with a headache last night.  Denies changes in vision, or RUQ pain.  Discharge Diagnoses: Normotensive   Factors complicating pregnancy: - hyperemesis gravidarum - migraines - depression and anxiety - rheumatoid factor positive, ANA negative - POTS - nonepileptic seizures - scoliosis - hx of PPH - Rh negative  Prenatal Procedures: NST and Preeclampsia labs  Consults: None  Significant Diagnostic Studies:  Results for orders placed or performed during the hospital encounter of 11/22/23 (from the past week)  CBC   Collection Time: 11/22/23  3:35 PM  Result Value Ref Range   WBC 8.5 4.0 - 10.5 K/uL   RBC 3.26 (L) 3.87 - 5.11 MIL/uL   Hemoglobin 11.1 (L) 12.0 - 15.0 g/dL   HCT 68.8 (L) 63.9 - 53.9 %   MCV 95.4 80.0 - 100.0 fL   MCH 34.0 26.0 - 34.0 pg   MCHC 35.7 30.0 - 36.0 g/dL   RDW 87.7 88.4 - 84.4 %   Platelets 199 150 - 400 K/uL   nRBC 0.0 0.0 - 0.2 %  Comprehensive metabolic panel   Collection Time: 11/22/23  3:35 PM  Result Value Ref Range   Sodium 135 135 - 145 mmol/L   Potassium 3.3 (L) 3.5 - 5.1 mmol/L   Chloride 104 98 - 111 mmol/L   CO2 20 (L) 22 - 32 mmol/L   Glucose, Bld 131 (H) 70 - 99 mg/dL   BUN 10 6 - 20 mg/dL   Creatinine, Ser 9.46 0.44 - 1.00 mg/dL   Calcium  8.5 (L) 8.9 - 10.3 mg/dL   Total Protein 6.1 (L) 6.5 - 8.1 g/dL   Albumin 3.0 (L) 3.5 - 5.0 g/dL   AST 18 15 - 41 U/L   ALT 10 0 - 44 U/L   Alkaline Phosphatase 81 38 - 126 U/L   Total Bilirubin 0.5 0.0 - 1.2 mg/dL   GFR, Estimated >39 >39 mL/min   Anion gap 11 5 - 15  Protein / creatinine ratio, urine   Collection Time: 11/22/23  3:36 PM  Result Value Ref Range   Creatinine, Urine 203 mg/dL   Total Protein, Urine 31 mg/dL   Protein Creatinine Ratio 0.15 0.00 - 0.15 mg/mg[Cre]     Treatments: none  Hospital Course:  This is a 28 y.o. G3P2002 with IUP at [redacted]w[redacted]d seen for elevated home BPs. Vitals:   11/22/23 1625 11/22/23 1640 11/22/23 1654 11/22/23 1711  BP: 115/67 118/74 109/60 106/66   11/22/23 1725 11/22/23 1739 11/22/23 1754 11/22/23 1809  BP: 103/65 93/77 104/66 108/71   11/22/23 1825 11/22/23 1840 11/22/23 1855 11/22/23 1910  BP: 104/67 (!) 107/59 108/64 113/68  She was observed for 4+ hours, fetal heart rate monitoring remained reassuring, and she had no signs/symptoms of Pre-E or other maternal-fetal concerns.  She was deemed stable for discharge to home with outpatient follow up.  Discharge Physical Exam:  BP 113/68   Pulse 87   Temp 99.2 F (37.3 C) (Oral)   Resp 15   Ht 5' 5 (1.651 m)   Wt 64.9 kg   LMP 05/18/2023   BMI 23.80 kg/m   General: NAD CV: RRR Pulm: nl effort ABD: s/nd/nt, gravid DVT Evaluation: LE non-ttp, no evidence of DVT on exam.  NST: FHR baseline: 140 bpm Variability: moderate Accelerations: yes 15x15s Decelerations: none Category/reactivity: reactive  TOCO: quiet SVE: deferred      Discharge Condition: Stable  Disposition:  Discharge disposition: 01-Home or Self Care        Allergies as of 11/22/2023   No Known Allergies      Medication List     STOP taking these medications    famotidine  20 MG tablet Commonly known as: PEPCID    ferrous sulfate  325 (65 FE) MG tablet   promethazine  25 MG tablet Commonly known as: PHENERGAN        TAKE these medications    acetaminophen  500 MG tablet Commonly known as: TYLENOL  Take 2 tablets (1,000 mg total) by mouth every 6 (six) hours as needed.   aspirin EC 81 MG tablet Take 81 mg by mouth daily. Swallow whole.   lamoTRIgine  150 MG tablet Commonly known as: LAMICTAL  Take 150 mg by mouth daily.   ondansetron  8 MG tablet Commonly known as: ZOFRAN  Take 8 mg by mouth.  Take 1 tablet (8 mg total) by mouth every 8 (eight) hours as needed for  Nausea   PNV-DHA 27-0.6-0.4-300 MG Caps Take 1 capsule by mouth daily.   polyethylene glycol 17 g packet Commonly known as: MIRALAX  / GLYCOLAX  Take 17 g by mouth daily as needed for mild constipation or moderate constipation.   QUEtiapine  25 MG tablet Commonly known as: SEROQUEL  Take 25 mg by mouth at bedtime.   senna-docusate 8.6-50 MG tablet Commonly known as: Senokot-S Take 1 tablet by mouth daily.         SignedBETHA DELON COE, CNM 11/22/2023 7:22 PM

## 2023-11-29 ENCOUNTER — Ambulatory Visit (HOSPITAL_BASED_OUTPATIENT_CLINIC_OR_DEPARTMENT_OTHER)

## 2023-11-29 ENCOUNTER — Ambulatory Visit: Attending: Obstetrics | Admitting: Obstetrics

## 2023-11-29 VITALS — BP 115/61

## 2023-11-29 DIAGNOSIS — O99342 Other mental disorders complicating pregnancy, second trimester: Secondary | ICD-10-CM | POA: Diagnosis not present

## 2023-11-29 DIAGNOSIS — Z148 Genetic carrier of other disease: Secondary | ICD-10-CM | POA: Insufficient documentation

## 2023-11-29 DIAGNOSIS — Z3A27 27 weeks gestation of pregnancy: Secondary | ICD-10-CM | POA: Diagnosis not present

## 2023-11-29 DIAGNOSIS — O285 Abnormal chromosomal and genetic finding on antenatal screening of mother: Secondary | ICD-10-CM

## 2023-11-29 DIAGNOSIS — O361920 Maternal care for other isoimmunization, second trimester, not applicable or unspecified: Secondary | ICD-10-CM

## 2023-11-29 DIAGNOSIS — O365920 Maternal care for other known or suspected poor fetal growth, second trimester, not applicable or unspecified: Secondary | ICD-10-CM | POA: Diagnosis not present

## 2023-11-29 DIAGNOSIS — Z79899 Other long term (current) drug therapy: Secondary | ICD-10-CM | POA: Insufficient documentation

## 2023-11-29 DIAGNOSIS — O360120 Maternal care for anti-D [Rh] antibodies, second trimester, not applicable or unspecified: Secondary | ICD-10-CM | POA: Insufficient documentation

## 2023-11-29 DIAGNOSIS — O365990 Maternal care for other known or suspected poor fetal growth, unspecified trimester, not applicable or unspecified: Secondary | ICD-10-CM | POA: Diagnosis present

## 2023-11-29 DIAGNOSIS — O361120 Maternal care for Anti-A sensitization, second trimester, not applicable or unspecified: Secondary | ICD-10-CM

## 2023-11-29 NOTE — Progress Notes (Signed)
 MFM Consult Note  Sarah Haney is currently at 27 weeks and 6 days.  She has been followed due to isoimmunization and IUGR.  She has screened positive for anti-C and anti-D antibodies.  She reports that her most recent anti-C antibody titer level was 1:4 and the anti-D titer level remains too weak to titer.   She had the Unity cell free DNA test which predicts that the fetus carries the big C antigen and also the D antigen on its red blood cells, indicating that her fetus is at risk for fetal anemia.  This is her third pregnancy.  This is a new FOB.  She reports she received a blood transfusion after the delivery of her first child.   She denies any problems since her last exam and reports feeling fetal movements throughout the day.  Her blood pressure today was 115/61.  Sonographic findings Single intrauterine pregnancy at 27w 6d.  Fetal cardiac activity:  Observed and appears normal. Presentation: Cephalic. Amniotic fluid volume: Within normal limits. MVP: 6.46 cm. Placenta: Anterior Fundal.  Doppler studies of the umbilical arteries performed due to IUGR showed an elevated S/D ratio of 4.71.  There were no signs of absent or reversed end-diastolic flow noted today.  The peak systolic velocity of the middle cerebral artery was less than 1.5 multiple of the median for her gestational age, indicating that her fetus is not anemic at this time.   There were no signs of fetal hydrops noted on today's exam. The patient was reassured by today's findings.  Fetal movements were noted throughout today's ultrasound exam.  Isoimmunization with anti-C and anti-D antibodies She was reassured that as the peak systolic velocity of the middle cerebral artery was less than 1.5 multiple of the median for her gestational age, her fetus is not anemic at this time.  The patient was reassured by today's findings.  We will continue to follow her with MCA Doppler studies to screen for fetal anemia throughout  her pregnancy.  IUGR The patient was reassured that as fetal movements and normal amniotic fluid were noted on today's exam, her fetus is doing well.   The umbilical artery Doppler studies remained stable with continued diastolic flow.    Due to IUGR, she will return in 1 week for another umbilical artery, MCA Doppler study, and growth scan.    Due to IUGR and isoimmunization, delivery should be considered at around 37 weeks.  The patient stated that all of her questions were answered today.  A total of 20 minutes was spent counseling and coordinating the care for this patient.  Greater than 50% of the time was spent in direct face-to-face contact.

## 2023-11-30 DIAGNOSIS — O365931 Maternal care for other known or suspected poor fetal growth, third trimester, fetus 1: Secondary | ICD-10-CM | POA: Insufficient documentation

## 2023-11-30 LAB — OB RESULTS CONSOLE HEPATITIS B SURFACE ANTIGEN: Hepatitis B Surface Ag: NEGATIVE

## 2023-11-30 LAB — OB RESULTS CONSOLE RPR: RPR: NONREACTIVE

## 2023-11-30 LAB — OB RESULTS CONSOLE HIV ANTIBODY (ROUTINE TESTING): HIV: NONREACTIVE

## 2023-12-06 ENCOUNTER — Other Ambulatory Visit: Payer: Self-pay | Admitting: Obstetrics

## 2023-12-06 ENCOUNTER — Ambulatory Visit (HOSPITAL_BASED_OUTPATIENT_CLINIC_OR_DEPARTMENT_OTHER): Admitting: Obstetrics and Gynecology

## 2023-12-06 ENCOUNTER — Ambulatory Visit: Attending: Obstetrics

## 2023-12-06 ENCOUNTER — Ambulatory Visit (HOSPITAL_BASED_OUTPATIENT_CLINIC_OR_DEPARTMENT_OTHER): Admitting: *Deleted

## 2023-12-06 VITALS — BP 117/66 | HR 97

## 2023-12-06 DIAGNOSIS — O285 Abnormal chromosomal and genetic finding on antenatal screening of mother: Secondary | ICD-10-CM

## 2023-12-06 DIAGNOSIS — O365990 Maternal care for other known or suspected poor fetal growth, unspecified trimester, not applicable or unspecified: Secondary | ICD-10-CM

## 2023-12-06 DIAGNOSIS — O99213 Obesity complicating pregnancy, third trimester: Secondary | ICD-10-CM | POA: Diagnosis not present

## 2023-12-06 DIAGNOSIS — O4443 Low lying placenta NOS or without hemorrhage, third trimester: Secondary | ICD-10-CM | POA: Diagnosis not present

## 2023-12-06 DIAGNOSIS — Z3A28 28 weeks gestation of pregnancy: Secondary | ICD-10-CM

## 2023-12-06 DIAGNOSIS — Z8279 Family history of other congenital malformations, deformations and chromosomal abnormalities: Secondary | ICD-10-CM

## 2023-12-06 DIAGNOSIS — O361920 Maternal care for other isoimmunization, second trimester, not applicable or unspecified: Secondary | ICD-10-CM

## 2023-12-06 DIAGNOSIS — O365930 Maternal care for other known or suspected poor fetal growth, third trimester, not applicable or unspecified: Secondary | ICD-10-CM | POA: Diagnosis not present

## 2023-12-06 DIAGNOSIS — O403XX0 Polyhydramnios, third trimester, not applicable or unspecified: Secondary | ICD-10-CM

## 2023-12-06 DIAGNOSIS — E669 Obesity, unspecified: Secondary | ICD-10-CM

## 2023-12-06 DIAGNOSIS — Z3A37 37 weeks gestation of pregnancy: Secondary | ICD-10-CM

## 2023-12-06 NOTE — Procedures (Signed)
 Sarah Haney May 07, 1995 [redacted]w[redacted]d  Fetus A Non-Stress Test Interpretation for 12/06/23  Indication: IUGR  Fetal Heart Rate A Mode: External Baseline Rate (A): 140 bpm Variability: Moderate Accelerations: 15 x 15 Decelerations: Variable Multiple birth?: No  Uterine Activity Mode: Palpation, Toco Contraction Frequency (min): none Resting Tone Palpated: Relaxed  Interpretation (Fetal Testing) Nonstress Test Interpretation: Reactive Overall Impression: Reassuring for gestational age Comments: Dr. Arna reviewed tracing

## 2023-12-06 NOTE — Progress Notes (Signed)
 Maternal-Fetal Medicine Consultation  Name: MAICEY BARRIENTEZ  MRN: [id number]  GA: [id number] [redacted]w[redacted]d   -Fetal growth restriction.  On ultrasound performed 3 weeks ago, the estimated fetal weight was at the 9th percentile and the abdominal circumference measurement at the 24th percentile. -Anti-big C antibodies (1:8) and anti-D antibody too weak to titer.  UNITY screening confirmed fetus is positive for big C antigen (and Rh positive).  Ultrasound Normal fetal growth and amniotic fluid.  The estimated fetal weight is at the 27 percentile and the abdominal circumference measurement at the 49th percentile.  Umbilical artery Doppler showed increased S/D ratio.  Middle cerebral artery (MCA) Doppler showed normal peak systolic velocity measurements (no evidence of anemia).  NST is reactive.  I reassured the patient of normal MCA Doppler.  As the antibody levels are too weak to titer, she is unlikely to have fetal hemolysis. Increased S/D ratio on umbilical artery Doppler study in the presence of normal fetal growth is difficult to explain.  She had fetal growth restriction 3 weeks ago.  I reassured the patient that it could be a transient phenomenon.  I reassured the patient of reactive NST.  Recommendations -UA Doppler and MCA Doppler next week - If umbilical artery Doppler is normal, frequency of surveillance may be changed to every 2 to 3 weeks with MCA Dopplers.     Consultation including face-to-face (more than 50%) counseling 20 minutes.

## 2023-12-12 NOTE — Progress Notes (Unsigned)
  Electrophysiology Office Follow up Visit Note:    Date:  12/13/2023   ID:  Sarah Haney, DOB 1995-06-16, MRN [id number]  PCP:  Cyrus Selinda Moose, PA-C  [facility] Cardiologist:  None  [facility] Electrophysiologist:  OLE ONEIDA HOLTS, MD    Interval History:     Sarah Haney is a 28 y.o. female who presents for a follow up visit.   I last saw the patient Jul 12, 2023.  At that appointment we discussed her symptoms and thought that dysautonomia was the most likely diagnosis.  She has been doing better since I last saw her.  Her hyperemesis symptoms have resolved.  She has not needed to take Zofran .  She is staying hydrated.  She does tell me that she feels intermittent swelling in her right lower extremity.  No redness.  Sometimes she feels like her veins are engorged.  No skin breakdown.      Past medical, surgical, social and family history were reviewed.  ROS:   Please see the history of present illness.    All other systems reviewed and are negative.  EKGs/Labs/Other Studies Reviewed:    The following studies were reviewed today:          Physical Exam:    VS:  BP 94/62   Pulse 88   Ht 5' 5 (1.651 m)   Wt 148 lb 3.2 oz (67.2 kg)   LMP 05/18/2023   SpO2 99%   BMI 24.66 kg/m     Wt Readings from Last 3 Encounters:  12/13/23 148 lb 3.2 oz (67.2 kg)  11/22/23 143 lb (64.9 kg)  11/14/23 138 lb (62.6 kg)     GEN: no distress CARD: RRR, No MRG.  No edema or pain in the right lower extremity.  No pain with flexion or extension of the leg/foot.  No erythema. RESP: No IWOB. CTAB.      ASSESSMENT:    1. Dysautonomia (HCC)   2. Acute pain of right lower extremity   3. Swelling of lower extremity during pregnancy in third trimester    PLAN:    In order of problems listed above:  #Dysautonomia #POTS #Orthostatic hypotension Much improved since I last saw her. I have recommended that she liberalize salt intake to 8 to 12 g of  sodium, increase water  intake to at least 3 L daily.  He should also incorporate aerobic exercise into her activities as this has been shown to help patients with POTS.  The session should occur at least 4 times per week for 30 to 40 minutes per session.  Recumbent activities would be best.  I have also recommended compression garments as these have been shown to help some patients with dysautonomia/POTS.  She can also consider elevating the head of the bed by 6 to 8 inches.  #Lower extremity swelling I have a low suspicion for DVT but given she is [redacted] weeks pregnant, will order a lower extremity ultrasound.  I have encouraged her to discuss this with her OB.  She has an appointment later today with them.  I discussed my upcoming departure from [facility] during today's clinic appointment.  She can transition her care to one of my partners, Dr. Kennyth.   Follow-up with primary care physician moving forward.   Signed, OLE HOLTS, MD, [facility] 12/13/2023 10:37 AM    Electrophysiology [facility]

## 2023-12-13 ENCOUNTER — Ambulatory Visit: Attending: Cardiology | Admitting: Cardiology

## 2023-12-13 ENCOUNTER — Ambulatory Visit: Attending: Maternal & Fetal Medicine

## 2023-12-13 ENCOUNTER — Encounter: Payer: Self-pay | Admitting: Cardiology

## 2023-12-13 ENCOUNTER — Ambulatory Visit (HOSPITAL_BASED_OUTPATIENT_CLINIC_OR_DEPARTMENT_OTHER)

## 2023-12-13 ENCOUNTER — Other Ambulatory Visit: Payer: Self-pay

## 2023-12-13 VITALS — BP 94/62 | HR 88 | Ht 65.0 in | Wt 148.2 lb

## 2023-12-13 DIAGNOSIS — O99343 Other mental disorders complicating pregnancy, third trimester: Secondary | ICD-10-CM | POA: Diagnosis not present

## 2023-12-13 DIAGNOSIS — Z3A29 29 weeks gestation of pregnancy: Secondary | ICD-10-CM

## 2023-12-13 DIAGNOSIS — Z3A30 30 weeks gestation of pregnancy: Secondary | ICD-10-CM

## 2023-12-13 DIAGNOSIS — O1203 Gestational edema, third trimester: Secondary | ICD-10-CM | POA: Insufficient documentation

## 2023-12-13 DIAGNOSIS — N83209 Unspecified ovarian cyst, unspecified side: Secondary | ICD-10-CM | POA: Diagnosis not present

## 2023-12-13 DIAGNOSIS — O285 Abnormal chromosomal and genetic finding on antenatal screening of mother: Secondary | ICD-10-CM | POA: Diagnosis not present

## 2023-12-13 DIAGNOSIS — O365990 Maternal care for other known or suspected poor fetal growth, unspecified trimester, not applicable or unspecified: Secondary | ICD-10-CM

## 2023-12-13 DIAGNOSIS — M79604 Pain in right leg: Secondary | ICD-10-CM | POA: Diagnosis present

## 2023-12-13 DIAGNOSIS — G901 Familial dysautonomia [Riley-Day]: Secondary | ICD-10-CM | POA: Diagnosis not present

## 2023-12-13 DIAGNOSIS — Z148 Genetic carrier of other disease: Secondary | ICD-10-CM | POA: Diagnosis present

## 2023-12-13 DIAGNOSIS — O3483 Maternal care for other abnormalities of pelvic organs, third trimester: Secondary | ICD-10-CM | POA: Diagnosis not present

## 2023-12-13 DIAGNOSIS — O360130 Maternal care for anti-D [Rh] antibodies, third trimester, not applicable or unspecified: Secondary | ICD-10-CM | POA: Insufficient documentation

## 2023-12-13 DIAGNOSIS — O99342 Other mental disorders complicating pregnancy, second trimester: Secondary | ICD-10-CM | POA: Diagnosis not present

## 2023-12-13 DIAGNOSIS — O361930 Maternal care for other isoimmunization, third trimester, not applicable or unspecified: Secondary | ICD-10-CM | POA: Insufficient documentation

## 2023-12-13 NOTE — Patient Instructions (Addendum)
 Medication Instructions:  Your physician recommends that you continue on your current medications as directed. Please refer to the Current Medication list given to you today.  *If you need a refill on your cardiac medications before your next appointment, please call your pharmacy*  Tests/Procedures Lower Extremity Ultrasound Your physician has requested that you have a lower or upper extremity venous duplex. This test is an ultrasound of the veins in the legs or arms. It looks at venous blood flow that carries blood from the heart to the legs or arms. Allow one hour for a Lower Venous exam. Allow thirty minutes for an Upper Venous exam. There are no restrictions or special instructions.  Please note: We ask at that you not bring children with you during ultrasound (echo/ vascular) testing. Due to room size and safety concerns, children are not allowed in the ultrasound rooms during exams. Our front office staff cannot provide observation of children in our lobby area while testing is being conducted. An adult accompanying a patient to their appointment will only be allowed in the ultrasound room at the discretion of the ultrasound technician under special circumstances. We apologize for any inconvenience.    Follow-Up: At [facility], you and your health needs are our priority.  As part of our continuing mission to provide you with exceptional heart care, our providers are all part of one team.  This team includes your primary Cardiologist (physician) and Advanced Practice Providers or APPs (Physician Assistants and Nurse Practitioners) who all work together to provide you with the care you need, when you need it.  Your next appointment:   As needed with Dr. Kennyth

## 2023-12-14 ENCOUNTER — Other Ambulatory Visit: Payer: Self-pay | Admitting: Obstetrics and Gynecology

## 2023-12-14 DIAGNOSIS — M7989 Other specified soft tissue disorders: Secondary | ICD-10-CM

## 2023-12-15 ENCOUNTER — Ambulatory Visit
Admission: RE | Admit: 2023-12-15 | Discharge: 2023-12-15 | Disposition: A | Discharge status: Home / Self Care | Source: Ambulatory Visit | Attending: Obstetrics and Gynecology | Admitting: Obstetrics and Gynecology

## 2023-12-15 DIAGNOSIS — M7989 Other specified soft tissue disorders: Secondary | ICD-10-CM | POA: Diagnosis present

## 2023-12-18 ENCOUNTER — Ambulatory Visit (HOSPITAL_COMMUNITY)

## 2023-12-28 ENCOUNTER — Other Ambulatory Visit: Payer: Self-pay | Admitting: Maternal & Fetal Medicine

## 2023-12-28 ENCOUNTER — Ambulatory Visit

## 2023-12-28 ENCOUNTER — Ambulatory Visit: Attending: Obstetrics and Gynecology | Admitting: Obstetrics

## 2023-12-28 VITALS — BP 113/64 | HR 80

## 2023-12-28 DIAGNOSIS — O361930 Maternal care for other isoimmunization, third trimester, not applicable or unspecified: Secondary | ICD-10-CM | POA: Diagnosis not present

## 2023-12-28 DIAGNOSIS — Z3A32 32 weeks gestation of pregnancy: Secondary | ICD-10-CM | POA: Diagnosis not present

## 2023-12-28 DIAGNOSIS — O285 Abnormal chromosomal and genetic finding on antenatal screening of mother: Secondary | ICD-10-CM

## 2023-12-28 DIAGNOSIS — O365990 Maternal care for other known or suspected poor fetal growth, unspecified trimester, not applicable or unspecified: Secondary | ICD-10-CM

## 2023-12-28 DIAGNOSIS — O361130 Maternal care for Anti-A sensitization, third trimester, not applicable or unspecified: Secondary | ICD-10-CM | POA: Diagnosis not present

## 2023-12-28 DIAGNOSIS — Z148 Genetic carrier of other disease: Secondary | ICD-10-CM | POA: Diagnosis not present

## 2023-12-28 DIAGNOSIS — O365930 Maternal care for other known or suspected poor fetal growth, third trimester, not applicable or unspecified: Secondary | ICD-10-CM

## 2023-12-28 NOTE — Progress Notes (Signed)
 MFM Consult Note  Sarah Haney is currently at 32 weeks and 0 days.  She has been followed due to isoimmunization and IUGR.  She has screened positive for anti-C and anti-D antibodies.    She had the Unity cell free DNA test which predicts that the fetus carries the big C antigen and also the D antigen on its red blood cells, indicating that her fetus is at risk for fetal anemia.  She denies any problems since her last exam and reports feeling fetal movements throughout the day.  Her blood pressure today was 113/64.  The patient reports that she fell this morning and landed on her right side.  Sonographic findings Single intrauterine pregnancy at 32w 0d. Fetal cardiac activity: Observed. Presentation: Cephalic. Amniotic fluid: Within normal limits.  AFI: 13.08 cm.  MVP: 5.08 cm. Placenta: Anterior. BPP: 10/10 with a reactive NST. No contractions were noted on her NST.  Doppler studies of the umbilical arteries performed due to IUGR showed a normal S/D ratio of 2.86.  There were no signs of absent or reversed end-diastolic flow noted today.  The peak systolic velocity of the middle cerebral artery was less than 1.5 multiple of the median for her gestational age, indicating that her fetus is not anemic at this time.   There were no signs of fetal hydrops noted on today's exam. The patient was reassured by today's findings.  Fetal movements were noted throughout today's ultrasound exam.  Isoimmunization with anti-C and anti-D antibodies She was reassured that as the peak systolic velocity of the middle cerebral artery was less than 1.5 multiple of the median for her gestational age, her fetus is not anemic at this time.  We will continue to follow her with MCA Doppler studies to screen for fetal anemia throughout her pregnancy.  IUGR The patient was reassured that as fetal movements and normal amniotic fluid were noted on today's exam, her fetus is doing well.   Due to IUGR, she will  return in 1 week for another umbilical artery, MCA Doppler study, and growth scan.   Due to IUGR and isoimmunization, delivery should be considered at around 37 weeks.  Due to her fall today, the patient was advised to go to the hospital should she complain of decreased fetal movements, abdominal pain, or vaginal bleeding.  The patient stated that all of her questions were answered today.  A total of 20 minutes was spent counseling and coordinating the care for this patient.  Greater than 50% of the time was spent in direct face-to-face contact.

## 2023-12-28 NOTE — Procedures (Signed)
 ABIE KILLIAN May 30, 1995 [redacted]w[redacted]d  Fetus A Non-Stress Test Interpretation for 12/28/23  Indication: IUGR and maternal fall  Fetal Heart Rate A Mode: External Baseline Rate (A): 135 bpm Variability: Moderate Accelerations: 15 x 15 Decelerations: None Multiple birth?: No  Uterine Activity Mode: Palpation, Toco Contraction Frequency (min): none noted Resting Tone Palpated: Relaxed  Interpretation (Fetal Testing) Nonstress Test Interpretation: Reactive Comments: Reviewed with Dr. Ileana

## 2024-01-03 ENCOUNTER — Ambulatory Visit

## 2024-01-03 ENCOUNTER — Other Ambulatory Visit: Payer: Self-pay

## 2024-01-03 ENCOUNTER — Ambulatory Visit: Attending: Maternal & Fetal Medicine

## 2024-01-03 DIAGNOSIS — O361930 Maternal care for other isoimmunization, third trimester, not applicable or unspecified: Secondary | ICD-10-CM | POA: Diagnosis not present

## 2024-01-03 DIAGNOSIS — Z3A32 32 weeks gestation of pregnancy: Secondary | ICD-10-CM | POA: Diagnosis not present

## 2024-01-03 DIAGNOSIS — O360190 Maternal care for anti-D [Rh] antibodies, unspecified trimester, not applicable or unspecified: Secondary | ICD-10-CM | POA: Diagnosis present

## 2024-01-03 DIAGNOSIS — O285 Abnormal chromosomal and genetic finding on antenatal screening of mother: Secondary | ICD-10-CM

## 2024-01-03 DIAGNOSIS — O360130 Maternal care for anti-D [Rh] antibodies, third trimester, not applicable or unspecified: Secondary | ICD-10-CM | POA: Diagnosis not present

## 2024-01-03 DIAGNOSIS — O365990 Maternal care for other known or suspected poor fetal growth, unspecified trimester, not applicable or unspecified: Secondary | ICD-10-CM

## 2024-01-03 DIAGNOSIS — O365931 Maternal care for other known or suspected poor fetal growth, third trimester, fetus 1: Secondary | ICD-10-CM

## 2024-01-08 ENCOUNTER — Ambulatory Visit

## 2024-01-15 ENCOUNTER — Other Ambulatory Visit

## 2024-01-22 ENCOUNTER — Other Ambulatory Visit: Payer: Self-pay

## 2024-01-22 ENCOUNTER — Encounter: Payer: Self-pay | Admitting: Obstetrics and Gynecology

## 2024-01-22 ENCOUNTER — Observation Stay
Admission: EM | Admit: 2024-01-22 | Discharge: 2024-01-22 | Disposition: A | Discharge status: Home / Self Care | Attending: Obstetrics | Admitting: Obstetrics and Gynecology

## 2024-01-22 DIAGNOSIS — Z7982 Long term (current) use of aspirin: Secondary | ICD-10-CM | POA: Diagnosis not present

## 2024-01-22 DIAGNOSIS — M25552 Pain in left hip: Secondary | ICD-10-CM | POA: Diagnosis not present

## 2024-01-22 DIAGNOSIS — O4703 False labor before 37 completed weeks of gestation, third trimester: Secondary | ICD-10-CM | POA: Diagnosis present

## 2024-01-22 DIAGNOSIS — O99891 Other specified diseases and conditions complicating pregnancy: Secondary | ICD-10-CM | POA: Insufficient documentation

## 2024-01-22 DIAGNOSIS — Z3A35 35 weeks gestation of pregnancy: Secondary | ICD-10-CM | POA: Diagnosis not present

## 2024-01-22 LAB — URINALYSIS, ROUTINE W REFLEX MICROSCOPIC
Bilirubin Urine: NEGATIVE
Glucose, UA: NEGATIVE mg/dL
Hgb urine dipstick: NEGATIVE
Ketones, ur: NEGATIVE mg/dL
Nitrite: NEGATIVE
Protein, ur: NEGATIVE mg/dL
Specific Gravity, Urine: 1.005 (ref 1.005–1.030)
pH: 7 (ref 5.0–8.0)

## 2024-01-22 LAB — BPAM RBC
Blood Product Expiration Date: 202512232359
Unit Type and Rh: 9500

## 2024-01-22 LAB — CHLAMYDIA/NGC RT PCR (ARMC ONLY)
Chlamydia Tr: NOT DETECTED
N gonorrhoeae: NOT DETECTED

## 2024-01-22 LAB — WET PREP, GENITAL
Clue Cells Wet Prep HPF POC: NONE SEEN
Sperm: NONE SEEN
Trich, Wet Prep: NONE SEEN
WBC, Wet Prep HPF POC: <10 (ref <10)
Yeast Wet Prep HPF POC: NONE SEEN

## 2024-01-22 MED ORDER — TERBUTALINE SULFATE 1 MG/ML IJ SOLN: 0.2500 mg | Freq: Once | INTRAMUSCULAR | Status: DC | PRN

## 2024-01-22 MED ORDER — ACETAMINOPHEN 500 MG PO TABS
1000.0000 mg | ORAL_TABLET | Freq: Four times a day (QID) | ORAL | Status: DC | PRN
  Administered 2024-01-22: 1000 mg via ORAL
  Filled 2024-01-22: qty 2

## 2024-01-22 NOTE — OB Triage Note (Signed)
 Patient presents to triage with complaints of painful contractions approximately every 8-10 minutes. Patient states contractions started 2 days ago.Contractions had stopped after the first day but returned this morning around 9am. Patient was walking in [place] and started to feel painful contractions accompanied by left hip pain. Patient reports pain 4-5 out of 10 with contractions. Patient denies LOF. VS WDL. Monitors applied.

## 2024-01-22 NOTE — OB Triage Note (Signed)
 Monitoring complete. Rare contractions noted on monitor. Labs resulted. Patient discharged to home. Patient verbalizes understanding of discharge teaching.

## 2024-01-22 NOTE — Discharge Summary (Signed)
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 median for her gestational age,  indicating that her fetus is not anemic at this time.  There were no signs of fetal hydrops noted on today's exam.  The patient was reassured by today's findings.  Fetal movements were noted throughout today's ultrasound  exam.  Isoimmunization with anti-C and anti-D antibodies  She was reassured that as the peak systolic velocity of the  middle cerebral artery was less than 1.5 multiple of the  median for her gestational age, her fetus is not anemic at this   time.  We will continue to follow her with MCA Doppler studies to  screen for fetal anemia throughout her pregnancy.  IUGR  The patient was reassured that as fetal movements and  normal amniotic fluid were noted on today's exam, her fetus  is doing well.  Due to IUGR, she will return in 1 week for another umbilical  artery, MCA Doppler study, and growth scan.  Due to IUGR and isoimmunization, delivery should be  considered at around 37 weeks.  Due to her fall today, the patient was advised to go to the  hospital should she complain of decreased fetal movements,  abdominal pain, or vaginal bleeding.  The patient stated that all  of her questions were answered  today.  A total of 20 minutes was spent counseling and coordinating  the care for this patient.  Greater than 50% of the time was  spent in direct face-to-face contact. ----------------------------------------------------------------------                   Steffan Keys, MD Electronically Signed Final Report   12/28/2023 05:33 pm ----------------------------------------------------------------------   US  MFM FETAL BPP W/NONSTRESS Result Date: 12/28/2023 ----------------------------------------------------------------------  OBSTETRICS REPORT                       (Signed Final 12/28/2023 05:33 pm) ---------------------------------------------------------------------- Patient Info  ID #:       [id number]                          D.O.B.:  31-Oct-1995 (28 yrs)(F)  Name:       MITZIE MATSU Hypolite                Visit Date: 12/28/2023 10:03 am ---------------------------------------------------------------------- Performed By  Attending:        Steffan Keys MD         Ref. Address:     [address]                                                             [place]                                                             [address]  Performed By:     Comer Harrow       [facility]  Referred By:      [facility] ---------------------------------------------------------------------- Orders  #  Description                           Code        Ordered By  1  US  MFM UA CORD DOPPLER                76820.02    YU FANG  2  US  MFM MCA DOPPLER                    76821.01    YU FANG  3  US  MFM FETAL BPP                      23181.4     NATHANEL LEESA FETTERS ----------------------------------------------------------------------  #  Order #                     Accession #                Episode #  1  [id number]                   [id number]                 [id number]  2  [id number]                   [id number]                 [id number]  3  [id number]                   [id number]                 [id number] ---------------------------------------------------------------------- Indications  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  Isoimmunization - Other                        O36.1910  Abnormal chromosomal and genetic finding       O28.5  on antenatal screening of mother  Rh negative state in antepartum                O36.0190  [redacted] weeks gestation of pregnancy                Z3A.32  LR female, neg Inheritest ---------------------------------------------------------------------- Vital Signs  BP:          113/64 ---------------------------------------------------------------------- Fetal Evaluation  Num Of Fetuses:         1  Fetal Heart Rate(bpm):  138  Cardiac Activity:       Observed  Presentation:           Cephalic  Placenta:               Anterior  P. Cord Insertion:      Previously seen  Amniotic Fluid  AFI FV:      Within normal limits  AFI Sum(cm)     %Tile       Largest Pocket(cm)  13.08  40          5.08  RUQ(cm)       RLQ(cm)       LUQ(cm)        LLQ(cm)  2.72          5.08          1.7            3.58 ---------------------------------------------------------------------- Biophysical Evaluation  Amniotic F.V:   Pocket => 2 cm             F. Tone:        Observed  F. Movement:    Observed                   N.S.T:          Reactive  F. Breathing:   Observed                   Score:          10/10 ---------------------------------------------------------------------- OB History  Gravidity:    3         Term:   2  Living:       2 ---------------------------------------------------------------------- Gestational Age  LMP:           32w 0d        Date:  05/18/23                  EDD:   02/22/24  Best:          bobbye 0d     Det. By:  LMP  (05/18/23)          EDD:   02/22/24 ---------------------------------------------------------------------- Doppler - Fetal Vessels  Umbilical Artery   S/D     %tile      RI    %tile      PI    %tile     PSV    ADFV    RDFV                                                     (cm/s)   2.86       61    0.65       67    1.03       75    55.96      No      No  Middle Cerebral Artery                                                       PSV   MoM                                                     (cm/s)  60.44  1.38 ---------------------------------------------------------------------- Comments  Mitzie Lager is currently at 32 weeks and 0 days.  She  has been followed due to isoimmunization and IUGR.  She  has screened positive for anti-C and anti-D antibodies.  She had the [facility] cell free DNA test which predicts that the  fetus carries the big C antigen and also the D antigen on its  red blood cells, indicating that her fetus is at risk for fetal  anemia.  She denies any problems since her last exam and reports  feeling fetal movements throughout the day.  Her blood  pressure today was 113/64.  The patient reports that she  fell this morning and landed on  her right side.  Sonographic findings  Single intrauterine pregnancy at 32w 0d.  Fetal cardiac activity: Observed.  Presentation: Cephalic.  Amniotic fluid: Within normal limits.  AFI: 13.08 cm.  MVP:  5.08 cm.  Placenta: Anterior.  BPP: 10/10 with a reactive NST.  No contractions were noted  on her NST.  Doppler studies of the umbilical arteries performed due to  IUGR showed a normal S/D ratio of 2.86.  There were no  signs of absent or reversed end-diastolic flow noted today.  The peak systolic velocity of the middle cerebral artery was  less than 1.5 multiple of the median for her gestational age,  indicating that her fetus is not anemic at this time.  There were no signs of fetal hydrops noted on today's exam.  The patient was reassured by today's findings.  Fetal movements were noted throughout today's ultrasound  exam.  Isoimmunization with anti-C and anti-D antibodies  She was reassured that as the peak systolic velocity of the  middle cerebral artery was less than 1.5 multiple of the  median for her gestational age, her fetus is not anemic at this  time.  We will continue to follow her with MCA Doppler studies to  screen for fetal anemia throughout her pregnancy.  IUGR  The patient was reassured that as fetal movements and  normal amniotic fluid were noted on today's exam, her fetus  is doing well.  Due to IUGR, she will return in 1 week for another umbilical  artery, MCA Doppler study, and growth scan.  Due to IUGR and isoimmunization, delivery should be  considered at around 37 weeks.  Due to her fall today, the patient was advised to go to the  hospital should she complain of decreased fetal movements,  abdominal pain, or vaginal bleeding.  The patient stated that all of her questions were answered  today.  A total of 20 minutes was spent counseling and coordinating  the care for this patient.  Greater than 50% of the time was  spent in direct face-to-face contact.  ----------------------------------------------------------------------                   Steffan Keys, MD Electronically Signed Final Report   12/28/2023 05:33 pm ----------------------------------------------------------------------    Constitutional: NAD, AAOx3  PULM: nl respiratory effort Abd: gravid, non-tender Ext: Non-tender, Nonedmeatous Psych: mood appropriate, speech normal Pelvic : deferred SVE: Dilation: Fingertip Effacement (%): Thick Cervical Position: Posterior Station: Ballotable Presentation: Vertex Exam by:: Excell Alvine, RN   NST: Baseline FHR: 125 beats/min Variability: moderate Accelerations: present Decelerations: absent Tocometry: rare x 2 Time: at least 20 minutes   Interpretation: Category I INDICATIONS: rule out uterine contractions RESULTS:  A NST procedure was performed with FHR monitoring and a normal baseline established, appropriate time of 1  hr 40 minutes of evaluation, and accels >2 seen w 15x15 characteristics.  Results show a REACTIVE NST.   Consults: None  Procedures: fetal monitoring, oral tylenol   Hospital Course: The patient was admitted to Labor and Delivery Triage for observation for  preterm uterine contractions and hip pain. Her FHT was reassuring, category 1, and showed rare contractions. She was deemed stable for discharge and further outpatient management.   Discharge Condition: stable  Disposition: Discharge disposition: 01-Home or Self Care       Allergies as of 01/22/2024   No Known Allergies      Medication List     TAKE these medications    acetaminophen  500 MG tablet Commonly known as: TYLENOL  Take 2 tablets (1,000 mg total) by mouth every 6 (six) hours as needed.   aspirin EC 81 MG tablet Take 81 mg by mouth daily. Swallow whole.   lamoTRIgine  150 MG tablet Commonly known as: LAMICTAL  Take 150 mg by mouth daily.   ondansetron  8 MG tablet Commonly known as: ZOFRAN  Take 8 mg by mouth.  Take 1 tablet  (8 mg total) by mouth every 8 (eight) hours as needed for Nausea   PNV-DHA 27-0.6-0.4-300 MG Caps Take 1 capsule by mouth daily.   polyethylene glycol 17 g packet Commonly known as: MIRALAX  / GLYCOLAX  Take 17 g by mouth daily as needed for mild constipation or moderate constipation.   QUEtiapine  25 MG tablet Commonly known as: SEROQUEL  Take 25 mg by mouth at bedtime.   senna-docusate 8.6-50 MG tablet Commonly known as: Senokot-S Take 1 tablet by mouth daily.       Dr Verdon updated and aware ----- Zymier Rodgers, CNM  Certified Nurse Midwife [facility]

## 2024-01-23 LAB — BPAM RBC
Blood Product Expiration Date: 202512302359
Unit Type and Rh: 9500

## 2024-01-23 LAB — TYPE AND SCREEN
ABO/RH(D): O NEG
Antibody Screen: POSITIVE
Donor AG Type: NEGATIVE
Donor AG Type: NEGATIVE
Unit division: 0
Unit division: 0

## 2024-01-25 ENCOUNTER — Other Ambulatory Visit: Payer: Self-pay

## 2024-01-25 ENCOUNTER — Observation Stay
Admission: EM | Admit: 2024-01-25 | Discharge: 2024-01-25 | Disposition: A | Discharge status: Home / Self Care | Attending: Obstetrics and Gynecology | Admitting: Obstetrics and Gynecology

## 2024-01-25 DIAGNOSIS — Z7982 Long term (current) use of aspirin: Secondary | ICD-10-CM | POA: Diagnosis not present

## 2024-01-25 DIAGNOSIS — O368130 Decreased fetal movements, third trimester, not applicable or unspecified: Secondary | ICD-10-CM | POA: Diagnosis present

## 2024-01-25 DIAGNOSIS — Z3A36 36 weeks gestation of pregnancy: Secondary | ICD-10-CM | POA: Insufficient documentation

## 2024-01-25 DIAGNOSIS — O368190 Decreased fetal movements, unspecified trimester, not applicable or unspecified: Principal | ICD-10-CM | POA: Diagnosis present

## 2024-01-25 NOTE — OB Triage Note (Signed)
 Fetal monitoring for 1 hour performed. Baseline 135, Moderate variability, 15x15 accels, no decels. No contractions noted. Patient reports positive fetal movement. Patient discharged per order.

## 2024-01-25 NOTE — OB Triage Note (Signed)
 Patient reports decreased fetal movement as of 11pm last night. Patient denies LOF and any bleeding. Fetal monitors placed. Initial Heart Tones 143.  BP 123/83 HR 85 Temp 98.5 Plan to monitor for at least one hour. Provider notified.
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
## 2024-01-26 LAB — OB RESULTS CONSOLE GC/CHLAMYDIA
Chlamydia: NEGATIVE
Neisseria Gonorrhea: NEGATIVE

## 2024-01-26 LAB — OB RESULTS CONSOLE GBS: GBS: NEGATIVE

## 2024-01-29 ENCOUNTER — Observation Stay
Admission: EM | Admit: 2024-01-29 | Discharge: 2024-01-29 | Disposition: A | Discharge status: Home / Self Care | Attending: Obstetrics and Gynecology | Admitting: Obstetrics and Gynecology

## 2024-01-29 ENCOUNTER — Encounter: Payer: Self-pay | Admitting: Obstetrics and Gynecology

## 2024-01-29 DIAGNOSIS — Z3A36 36 weeks gestation of pregnancy: Secondary | ICD-10-CM | POA: Diagnosis not present

## 2024-01-29 DIAGNOSIS — O163 Unspecified maternal hypertension, third trimester: Principal | ICD-10-CM | POA: Diagnosis present

## 2024-01-29 DIAGNOSIS — O133 Gestational [pregnancy-induced] hypertension without significant proteinuria, third trimester: Secondary | ICD-10-CM | POA: Diagnosis present

## 2024-01-29 DIAGNOSIS — Z7982 Long term (current) use of aspirin: Secondary | ICD-10-CM | POA: Diagnosis not present

## 2024-01-29 LAB — CBC
HCT: 29.4 % — ABNORMAL LOW (ref 36.0–46.0)
Hemoglobin: 10 g/dL — ABNORMAL LOW (ref 12.0–15.0)
MCH: 31.7 pg (ref 26.0–34.0)
MCHC: 34 g/dL (ref 30.0–36.0)
MCV: 93.3 fL (ref 80.0–100.0)
Platelets: 186 K/uL (ref 150–400)
RBC: 3.15 MIL/uL — ABNORMAL LOW (ref 3.87–5.11)
RDW: 12.3 % (ref 11.5–15.5)
WBC: 10.3 K/uL (ref 4.0–10.5)
nRBC: 0 % (ref 0.0–0.2)

## 2024-01-29 LAB — COMPREHENSIVE METABOLIC PANEL WITH GFR
ALT: 7 U/L (ref 0–44)
AST: 15 U/L (ref 15–41)
Albumin: 3.5 g/dL (ref 3.5–5.0)
Alkaline Phosphatase: 175 U/L — ABNORMAL HIGH (ref 38–126)
Anion gap: 10 (ref 5–15)
BUN: 10 mg/dL (ref 6–20)
CO2: 21 mmol/L — ABNORMAL LOW (ref 22–32)
Calcium: 9.2 mg/dL (ref 8.9–10.3)
Chloride: 103 mmol/L (ref 98–111)
Creatinine, Ser: 0.6 mg/dL (ref 0.44–1.00)
GFR, Estimated: >60 mL/min (ref >60)
Glucose, Bld: 81 mg/dL (ref 70–99)
Potassium: 4.1 mmol/L (ref 3.5–5.1)
Sodium: 134 mmol/L — ABNORMAL LOW (ref 135–145)
Total Bilirubin: 0.3 mg/dL (ref 0.0–1.2)
Total Protein: 6.1 g/dL — ABNORMAL LOW (ref 6.5–8.1)

## 2024-01-29 LAB — PROTEIN / CREATININE RATIO, URINE
Creatinine, Urine: 63 mg/dL
Protein Creatinine Ratio: 0.2 mg/mg{creat} — ABNORMAL HIGH (ref 0.00–0.15)
Total Protein, Urine: 12 mg/dL

## 2024-01-29 MED ORDER — ACETAMINOPHEN 325 MG PO TABS
650.0000 mg | ORAL_TABLET | ORAL | Status: DC | PRN
  Administered 2024-01-29: 650 mg via ORAL
  Filled 2024-01-29: qty 2

## 2024-01-29 MED ORDER — LABETALOL HCL 5 MG/ML IV SOLN: 40.0000 mg | INTRAVENOUS | Status: DC | PRN

## 2024-01-29 MED ORDER — LABETALOL HCL 5 MG/ML IV SOLN: 80.0000 mg | INTRAVENOUS | Status: DC | PRN

## 2024-01-29 MED ORDER — LABETALOL HCL 5 MG/ML IV SOLN: 20.0000 mg | INTRAVENOUS | Status: DC | PRN

## 2024-01-29 MED ORDER — HYDRALAZINE HCL 20 MG/ML IJ SOLN: 10.0000 mg | INTRAMUSCULAR | Status: DC | PRN

## 2024-01-29 NOTE — Discharge Summary (Signed)
 Patient ID: Sarah Haney MRN: [id number] DOB/AGE: [birth year] 28 y.o.  Admit date: 01/29/2024 Discharge date: 01/29/2024  Admission Diagnoses: 28yo G3P2 at [redacted]w[redacted]d presents for elevated blood pressure and a headache last night, this morning blood pressures were 130s/80 and she has another headache of 3/10.  She was diagnosed with GHTN at 26 weeks.    Discharge Diagnoses: GHTN  Factors complicating pregnancy: Gestational hypertension dx at 26wks by pressures H/o mental health diagnoses: Depression/Anxiety Migraines  Rheumatoid factor positive POTS Non-epileptic seizures Scoliosis History of PPH Rh negative  Prenatal Procedures: NST and Preeclampsia labs  Consults: None  Significant Diagnostic Studies:  Results for orders placed or performed during the hospital encounter of 01/29/24 (from the past 24 hours)  Protein / creatinine ratio, urine     Status: Abnormal   Collection Time: 01/29/24  1:32 PM  Result Value Ref Range   Creatinine, Urine 63 mg/dL   Total Protein, Urine 12 mg/dL   Protein Creatinine Ratio 0.20 (H) 0.00 - 0.15 mg/mg[Cre]  Comprehensive metabolic panel     Status: Abnormal   Collection Time: 01/29/24  1:49 PM  Result Value Ref Range   Sodium 134 (L) 135 - 145 mmol/L   Potassium 4.1 3.5 - 5.1 mmol/L   Chloride 103 98 - 111 mmol/L   CO2 21 (L) 22 - 32 mmol/L   Glucose, Bld 81 70 - 99 mg/dL   BUN 10 6 - 20 mg/dL   Creatinine, Ser 9.39 0.44 - 1.00 mg/dL   Calcium  9.2 8.9 - 10.3 mg/dL   Total Protein 6.1 (L) 6.5 - 8.1 g/dL   Albumin 3.5 3.5 - 5.0 g/dL   AST 15 15 - 41 U/L   ALT 7 0 - 44 U/L   Alkaline Phosphatase 175 (H) 38 - 126 U/L   Total Bilirubin 0.3 0.0 - 1.2 mg/dL   GFR, Estimated >39 >39 mL/min   Anion gap 10 5 - 15  CBC     Status: Abnormal   Collection Time: 01/29/24  1:49 PM  Result Value Ref Range   WBC 10.3 4.0 - 10.5 K/uL   RBC 3.15 (L) 3.87 - 5.11 MIL/uL   Hemoglobin 10.0 (L) 12.0 - 15.0 g/dL   HCT 70.5 (L) 63.9 - 53.9 %   MCV 93.3  80.0 - 100.0 fL   MCH 31.7 26.0 - 34.0 pg   MCHC 34.0 30.0 - 36.0 g/dL   RDW 87.6 88.4 - 84.4 %   Platelets 186 150 - 400 K/uL   nRBC 0.0 0.0 - 0.2 %     Treatments: analgesia: acetaminophen   Hospital Course:  This is a 29 y.o. [id number] with IUP at [redacted]w[redacted]d seen for c/o a headache.  She was given Tylenol  and her headache reduced from 3 to 2 out of 10.  BPs remained normotensive and labs were WNL. She was observed, fetal heart rate monitoring remained reassuring, and she had no signs/symptoms of  labor or other maternal-fetal concerns.  She was deemed stable for discharge to home with outpatient follow up.  IOL was also scheduled for Wed 12/10.  Discharge Physical Exam:  BP 124/79   Pulse (!) 102   Temp 98.3 F (36.8 C) (Oral)   LMP 05/18/2023  Vitals:   01/29/24 1325 01/29/24 1340 01/29/24 1355 01/29/24 1410  BP: 124/84 124/76 117/81 128/81   01/29/24 1425 01/29/24 1440 01/29/24 1455 01/29/24 1510  BP: 121/78 119/81 120/82 125/85   01/29/24 1525 01/29/24 1540 01/29/24 1555 01/29/24 1610  BP:  120/76 121/75 123/78 124/79      General: NAD CV: RRR Pulm: nl effort ABD: s/nd/nt, gravid DVT Evaluation: LE non-ttp, no evidence of DVT on exam.  NST: FHR baseline: 130 bpm Variability: moderate Accelerations: yes Decelerations: none Category/reactivity: reactive  TOCO: quiet SVE: deferred      Discharge Condition: Stable  Disposition:  Discharge disposition: 01-Home or Self Care        Allergies as of 01/29/2024   No Known Allergies      Medication List     STOP taking these medications    ondansetron  8 MG tablet Commonly known as: ZOFRAN    polyethylene glycol 17 g packet Commonly known as: MIRALAX  / GLYCOLAX    senna-docusate 8.6-50 MG tablet Commonly known as: Senokot-S       TAKE these medications    acetaminophen  500 MG tablet Commonly known as: TYLENOL  Take 2 tablets (1,000 mg total) by mouth every 6 (six) hours as needed.   aspirin EC 81 MG  tablet Take 81 mg by mouth daily. Swallow whole.   famotidine  20 MG tablet Commonly known as: PEPCID  Take 20 mg by mouth daily.   lamoTRIgine  200 MG tablet Commonly known as: LAMICTAL  Take 200 mg by mouth daily. What changed: Another medication with the same name was removed. Continue taking this medication, and follow the directions you see here.   PNV-DHA 27-0.6-0.4-300 MG Caps Take 1 capsule by mouth daily.   QUEtiapine  25 MG tablet Commonly known as: SEROQUEL  Take 25 mg by mouth at bedtime.        Follow-up Information     [facility] Follow up.   Why: Keep all scheduled appointments Contact information: [address] [place]  [address] [phone]                Signed:  DELON MYRON HOWARD 01/29/2024 4:39 PM

## 2024-01-29 NOTE — OB Triage Note (Signed)
 G3P2 at weeks reporting increased BP last night and today at home. Called office and was advised to come here for eval.

## 2024-01-31 ENCOUNTER — Observation Stay

## 2024-01-31 ENCOUNTER — Observation Stay (HOSPITAL_BASED_OUTPATIENT_CLINIC_OR_DEPARTMENT_OTHER)

## 2024-01-31 ENCOUNTER — Other Ambulatory Visit: Payer: Self-pay | Admitting: Certified Nurse Midwife

## 2024-01-31 DIAGNOSIS — O365931 Maternal care for other known or suspected poor fetal growth, third trimester, fetus 1: Secondary | ICD-10-CM

## 2024-01-31 DIAGNOSIS — O361930 Maternal care for other isoimmunization, third trimester, not applicable or unspecified: Secondary | ICD-10-CM

## 2024-01-31 DIAGNOSIS — O133 Gestational [pregnancy-induced] hypertension without significant proteinuria, third trimester: Secondary | ICD-10-CM | POA: Diagnosis not present

## 2024-01-31 DIAGNOSIS — Z3A36 36 weeks gestation of pregnancy: Secondary | ICD-10-CM

## 2024-01-31 DIAGNOSIS — Z3490 Encounter for supervision of normal pregnancy, unspecified, unspecified trimester: Secondary | ICD-10-CM

## 2024-02-01 ENCOUNTER — Inpatient Hospital Stay
Admission: EM | Admit: 2024-02-01 | Discharge: 2024-02-03 | DRG: 806 | Disposition: A | Discharge status: Home / Self Care | Payer: Self-pay | Source: Ambulatory Visit | Attending: Certified Nurse Midwife | Admitting: Certified Nurse Midwife

## 2024-02-01 ENCOUNTER — Encounter: Payer: Self-pay | Admitting: Obstetrics and Gynecology

## 2024-02-01 ENCOUNTER — Inpatient Hospital Stay: Admitting: Anesthesiology

## 2024-02-01 ENCOUNTER — Other Ambulatory Visit: Payer: Self-pay

## 2024-02-01 DIAGNOSIS — Z3490 Encounter for supervision of normal pregnancy, unspecified, unspecified trimester: Secondary | ICD-10-CM | POA: Diagnosis present

## 2024-02-01 DIAGNOSIS — Z79899 Other long term (current) drug therapy: Secondary | ICD-10-CM | POA: Diagnosis not present

## 2024-02-01 DIAGNOSIS — Z7982 Long term (current) use of aspirin: Secondary | ICD-10-CM | POA: Diagnosis not present

## 2024-02-01 DIAGNOSIS — O365930 Maternal care for other known or suspected poor fetal growth, third trimester, not applicable or unspecified: Secondary | ICD-10-CM | POA: Diagnosis present

## 2024-02-01 DIAGNOSIS — O26893 Other specified pregnancy related conditions, third trimester: Secondary | ICD-10-CM | POA: Diagnosis present

## 2024-02-01 DIAGNOSIS — Z3A37 37 weeks gestation of pregnancy: Secondary | ICD-10-CM | POA: Diagnosis not present

## 2024-02-01 DIAGNOSIS — O9903 Anemia complicating the puerperium: Secondary | ICD-10-CM | POA: Diagnosis not present

## 2024-02-01 DIAGNOSIS — K219 Gastro-esophageal reflux disease without esophagitis: Secondary | ICD-10-CM | POA: Diagnosis present

## 2024-02-01 DIAGNOSIS — O9962 Diseases of the digestive system complicating childbirth: Secondary | ICD-10-CM | POA: Diagnosis present

## 2024-02-01 DIAGNOSIS — D62 Acute posthemorrhagic anemia: Secondary | ICD-10-CM | POA: Diagnosis not present

## 2024-02-01 DIAGNOSIS — O9902 Anemia complicating childbirth: Secondary | ICD-10-CM | POA: Diagnosis present

## 2024-02-01 DIAGNOSIS — Z8249 Family history of ischemic heart disease and other diseases of the circulatory system: Secondary | ICD-10-CM | POA: Diagnosis not present

## 2024-02-01 DIAGNOSIS — O134 Gestational [pregnancy-induced] hypertension without significant proteinuria, complicating childbirth: Principal | ICD-10-CM | POA: Diagnosis present

## 2024-02-01 DIAGNOSIS — Z833 Family history of diabetes mellitus: Secondary | ICD-10-CM | POA: Diagnosis not present

## 2024-02-01 DIAGNOSIS — Z6791 Unspecified blood type, Rh negative: Secondary | ICD-10-CM | POA: Diagnosis not present

## 2024-02-01 LAB — CBC
HCT: 28.5 % — ABNORMAL LOW (ref 36.0–46.0)
Hemoglobin: 9.8 g/dL — ABNORMAL LOW (ref 12.0–15.0)
MCH: 32.3 pg (ref 26.0–34.0)
MCHC: 34.4 g/dL (ref 30.0–36.0)
MCV: 94.1 fL (ref 80.0–100.0)
Platelets: 194 K/uL (ref 150–400)
RBC: 3.03 MIL/uL — ABNORMAL LOW (ref 3.87–5.11)
RDW: 12.3 % (ref 11.5–15.5)
WBC: 9.6 K/uL (ref 4.0–10.5)
nRBC: 0 % (ref 0.0–0.2)

## 2024-02-01 LAB — SYPHILIS: RPR W/REFLEX TO RPR TITER AND TREPONEMAL ANTIBODIES, TRADITIONAL SCREENING AND DIAGNOSIS ALGORITHM: RPR Ser Ql: NONREACTIVE

## 2024-02-01 MED ORDER — MISOPROSTOL 25 MCG QUARTER TABLET
25.0000 ug | ORAL_TABLET | ORAL | Status: DC
  Administered 2024-02-01: 25 ug via ORAL
  Filled 2024-02-01: qty 1

## 2024-02-01 MED ORDER — EPHEDRINE 5 MG/ML INJ: 10.0000 mg | INTRAVENOUS | Status: DC | PRN

## 2024-02-01 MED ORDER — PHENYLEPHRINE 80 MCG/ML (10ML) SYRINGE FOR IV PUSH (FOR BLOOD PRESSURE SUPPORT): 80.0000 ug | PREFILLED_SYRINGE | INTRAVENOUS | Status: DC | PRN

## 2024-02-01 MED ORDER — PRENATAL MULTIVITAMIN CH
1.0000 | ORAL_TABLET | Freq: Every day | ORAL | Status: DC
  Administered 2024-02-02 – 2024-02-03 (×2): 1 via ORAL
  Filled 2024-02-01 (×3): qty 1

## 2024-02-01 MED ORDER — ACETAMINOPHEN 325 MG PO TABS
650.0000 mg | ORAL_TABLET | ORAL | Status: DC | PRN
  Administered 2024-02-02 – 2024-02-03 (×5): 650 mg via ORAL
  Filled 2024-02-01 (×5): qty 2

## 2024-02-01 MED ORDER — IBUPROFEN 600 MG PO TABS
600.0000 mg | ORAL_TABLET | Freq: Four times a day (QID) | ORAL | Status: DC
  Administered 2024-02-01 – 2024-02-03 (×6): 600 mg via ORAL
  Filled 2024-02-01 (×7): qty 1

## 2024-02-01 MED ORDER — TRANEXAMIC ACID-NACL 1000-0.7 MG/100ML-% IV SOLN
INTRAVENOUS | Status: AC
  Administered 2024-02-01: 1000 mg via INTRAVENOUS
  Filled 2024-02-01: qty 100

## 2024-02-01 MED ORDER — WITCH HAZEL-GLYCERIN EX PADS
1.0000 | MEDICATED_PAD | CUTANEOUS | Status: DC | PRN
  Administered 2024-02-01: 1 via TOPICAL
  Filled 2024-02-01: qty 100

## 2024-02-01 MED ORDER — FENTANYL CITRATE (PF) 100 MCG/2ML IJ SOLN
INTRAMUSCULAR | Status: AC
  Filled 2024-02-01: qty 2

## 2024-02-01 MED ORDER — LIDOCAINE HCL (PF) 1 % IJ SOLN
30.0000 mL | INTRAMUSCULAR | Status: DC | PRN
  Filled 2024-02-01: qty 30

## 2024-02-01 MED ORDER — OXYTOCIN 10 UNIT/ML IJ SOLN
INTRAMUSCULAR | Status: AC
  Filled 2024-02-01: qty 2

## 2024-02-01 MED ORDER — LACTATED RINGERS IV SOLN
500.0000 mL | Freq: Once | INTRAVENOUS | Status: AC
  Administered 2024-02-01: 500 mL via INTRAVENOUS

## 2024-02-01 MED ORDER — FENTANYL CITRATE (PF) 100 MCG/2ML IJ SOLN
INTRAMUSCULAR | Status: DC | PRN
  Administered 2024-02-01: 100 ug via EPIDURAL

## 2024-02-01 MED ORDER — TETANUS-DIPHTH-ACELL PERTUSSIS 5-2-15.5 LF-MCG/0.5 IM SUSP
0.5000 mL | Freq: Once | INTRAMUSCULAR | Status: DC
  Filled 2024-02-01: qty 0.5

## 2024-02-01 MED ORDER — LAMOTRIGINE 100 MG PO TABS
200.0000 mg | ORAL_TABLET | Freq: Every day | ORAL | Status: DC
  Administered 2024-02-02 – 2024-02-03 (×2): 200 mg via ORAL
  Filled 2024-02-01 (×2): qty 2

## 2024-02-01 MED ORDER — ONDANSETRON HCL 4 MG/2ML IJ SOLN
4.0000 mg | Freq: Four times a day (QID) | INTRAMUSCULAR | Status: DC | PRN
  Administered 2024-02-01: 4 mg via INTRAVENOUS
  Filled 2024-02-01: qty 2

## 2024-02-01 MED ORDER — OXYCODONE HCL 5 MG PO TABS
5.0000 mg | ORAL_TABLET | ORAL | Status: DC | PRN
  Administered 2024-02-01 – 2024-02-03 (×3): 5 mg via ORAL
  Filled 2024-02-01 (×3): qty 1

## 2024-02-01 MED ORDER — DIPHENHYDRAMINE HCL 25 MG PO CAPS: 25.0000 mg | ORAL_CAPSULE | Freq: Four times a day (QID) | ORAL | Status: DC | PRN

## 2024-02-01 MED ORDER — FENTANYL-BUPIVACAINE-NACL 0.5-0.125-0.9 MG/250ML-% EP SOLN
EPIDURAL | Status: DC | PRN
  Administered 2024-02-01: 12 mL/h via EPIDURAL

## 2024-02-01 MED ORDER — OXYTOCIN BOLUS FROM INFUSION
333.0000 mL | Freq: Once | INTRAVENOUS | Status: AC
  Administered 2024-02-01: 333 mL via INTRAVENOUS

## 2024-02-01 MED ORDER — FENTANYL CITRATE (PF) 100 MCG/2ML IJ SOLN: 50.0000 ug | INTRAMUSCULAR | Status: DC | PRN

## 2024-02-01 MED ORDER — BUPIVACAINE HCL (PF) 0.25 % IJ SOLN
INTRAMUSCULAR | Status: DC | PRN
  Administered 2024-02-01: 8 mL via EPIDURAL
  Administered 2024-02-01 (×2): 3 mL via EPIDURAL

## 2024-02-01 MED ORDER — MISOPROSTOL 200 MCG PO TABS
ORAL_TABLET | ORAL | Status: AC
  Filled 2024-02-01: qty 4

## 2024-02-01 MED ORDER — ACETAMINOPHEN 325 MG PO TABS
650.0000 mg | ORAL_TABLET | ORAL | Status: DC | PRN
  Administered 2024-02-01: 650 mg via ORAL
  Filled 2024-02-01: qty 2

## 2024-02-01 MED ORDER — MISOPROSTOL 25 MCG QUARTER TABLET
25.0000 ug | ORAL_TABLET | ORAL | Status: DC
  Administered 2024-02-01: 25 ug via VAGINAL
  Filled 2024-02-01: qty 1

## 2024-02-01 MED ORDER — FERROUS SULFATE 325 (65 FE) MG PO TABS
325.0000 mg | ORAL_TABLET | Freq: Two times a day (BID) | ORAL | Status: DC
  Administered 2024-02-02 – 2024-02-03 (×3): 325 mg via ORAL
  Filled 2024-02-01 (×3): qty 1

## 2024-02-01 MED ORDER — OXYCODONE HCL 5 MG PO TABS: 10.0000 mg | ORAL_TABLET | ORAL | Status: DC | PRN

## 2024-02-01 MED ORDER — QUETIAPINE FUMARATE 25 MG PO TABS
25.0000 mg | ORAL_TABLET | Freq: Every day | ORAL | Status: DC
  Administered 2024-02-02: 25 mg via ORAL
  Filled 2024-02-01 (×2): qty 1

## 2024-02-01 MED ORDER — TERBUTALINE SULFATE 1 MG/ML IJ SOLN: 0.2500 mg | Freq: Once | INTRAMUSCULAR | Status: DC | PRN

## 2024-02-01 MED ORDER — ONDANSETRON HCL 4 MG PO TABS: 4.0000 mg | ORAL_TABLET | ORAL | Status: DC | PRN

## 2024-02-01 MED ORDER — SOD CITRATE-CITRIC ACID 500-334 MG/5ML PO SOLN: 30.0000 mL | ORAL | Status: DC | PRN

## 2024-02-01 MED ORDER — LACTATED RINGERS IV SOLN: INTRAVENOUS | Status: DC

## 2024-02-01 MED ORDER — ONDANSETRON HCL 4 MG/2ML IJ SOLN
4.0000 mg | INTRAMUSCULAR | Status: DC | PRN
  Administered 2024-02-01: 4 mg via INTRAVENOUS
  Filled 2024-02-01: qty 2

## 2024-02-01 MED ORDER — SIMETHICONE 80 MG PO CHEW: 80.0000 mg | CHEWABLE_TABLET | ORAL | Status: DC | PRN

## 2024-02-01 MED ORDER — DIPHENHYDRAMINE HCL 50 MG/ML IJ SOLN: 12.5000 mg | INTRAMUSCULAR | Status: DC | PRN

## 2024-02-01 MED ORDER — LIDOCAINE-EPINEPHRINE (PF) 1.5 %-1:200000 IJ SOLN
INTRAMUSCULAR | Status: DC | PRN
  Administered 2024-02-01: 3 mL via PERINEURAL

## 2024-02-01 MED ORDER — OXYTOCIN-SODIUM CHLORIDE 30-0.9 UT/500ML-% IV SOLN
1.0000 m[IU]/min | INTRAVENOUS | Status: DC
  Administered 2024-02-01: 2 m[IU]/min via INTRAVENOUS
  Filled 2024-02-01 (×2): qty 500

## 2024-02-01 MED ORDER — FENTANYL-BUPIVACAINE-NACL 0.5-0.125-0.9 MG/250ML-% EP SOLN
12.0000 mL/h | EPIDURAL | Status: DC | PRN
  Filled 2024-02-01: qty 250

## 2024-02-01 MED ORDER — SENNOSIDES-DOCUSATE SODIUM 8.6-50 MG PO TABS
2.0000 | ORAL_TABLET | Freq: Every day | ORAL | Status: DC
  Administered 2024-02-02 – 2024-02-03 (×2): 2 via ORAL
  Filled 2024-02-01 (×2): qty 2

## 2024-02-01 MED ORDER — BENZOCAINE-MENTHOL 20-0.5 % EX AERO
1.0000 | INHALATION_SPRAY | CUTANEOUS | Status: DC | PRN
  Administered 2024-02-01: 1 via TOPICAL
  Filled 2024-02-01: qty 56

## 2024-02-01 MED ORDER — COCONUT OIL OIL
1.0000 | TOPICAL_OIL | Status: DC | PRN
  Administered 2024-02-01: 1 via TOPICAL
  Filled 2024-02-01: qty 7.5

## 2024-02-01 MED ORDER — TRANEXAMIC ACID-NACL 1000-0.7 MG/100ML-% IV SOLN: 1000.0000 mg | Freq: Once | INTRAVENOUS | Status: AC

## 2024-02-01 MED ORDER — AMMONIA AROMATIC IN INHA
RESPIRATORY_TRACT | Status: AC
  Filled 2024-02-01: qty 10

## 2024-02-01 MED ORDER — LIDOCAINE HCL (CARDIAC) PF 50 MG/5ML IV SOSY
PREFILLED_SYRINGE | INTRAVENOUS | Status: DC | PRN
  Administered 2024-02-01: 2 mL via INTRAVENOUS
  Administered 2024-02-01: 3 mL via INTRAVENOUS

## 2024-02-01 MED ORDER — METHYLERGONOVINE MALEATE 0.2 MG/ML IJ SOLN: 0.2000 mg | INTRAMUSCULAR | Status: DC | PRN

## 2024-02-01 MED ORDER — LACTATED RINGERS IV SOLN
500.0000 mL | INTRAVENOUS | Status: DC | PRN
  Administered 2024-02-01 (×2): 500 mL via INTRAVENOUS

## 2024-02-01 MED ORDER — DIBUCAINE (PERIANAL) 1 % EX OINT
1.0000 | TOPICAL_OINTMENT | CUTANEOUS | Status: DC | PRN
  Administered 2024-02-01: 1 via RECTAL
  Filled 2024-02-01: qty 28

## 2024-02-01 MED ORDER — METHYLERGONOVINE MALEATE 0.2 MG PO TABS: 0.2000 mg | ORAL_TABLET | ORAL | Status: DC | PRN

## 2024-02-01 MED ORDER — OXYTOCIN-SODIUM CHLORIDE 30-0.9 UT/500ML-% IV SOLN: 2.5000 [IU]/h | INTRAVENOUS | Status: DC

## 2024-02-01 MED ORDER — MISOPROSTOL 200 MCG PO TABS
800.0000 ug | ORAL_TABLET | Freq: Once | ORAL | Status: AC
  Administered 2024-02-01: 800 ug via RECTAL

## 2024-02-01 NOTE — Progress Notes (Signed)
 Sarah Haney is a 28 y.o. G3P2002 at [redacted]w[redacted]d Gestational hypertension and concern for IUGR - last u/s though efw 26% Subjective: Cle working well  Objective: BP 138/79 (BP Location: Right Arm)   Pulse 97   Temp 99 F (37.2 C) (Oral)   Resp 16   Ht 5' 5 (1.651 m)   Wt 71.7 kg   LMP 05/18/2023   BMI 26.29 kg/m  No intake/output data recorded. Total I/O In: 360 [P.O.:360] Out: -   FHT:  FHR: 130 bpm, variability: moderate,  accelerations:  Present,  decelerations:  Absent UC:   regular, every 3-4 minutes SVE:   Dilation: 4 Effacement (%): 70 Station: 0 Exam by:: ONEIDA Lukes, RN My exam AROM : ++ fluid Cx 5 cm / 80%/0 vtx  Labs: Lab Results  Component Value Date   WBC 9.6 02/01/2024   HGB 9.8 (L) 02/01/2024   HCT 28.5 (L) 02/01/2024   MCV 94.1 02/01/2024   PLT 194 02/01/2024    Assessment / Plan: Spontaneous labor, progressing normally  Labor: Progressing normally Preeclampsia:  n/a Fetal Wellbeing:  Category I Pain Control:  Epidural I/D:  n/a Anticipated MOD:  NSVD  [name], MD 02/01/2024, 2:14 PM

## 2024-02-01 NOTE — Anesthesia Preprocedure Evaluation (Addendum)
 Anesthesia Evaluation  Patient identified by MRN, date of birth, ID band Patient awake    Reviewed: Allergy & Precautions, Patient's Chart, lab work & pertinent test results  History of Anesthesia Complications (+) Family history of anesthesia reactionNegative for: history of anesthetic complications  Airway Mallampati: II  TM Distance: >3 FB Neck ROM: Full    Dental  (+) Teeth Intact   Pulmonary neg pulmonary ROS          Cardiovascular hypertension, + dysrhythmias + Valvular Problems/Murmurs  Rate:Normal  Heart murmur, tachycardia   Neuro/Psych Seizures - (Patient states possible seizures a few years ago, was initially placed on meds but has been off of medications for 2 years with no issues), Well Controlled,   Anxiety Depression       GI/Hepatic ,GERD  ,,  Endo/Other    Renal/GU      Musculoskeletal Scoliosis   Abdominal   Peds  Hematology   Anesthesia Other Findings   Reproductive/Obstetrics (+) Pregnancy                              Anesthesia Physical Anesthesia Plan  ASA: 2  Anesthesia Plan: Epidural   Post-op Pain Management:    Induction:   PONV Risk Score and Plan:   Airway Management Planned:   Additional Equipment:   Intra-op Plan:   Post-operative Plan:   Informed Consent: I have reviewed the patients History and Physical, chart, labs and discussed the procedure including the risks, benefits and alternatives for the proposed anesthesia with the patient or authorized representative who has indicated his/her understanding and acceptance.       Plan Discussed with: Anesthesiologist  Anesthesia Plan Comments: (Questions answered, consent obtained.  Pt elects to proceed.)         Anesthesia Quick Evaluation

## 2024-02-01 NOTE — Progress Notes (Signed)
 Patient ID: Sarah Haney, female   DOB: 09/29/1995, 28 y.o.   MRN: [id number] A few variable decles noted  I placed IUPC and FSE  Cx 5cm /90 % / +1  Cat II currently . Continue observation
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
## 2024-02-01 NOTE — Anesthesia Procedure Notes (Signed)
 Epidural Patient location during procedure: OB Start time: 02/01/2024 12:17 PM End time: 02/01/2024 12:52 PM  Staffing Anesthesiologist: Dario Barter, MD Resident/CRNA: Dyane Mass, CRNA Performed: resident/CRNA   Preanesthetic Checklist Completed: patient identified, IV checked, site marked, risks and benefits discussed, surgical consent, monitors and equipment checked and pre-op evaluation  Epidural Patient position: sitting Prep: ChloraPrep Patient monitoring: heart rate, blood pressure and continuous pulse ox Approach: midline Location: L4-L5 Injection technique: LOR air and LOR saline  Needle:  Needle type: Tuohy  Needle gauge: 17 G Needle length: 9 cm Needle insertion depth: 8 cm Catheter type: closed end flexible Catheter at skin depth: 13 cm Test dose: 1.5% lidocaine  with Epi 1:200 K  Assessment Events: blood not aspirated, no cerebrospinal fluid, injection not painful, no injection resistance, no paresthesia and negative IV test  Additional Notes Reason for block:procedure for pain

## 2024-02-01 NOTE — Discharge Summary (Signed)
 Postpartum Discharge Summary  Patient Name: Sarah Haney DOB: 1995-11-09 MRN: [id number]  Date of admission: 02/01/2024 Delivery date:02/01/2024 Delivering provider: MYRON NEST Date of discharge: 02/03/2024  Primary OB: [facility] OFE:Ejupzwu'd last menstrual period was 05/18/2023. EDC Estimated Date of Delivery: 02/22/24 Gestational Age at Delivery: [redacted]w[redacted]d   Admitting diagnosis: Encounter for induction of labor [Z34.90] Intrauterine pregnancy: [redacted]w[redacted]d     Secondary diagnosis:   Principal Problem:   NSVD (normal spontaneous vaginal delivery) Active Problems:   Encounter for induction of labor   Discharge Diagnosis: Term Pregnancy Delivered, Gestational Hypertension, and Anemia      Hospital course: Induction of Labor With Vaginal Delivery   28 y.o. yo G3P3003 at [redacted]w[redacted]d was admitted to the hospital 02/01/2024 for induction of labor.  Indication for induction: Gestational hypertension.  Patient had an labor course complicated by none. Membrane Rupture Time/Date: 2:08 PM,02/01/2024  Delivery Method:Vaginal, Spontaneous Operative Delivery:N/A Episiotomy: None Lacerations:  None Details of delivery can be found in separate delivery note.  Patient had a postpartum course complicated by low hemoglobin. Patient is discharged home 02/03/2024.  Newborn Data: Birth date:02/01/2024 Birth time:7:25 PM Gender:Female Living status:Living Apgars:9 ,9  Weight:2840 g                                            Post partum procedures:Venofer   Induction:: AROM, Pitocin , and Cytotec  Complications: None Delivery Type: spontaneous vaginal delivery Anesthesia: epidural anesthesia Placenta: spontaneous To Pathology: No   Prenatal Labs:  Blood type/Rh O neg  Antibody screen POS  Rubella Immune  Varicella Immune  RPR NR  HBsAg Neg  HIV NR  GC neg  Chlamydia neg  Genetic screening negative  1 hour GTT 107  3 hour GTT    GBS Neg    Magnesium  Sulfate received: No BMZ  received: No Rhophylac :was given MMR: was not indicated Varivax vaccine given: was not indicated T-DaP:Given prenatally Flu: declined prenatally   Transfusion:No  Physical exam  Vitals:   02/02/24 1628 02/02/24 2011 02/03/24 0155 02/03/24 0829  BP: 127/88 134/80 115/74 120/83  Pulse: 78 72 76 68  Resp: 18 20 17 18   Temp: 98.2 F (36.8 C) 98.4 F (36.9 C) 98 F (36.7 C) 98.3 F (36.8 C)  TempSrc: Oral   Oral  SpO2: 98% 98% 100% 100%  Weight:      Height:       General: alert, cooperative, and no distress Lochia: appropriate Uterine Fundus: firm Perineum:minimal edema/intact Incision: N/A DVT Evaluation: No evidence of DVT seen on physical exam.  Labs: Lab Results  Component Value Date   WBC 10.6 (H) 02/02/2024   HGB 8.4 (L) 02/02/2024   HCT 24.9 (L) 02/02/2024   MCV 94.7 02/02/2024   PLT 163 02/02/2024      Latest Ref Rng & Units 01/29/2024    1:49 PM  CMP  Glucose 70 - 99 mg/dL 81   BUN 6 - 20 mg/dL 10   Creatinine 9.55 - 1.00 mg/dL 9.39   Sodium 864 - 854 mmol/L 134   Potassium 3.5 - 5.1 mmol/L 4.1   Chloride 98 - 111 mmol/L 103   CO2 22 - 32 mmol/L 21   Calcium  8.9 - 10.3 mg/dL 9.2   Total Protein 6.5 - 8.1 g/dL 6.1   Total Bilirubin 0.0 - 1.2 mg/dL 0.3   Alkaline Phos 38 - 126 U/L 175  AST 15 - 41 U/L 15   ALT 0 - 44 U/L 7    Edinburgh Score:     No data to display          Risk assessment for postpartum VTE and prophylactic treatment: Very high risk factors: None High risk factors: None Moderate risk factors: None  Postpartum VTE prophylaxis with LMWH not indicated  After visit meds:  Allergies as of 02/03/2024   No Known Allergies      Medication List     STOP taking these medications    aspirin EC 81 MG tablet       TAKE these medications    acetaminophen  500 MG tablet Commonly known as: TYLENOL  Take 2 tablets (1,000 mg total) by mouth every 6 (six) hours as needed.   famotidine  20 MG tablet Commonly known as:  PEPCID  Take 20 mg by mouth daily.   ferrous sulfate  325 (65 FE) MG tablet Take 1 tablet (325 mg total) by mouth daily with breakfast.   ibuprofen  600 MG tablet Commonly known as: ADVIL  Take 1 tablet (600 mg total) by mouth every 6 (six) hours as needed.   lamoTRIgine  200 MG tablet Commonly known as: LAMICTAL  Take 200 mg by mouth daily.   PNV-DHA 27-0.6-0.4-300 MG Caps Take 1 capsule by mouth daily.   QUEtiapine  25 MG tablet Commonly known as: SEROQUEL  Take 25 mg by mouth at bedtime.       Discharge home in stable condition Infant Feeding: Breast Infant Disposition:rooming in Discharge instruction: per After Visit Summary and Postpartum booklet. Activity: Advance as tolerated. Pelvic rest for 6 weeks.  Diet: routine diet Anticipated Birth Control: Unsure Postpartum Appointment:6 weeks Additional Postpartum F/U: Postpartum Depression checkup Future Appointments:No future appointments. Follow up Visit:  Follow-up Information     Myron Nest, CNM. Schedule an appointment as soon as possible for a visit in 6 week(s).   Specialty: Certified Nurse Midwife Contact information: [address] [place] [address] [phone]         Myron Nest, CNM. Schedule an appointment as soon as possible for a visit in 1 week(s).   Specialty: Certified Nurse Midwife Why: for a mood check Contact information: [address] [place] [address] [phone]                 Plan:  Sarah Haney was discharged to home in good condition. Follow-up appointment as directed.    SignedBETHA Margery FORBES Myron 02/03/2024 9:39 AM

## 2024-02-01 NOTE — Progress Notes (Signed)
 Left sided abdominal pain with contractions. No motor blockade. Level to ice was T10 R and T12 L. Will bolus epidural as charted.  Camellia Louder MD ANES

## 2024-02-02 ENCOUNTER — Encounter: Payer: Self-pay | Admitting: Obstetrics and Gynecology

## 2024-02-02 LAB — BPAM RBC
Blood Product Expiration Date: 202512302359
Unit Type and Rh: 9500

## 2024-02-02 LAB — CBC
HCT: 24.9 % — ABNORMAL LOW (ref 36.0–46.0)
Hemoglobin: 8.4 g/dL — ABNORMAL LOW (ref 12.0–15.0)
MCH: 31.9 pg (ref 26.0–34.0)
MCHC: 33.7 g/dL (ref 30.0–36.0)
MCV: 94.7 fL (ref 80.0–100.0)
Platelets: 163 K/uL (ref 150–400)
RBC: 2.63 MIL/uL — ABNORMAL LOW (ref 3.87–5.11)
RDW: 12.3 % (ref 11.5–15.5)
WBC: 10.6 K/uL — ABNORMAL HIGH (ref 4.0–10.5)
nRBC: 0 % (ref 0.0–0.2)

## 2024-02-02 MED ORDER — IRON SUCROSE 300 MG IVPB - SIMPLE MED
300.0000 mg | Freq: Once | Status: AC
  Administered 2024-02-02: 300 mg via INTRAVENOUS
  Filled 2024-02-02: qty 300

## 2024-02-02 NOTE — Lactation Note (Addendum)
 This note was copied from a baby's chart. Lactation Consultation Note  Patient Name: Sarah Haney Date: 02/02/2024 Age:27 hours Reason for consult: Initial assessment;Early term 37-38.6wks;Breastfeeding assistance;RN request   Maternal Data Does the patient have breastfeeding experience prior to this delivery?: Yes How long did the patient breastfeed?: Did not breastfeed first, breastfed 2nd for 12wks  Initial assessment w/ a 15hr old baby Sarah and parents.  This was a SVD.  Patient w/ a hx of anxiety/depression, POTS, non-epileptic seizures, hx of PPH, IUGR at 26wks, and gestational hypertension at 26wks.    Mom stated that she has a breastpump at home.  Feeding Mother's Current Feeding Choice: Breast Milk  Lactation called into room to assist w/ a syringe feed.  Infant was fed 1ml of colostrum via a syringe.  Patient then formula fed infant paced feeding.   Lactation Tools Discussed/Used Patient set up w/ a DEBP overnight.  Lactation reviewed the use of pump, and the manual pump in kit.  LC will measure moms nipples at next feeding to make sure she is using proper flange sizes.  Interventions Interventions: Breast feeding basics reviewed;DEBP;Education  LC provided education on the following;  milk production expectations, hunger cues, day 1/2 wet/dirty diapers, hand expression, cluster feeding, benefits of STS and arousing infant for a feeding.  Lactation informed patient of feeding infant at least 8 or more times w/in a 24hr period but not exceeding 3hrs. Patient verbalized understanding.   Discharge Pump: Personal;DEBP;Hands Sarah Haney  Consult Status Consult Status: Follow-up Follow-up type: In-patient    Sarah Haney 02/02/2024, 11:08 AM

## 2024-02-02 NOTE — Progress Notes (Signed)
 Post Partum Day 1 Subjective: Doing well, no complaints.  Tolerating regular diet, pain with PO meds, voiding and ambulating without difficulty.  No CP SOB Fever,Chills, N/V or leg pain; denies nipple or breast pain, no HA change of vision, RUQ/epigastric pain  Objective: BP 110/66 (BP Location: Right Arm)   Pulse 90   Temp 98.4 F (36.9 C) (Oral)   Resp 18   Ht 5' 5 (1.651 m)   Wt 71.7 kg   LMP 05/18/2023   SpO2 100%   Breastfeeding Unknown   BMI 26.29 kg/m    Physical Exam:  General: NAD Breasts: soft/nontender CV: RRR Pulm: nl effort, CTABL Abdomen: soft, NT, BS x 4 Perineum: minimal edema, intact Lochia: moderate Uterine Fundus: fundus firm and 1 fb below umbilicus DVT Evaluation: no cords, ttp LEs   Recent Labs    02/01/24 0545 02/02/24 0516  HGB 9.8* 8.4*  HCT 28.5* 24.9*  WBC 9.6 10.6*  PLT 194 163    Assessment/Plan: 28 y.o. G3P3003 postpartum day # 1  - Continue routine PP care - Lactation consult PRN - Discussed contraceptive options including implant, IUDs hormonal and non-hormonal, injection, pills/ring/patch, condoms, and NFP.  - Acute blood loss anemia, clinically significant - hemoglobin changed from 9.8 to 8.4, patient is asymptomatic, hemodynamically stable; start po ferrous sulfate  BID with stool softeners, administer Venofer IV  - Immunization status: all Imms up to date  Disposition: Does not desire Dc home today.   Edsel [name], CNM 02/02/2024 2:43 PM

## 2024-02-02 NOTE — Anesthesia Postprocedure Evaluation (Signed)
 Anesthesia Post Note  Patient: Sarah Haney  Procedure(s) Performed: AN AD HOC LABOR EPIDURAL  Patient location during evaluation: Mother Baby Anesthesia Type: Epidural Level of consciousness: awake and alert Pain management: satisfactory to patient Vital Signs Assessment: post-procedure vital signs reviewed and stable Respiratory status: spontaneous breathing Cardiovascular status: stable Postop Assessment: patient able to bend at knees, no apparent nausea or vomiting, adequate PO intake and able to ambulate Anesthetic complications: no Comments: Has been able to void.   No notable events documented.   Last Vitals:  Vitals:   02/02/24 0105 02/02/24 0320  BP: 117/85 122/80  Pulse: 86 85  Resp: 17 17  Temp: 36.9 C 37 C  SpO2: 99% 99%    Last Pain:  Vitals:   02/02/24 0415  TempSrc:   PainSc: Asleep                 Roselee Hint

## 2024-02-03 LAB — FETAL SCREEN: Fetal Screen: NEGATIVE

## 2024-02-03 MED ORDER — FERROUS SULFATE 325 (65 FE) MG PO TABS: 325.0000 mg | ORAL_TABLET | Freq: Every day | ORAL | Status: AC

## 2024-02-03 MED ORDER — IBUPROFEN 600 MG PO TABS: 600.0000 mg | ORAL_TABLET | Freq: Four times a day (QID) | ORAL | Status: AC | PRN

## 2024-02-03 MED ORDER — RHO D IMMUNE GLOBULIN 1500 UNIT/2ML IJ SOSY
300.0000 ug | PREFILLED_SYRINGE | Freq: Once | INTRAMUSCULAR | Status: AC
  Administered 2024-02-03: 300 ug via INTRAMUSCULAR
  Filled 2024-02-03: qty 2

## 2024-02-03 NOTE — Plan of Care (Signed)
 Pt progressing towards goals.

## 2024-02-03 NOTE — Discharge Instructions (Signed)
 Mother's Discharge Instructions: Call office if you have any of the following:  headache, visual changes, fever >101.0 F, chills, breast concerns (engorgement, mastitis) excessive vaginal bleeding, incision drainage or problems, leg pain or redness, depression or any other concerns.   Activity: Do not lift > 10 lbs for 6 weeks.  No intercourse or tampons for 6 weeks.  No driving for 1-2 weeks or while taking pain medication. No strenuous activity or heavy lifting for 6 weeks.  No swimming pools, hot tubs or tub baths- showers only.    It is normal to bleed for up to 6 weeks. You should not soak through more than 1 pad in 1 hour.   Continue prenatal vitamin. Increase calories and fluids while breastfeeding.  Your milk will come in, in the next couple of days (right now it is colostrum).  You may have a slight fever when your milk comes in, but it should go away on its own.   If it does not, and rises above 101 F please call the doctor.  You will also feel achy and your breasts will be firm. They will also start to leak.  If you are breastfeeding, continue as you have been and you can pump/express milk for comfort.   For concerns about your baby, please call your pediatrician For breastfeeding concerns, the lactation consultant can be reached at [phone]  Postpartum blues (feelings of happy one minute and sad another minute) are normal for the first few weeks but if it gets worse let your doctor know.

## 2024-02-03 NOTE — Plan of Care (Signed)
 Adequate for discharge.

## 2024-02-03 NOTE — Lactation Note (Signed)
 This note was copied from a baby's chart. Lactation Consultation Note  Patient Name: Sarah Haney Unijb'd Date: 02/03/2024 Age:29 hours Reason for consult: Follow-up assessment;Early term 37-38.6wks;Hyperbilirubinemia   Maternal Data Does the patient have breastfeeding experience prior to this delivery?: Yes How long did the patient breastfeed?: 12 wks  Feeding Mother's Current Feeding Choice: Breast Milk and Formula Nipple Type: Slow - flow Mom states that baby has been very sleepy since bili blanket started with jaundice, offers her breast first then formula after, states baby will not nurse for long, sucks a few times and falls asleep at breast, she has pumped her breasts a little but not consistent as she has pain in her back from epidural and sitting up pumping makes it worse, encouraged her to do the best that she can. Encouraged mom to call for assistance if she needs help with latching baby to breast.  LATCH Score Latch:  (no latch observed, baby asleep on bili blanket)                  Lactation Tools Discussed/Used Tools: Pump Breast pump type: Double-Electric Breast Pump Reason for Pumping: baby under bili lites, early term baby Pumping frequency: encouraged to pump breasts q3h or 8x/24 hr  Interventions Interventions: Education;DEBP LC name updated on white board Discharge Pump: DEBP;Personal  Consult Status Consult Status: PRN Date: 02/04/24 Follow-up type: In-patient    Sarah Haney 02/03/2024, 3:05 PM

## 2024-02-04 LAB — BPAM RBC
Blood Product Expiration Date: 202512302359
Unit Type and Rh: 9500

## 2024-02-04 LAB — RHOGAM INJECTION: Unit division: 0

## 2024-02-06 NOTE — Lactation Note (Signed)
 This note was copied from a baby's chart. Lactation Consultation Note  Patient Name: Sarah Haney Unijb'd Date: 02/06/2024 Age:28 days Reason for consult: Follow-up assessment;Early term 37-38.6wks;Exclusive pumping and bottle feeding (For now mom prefers to exclusively pump and bottle feed. She may consider breastfeeding in the future and understands she can call [facility] for outpatient lactation assistance.)   Maternal Data This is mom's 3rd baby, SVD. Mom with history of anxiety, depression, non-epileptic seizures, gestational hypertension, IUGR at 26 weeks, POTS. 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  At follow-up visit today mom reports she prefers to bottle feed for now and will reconsider direct breastfeeding in the future. Per mom she has been pumping but is experiencing tightness and lumps in her breast. Mom has not been consistently pumping. Has patient been taught Hand Expression?: Yes Does the patient have breastfeeding experience prior to this delivery?: Yes How long did the patient breastfeed?: Did not breastfeed 1st baby, breastfed 2nd child for 12 weeks.  Feeding Mother's Current Feeding Choice: Breast Milk and Formula (Mom using formula as a bridge until her milk volumes increase. Baby received treatment for hyperbilirubinemia and required supplementation.) Nipple Type: Slow - flow   Lactation Tools Discussed/Used  Maximizing pumping routine to establish a full milk supply. Encouraged mom to goal to pump at least 8 times in 24 hours. Reviewed how the body knows to make milk and the importance of consistent pumping in the early post partum period.  Interventions Interventions: Education;Ice;DEBP  Discharge  Pump: personal, hands free Reviewed engorgement management and sore nipples, when to call the Pediatrician, outpatient lactation resource.  Consult Status   Complete Inpatient 02/06/24  Update provided to care nurse.  [name] 02/06/2024, 5:07 PM

## 2024-02-10 ENCOUNTER — Emergency Department

## 2024-02-10 ENCOUNTER — Emergency Department
Admission: EM | Admit: 2024-02-10 | Discharge: 2024-02-10 | Disposition: A | Discharge status: Home / Self Care | Attending: Emergency Medicine | Admitting: Emergency Medicine

## 2024-02-10 ENCOUNTER — Other Ambulatory Visit: Payer: Self-pay

## 2024-02-10 DIAGNOSIS — R109 Unspecified abdominal pain: Secondary | ICD-10-CM

## 2024-02-10 DIAGNOSIS — R1031 Right lower quadrant pain: Secondary | ICD-10-CM | POA: Diagnosis present

## 2024-02-10 LAB — COMPREHENSIVE METABOLIC PANEL WITH GFR
ALT: 84 U/L — ABNORMAL HIGH (ref 0–44)
AST: 17 U/L (ref 15–41)
Albumin: 3.8 g/dL (ref 3.5–5.0)
Alkaline Phosphatase: 416 U/L — ABNORMAL HIGH (ref 38–126)
Anion gap: 12 (ref 5–15)
BUN: 21 mg/dL — ABNORMAL HIGH (ref 6–20)
CO2: 25 mmol/L (ref 22–32)
Calcium: 9.3 mg/dL (ref 8.9–10.3)
Chloride: 103 mmol/L (ref 98–111)
Creatinine, Ser: 0.63 mg/dL (ref 0.44–1.00)
GFR, Estimated: >60 mL/min
Glucose, Bld: 95 mg/dL (ref 70–99)
Potassium: 4.1 mmol/L (ref 3.5–5.1)
Sodium: 139 mmol/L (ref 135–145)
Total Bilirubin: <0.2 mg/dL (ref 0.0–1.2)
Total Protein: 7.3 g/dL (ref 6.5–8.1)

## 2024-02-10 LAB — URINALYSIS, ROUTINE W REFLEX MICROSCOPIC
Bacteria, UA: NONE SEEN
Bilirubin Urine: NEGATIVE
Glucose, UA: NEGATIVE mg/dL
Ketones, ur: NEGATIVE mg/dL
Nitrite: NEGATIVE
Protein, ur: NEGATIVE mg/dL
Specific Gravity, Urine: 1.019 (ref 1.005–1.030)
pH: 6 (ref 5.0–8.0)

## 2024-02-10 LAB — CBC
HCT: 34.5 % — ABNORMAL LOW (ref 36.0–46.0)
Hemoglobin: 10.9 g/dL — ABNORMAL LOW (ref 12.0–15.0)
MCH: 31.1 pg (ref 26.0–34.0)
MCHC: 31.6 g/dL (ref 30.0–36.0)
MCV: 98.6 fL (ref 80.0–100.0)
Platelets: 465 K/uL — ABNORMAL HIGH (ref 150–400)
RBC: 3.5 MIL/uL — ABNORMAL LOW (ref 3.87–5.11)
RDW: 13.2 % (ref 11.5–15.5)
WBC: 13 K/uL — ABNORMAL HIGH (ref 4.0–10.5)
nRBC: 0 % (ref 0.0–0.2)

## 2024-02-10 LAB — POC URINE PREG, ED: Preg Test, Ur: NEGATIVE

## 2024-02-10 LAB — LIPASE, BLOOD: Lipase: 20 U/L (ref 11–51)

## 2024-02-10 MED ORDER — KETOROLAC TROMETHAMINE 15 MG/ML IJ SOLN
15.0000 mg | Freq: Once | INTRAMUSCULAR | Status: AC
  Administered 2024-02-10: 15 mg via INTRAVENOUS
  Filled 2024-02-10: qty 1

## 2024-02-10 MED ORDER — IOHEXOL 300 MG/ML  SOLN
100.0000 mL | Freq: Once | INTRAMUSCULAR | Status: AC | PRN
  Administered 2024-02-10: 100 mL via INTRAVENOUS

## 2024-02-10 MED ORDER — SODIUM CHLORIDE 0.9 % IV BOLUS
1000.0000 mL | Freq: Once | INTRAVENOUS | Status: AC
  Administered 2024-02-10: 1000 mL via INTRAVENOUS

## 2024-02-10 MED ORDER — OXYCODONE HCL 5 MG PO TABS
5.0000 mg | ORAL_TABLET | ORAL | Status: AC
  Administered 2024-02-10: 5 mg via ORAL
  Filled 2024-02-10: qty 1

## 2024-02-10 MED ORDER — MORPHINE SULFATE (PF) 4 MG/ML IV SOLN
4.0000 mg | Freq: Once | INTRAVENOUS | Status: AC
  Administered 2024-02-10: 4 mg via INTRAVENOUS
  Filled 2024-02-10: qty 1

## 2024-02-10 MED ORDER — OXYCODONE HCL 5 MG PO TABS: 5.0000 mg | ORAL_TABLET | ORAL | 0 refills | Status: AC | PRN

## 2024-02-10 MED ORDER — ONDANSETRON HCL 4 MG/2ML IJ SOLN
4.0000 mg | Freq: Once | INTRAMUSCULAR | Status: AC
  Administered 2024-02-10: 4 mg via INTRAVENOUS
  Filled 2024-02-10: qty 2

## 2024-02-10 NOTE — ED Provider Triage Note (Signed)
 Emergency Medicine Provider Triage Evaluation Note  Sarah Haney , a 28 y.o. female  was evaluated in triage.  Pt complains of right lower quadrant pain for the past 5 days.  Uncomplicated vaginal delivery on 12/11.  Bleeding has nearly stopped.  Right lower quadrant pain is worse with movement, cough, or laugh.  Developed a low-grade fever 3 days ago.  No nausea, vomiting, or diarrhea.   Physical Exam  BP 108/81 (BP Location: Left Arm)   Pulse (!) 110   Temp 98.1 F (36.7 C) (Oral)   Resp 20   Ht 5' 5 (1.651 m)   Wt 69.9 kg   LMP 05/18/2023   SpO2 97%   Breastfeeding Yes   BMI 25.63 kg/m  Gen:   Awake, no distress   Resp:  Normal effort  MSK:   Moves extremities without difficulty  Other:    Medical Decision Making  Medically screening exam initiated at 3:07 PM.  Appropriate orders placed.  TEQUILA ROTTMANN was informed that the remainder of the evaluation will be completed by another provider, this initial triage assessment does not replace that evaluation, and the importance of remaining in the ED until their evaluation is complete.    Herlinda Kirk NOVAK, FNP 02/10/24 1546

## 2024-02-10 NOTE — ED Triage Notes (Signed)
 Pt to ED for RLQ abdominal pain since Monday (5d). Had uncomplicated vaginal birth on 12/11 and is currently breastfeeding. Minimal vaginal bleeding. Pain is worse with movement and coughing or laughing. Denies NVD. Does have appendix. Appears well.

## 2024-02-10 NOTE — Discharge Instructions (Addendum)
 Your lab test, CT scan, and pelvic ultrasound were all reassuring today.  Continue managing your pain at home and follow-up with your OB team if symptoms are not improving.
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
## 2024-03-08 ENCOUNTER — Ambulatory Visit

## 2024-03-08 ENCOUNTER — Ambulatory Visit: Attending: Medical | Admitting: Medical

## 2024-03-08 ENCOUNTER — Encounter: Payer: Self-pay | Admitting: Medical

## 2024-03-08 VITALS — BP 110/62 | HR 70 | Ht 65.0 in | Wt 146.6 lb

## 2024-03-08 DIAGNOSIS — G901 Familial dysautonomia [Riley-Day]: Secondary | ICD-10-CM | POA: Diagnosis not present

## 2024-03-08 DIAGNOSIS — Z79899 Other long term (current) drug therapy: Secondary | ICD-10-CM

## 2024-03-08 DIAGNOSIS — R0789 Other chest pain: Secondary | ICD-10-CM

## 2024-03-08 DIAGNOSIS — R002 Palpitations: Secondary | ICD-10-CM

## 2024-03-08 DIAGNOSIS — Z8759 Personal history of other complications of pregnancy, childbirth and the puerperium: Secondary | ICD-10-CM

## 2024-03-08 DIAGNOSIS — G90A Postural orthostatic tachycardia syndrome (POTS): Secondary | ICD-10-CM | POA: Diagnosis not present

## 2024-03-08 DIAGNOSIS — O1203 Gestational edema, third trimester: Secondary | ICD-10-CM | POA: Insufficient documentation

## 2024-03-08 NOTE — Progress Notes (Signed)
 " Cardiology Office Note   Date:  03/08/2024  ID:  STERLING Sarah Haney, DOB 06-03-1995, MRN [id number] PCP: Cyrus Selinda Moose, PA-C  [facility] Providers Cardiologist:  None Electrophysiologist:  OLE ONEIDA HOLTS, MD (Inactive)   History of Present Illness Sarah Haney is a 29 y.o. female with a history of syncope, dysautonomia, POTS, orthostatic hypotension who presents for follow-up.   Patient was first seen in 2023 by Dr. Gollan for tachycardia and chest pain.  Echo in 2023 showed EF 60 to 65%, mild MR.  Heart monitor showed normal sinus rhythm average heart rate of 94 bpm, rare ectopy.  Triggered events associated with sinus rhythm, sinus tachycardia.   Patient was seen by Dr. Holts May 2025 reporting dysautonomia suspected POTS and orthostatic hypotension. It was recommended she liberalize salt intake, increase water  intake and incorporate aerobic exercise.   The patient was recently admitted for hyperemesis and electrolyte replacement at 15 weeks and 14 days of gestation.  There was no fluid leakage or bleeding observed.  She was given IV hydration, antiemetics, and electrolytes.  She was seen in the office 09/12/23 and was better since hospitalization. She was referred to the POTS clinic at [facility].   The patient was last seen 11/2023 by EP recommending lifestyle changes for symptoms. DVT for lower leg edema was negative.   Today, the patient is 5 weeks postpartum. She reports POTS symptoms improved towards the end of the pregnancy. She reports irregular heart beats 2-3 times a day. She also feels vague chest heaviness that comes and goes. She notices this more when she lays down. She is breastfeeding. No lower leg edema.  Studies Reviewed EKG Interpretation Date/Time:  Friday March 08 2024 10:49:35 EST Ventricular Rate:  70 PR Interval:  160 QRS Duration:  76 QT Interval:  392 QTC Calculation: 423 R Axis:   43  Text Interpretation: Normal sinus rhythm Low  voltage QRS When compared with ECG of 12-Sep-2023 09:32, No significant change was found Confirmed by Franchester, Kamarah Bilotta ([id number]) on 03/08/2024 10:52:16 AM    Heart monitor 2023 Event monitor Patch Wear Time:  14 days and 0 hours (2023-03-05T13:06:58-0500 to 2023-03-19T14:07:03-398)   Normal sinus rhythm Patient had a min HR of 49 bpm, max HR of 197 bpm, and avg HR of 94 bpm.  Isolated SVEs were rare (<1.0%), and no SVE Couplets or SVE Triplets were present.  Isolated VEs were rare (<1.0%), VE Couplets were rare (<1.0%), and no VE Triplets were present.    Triggered events associated with sinus rhythm, sinus tachycardia   Echo 2023 1. Left ventricular ejection fraction, by estimation, is 60 to 65%. The  left ventricle has normal function. The left ventricle has no regional  wall motion abnormalities. Left ventricular diastolic parameters were  normal. The average left ventricular  global longitudinal strain is -18.0 %. The global longitudinal strain is  normal.   2. Right ventricular systolic function is normal. The right ventricular  size is normal.   3. The mitral valve is normal in structure. Mild mitral valve  regurgitation. No evidence of mitral stenosis.   4. The aortic valve is normal in structure. Aortic valve regurgitation is  trivial. No aortic stenosis is present.   5. The inferior vena cava is normal in size with greater than 50%  respiratory variability, suggesting right atrial pressure of 3 mmHg.       Physical Exam VS:  BP 110/62 (BP Location: Left Arm, Patient Position: Sitting, Cuff Size: Normal)  Pulse 70   Ht 5' 5 (1.651 m)   Wt 146 lb 9.6 oz (66.5 kg)   LMP 05/18/2023   SpO2 98%   BMI 24.40 kg/m        Wt Readings from Last 3 Encounters:  03/08/24 146 lb 9.6 oz (66.5 kg)  02/10/24 154 lb (69.9 kg)  02/01/24 158 lb (71.7 kg)    GEN: Well nourished, well developed in no acute distress NECK: No JVD; No carotid bruits CARDIAC: RRR, no murmurs, rubs,  gallops RESPIRATORY:  Clear to auscultation without rales, wheezing or rhonchi  ABDOMEN: Soft, non-tender, non-distended EXTREMITIES:  No edema; No deformity   ASSESSMENT AND PLAN  Dysautonomia POTS Orthostatic hypotension 5 weeks postpartum Patient reports POTS symptoms improved towards the end of her pregnancy.   Patient has been referred to POTS clinic at [facility].  She has also been followed by EP.   Palpitations Since giving birth she reports intermittent irregular heartbeats that occur 2-3 times a day.I will order a BMET, CBC, TSH, mag.  I will order a 2-week heart monitor.  Chest discomfort She notes a vague chest discomfort that she notices more when lying down.  Pain is not worse with exertion.  Low suspicion for ACS.  I will start with an echocardiogram.  Lower extremity swelling This has resolved.  Prior DVT study was negative.       Dispo: Follow-up in 2 months  Signed, Yared Barefoot VEAR Fishman, PA-C   "

## 2024-03-08 NOTE — Patient Instructions (Signed)
 Medication Instructions:  Your physician recommends that you continue on your current medications as directed. Please refer to the Current Medication list given to you today.   *If you need a refill on your cardiac medications before your next appointment, please call your pharmacy*  Lab Work: Your provider would like for you to have following labs drawn today BMP, CBC, TSH, Mag.   If you have labs (blood work) drawn today and your tests are completely normal, you will receive your results only by: MyChart Message (if you have MyChart) OR A paper copy in the mail If you have any lab test that is abnormal or we need to change your treatment, we will call you to review the results.  Testing/Procedures: Your physician has requested that you have an echocardiogram. Echocardiography is a painless test that uses sound waves to create images of your heart. It provides your doctor with information about the size and shape of your heart and how well your hearts chambers and valves are working.   You may receive an ultrasound enhancing agent through an IV if needed to better visualize your heart during the echo. This procedure takes approximately one hour.  There are no restrictions for this procedure.  This will take place at [address] [facility] [address], [place] [address]  Please note: We ask at that you not bring children with you during ultrasound (echo/ vascular) testing. Due to room size and safety concerns, children are not allowed in the ultrasound rooms during exams. Our front office staff cannot provide observation of children in our lobby area while testing is being conducted. An adult accompanying a patient to their appointment will only be allowed in the ultrasound room at the discretion of the ultrasound technician under special circumstances. We apologize for any inconvenience.   ZIO XT- Long Term Monitor Instructions  Your physician has requested you wear a ZIO patch  monitor for 14 days.  This is a single patch monitor. [employer] supplies one patch monitor per enrollment. Additional stickers are not available. Please do not apply patch if you will be having a Nuclear Stress Test,  Echocardiogram, Cardiac CT, MRI, or Chest Xray during the period you would be wearing the  monitor. The patch cannot be worn during these tests. You cannot remove and re-apply the  ZIO XT patch monitor.  Your ZIO patch monitor will be mailed 3 day USPS to your address on file. It may take 3-5 days  to receive your monitor after you have been enrolled.  Once you have received your monitor, please review the enclosed instructions. Your monitor  has already been registered assigning a specific monitor serial # to you.  Billing and Patient Assistance Program Information  We have supplied [employer] with any of your insurance information on file for billing purposes. [employer] offers a sliding scale Patient Assistance Program for patients that do not have  insurance, or whose insurance does not completely cover the cost of the ZIO monitor.  You must apply for the Patient Assistance Program to qualify for this discounted rate.  To apply, please call [employer] at [phone], select option 4, select option 2, ask to apply for  Patient Assistance Program. Meredeth will ask your household income, and how many people  are in your household. They will quote your out-of-pocket cost based on that information.  [employer] will also be able to set up a 16-month, interest-free payment plan if needed.  Applying the monitor   Shave hair from  upper left chest.  Hold abrader disc by orange tab. Rub abrader in 40 strokes over the upper left chest as  indicated in your monitor instructions.  Clean area with 4 enclosed alcohol pads. Let dry.  Apply patch as indicated in monitor instructions. Patch will be placed under collarbone on left  side of chest with arrow pointing upward.  Rub patch adhesive wings  for 2 minutes. Remove white label marked 1. Remove the white  label marked 2. Rub patch adhesive wings for 2 additional minutes.  While looking in a mirror, press and release button in center of patch. A small green light will  flash 3-4 times. This will be your only indicator that the monitor has been turned on.  Do not shower for the first 24 hours. You may shower after the first 24 hours.  Press the button if you feel a symptom. You will hear a small click. Record Date, Time and  Symptom in the Patient Logbook.  When you are ready to remove the patch, follow instructions on the last 2 pages of Patient  Logbook. Stick patch monitor onto the last page of Patient Logbook.  Place Patient Logbook in the blue and white box. Use locking tab on box and tape box closed  securely. The blue and white box has prepaid postage on it. Please place it in the mailbox as  soon as possible. Your physician should have your test results approximately 7 days after the  monitor has been mailed back to [facility].  Call [facility] Customer Care at [phone] if you have questions regarding  your ZIO XT patch monitor. Call them immediately if you see an orange light blinking on your  monitor.  If your monitor falls off in less than 4 days, contact our Monitor department at [phone].  If your monitor becomes loose or falls off after 4 days call [employer] at [phone] for  suggestions on securing your monitor    Follow-Up: At [facility], you and your health needs are our priority.  As part of our continuing mission to provide you with exceptional heart care, our providers are all part of one team.  This team includes your primary Cardiologist (physician) and Advanced Practice Providers or APPs (Physician Assistants and Nurse Practitioners) who all work together to provide you with the care you need, when you need it.  Your next appointment:   2 month(s)  Provider:   Mikey Fishman, PA-C

## 2024-03-09 LAB — CBC
Hematocrit: 42 % (ref 34.0–46.6)
Hemoglobin: 13.5 g/dL (ref 11.1–15.9)
MCH: 31 pg (ref 26.6–33.0)
MCHC: 32.1 g/dL (ref 31.5–35.7)
MCV: 96 fL (ref 79–97)
Platelets: 241 x10E3/uL (ref 150–450)
RBC: 4.36 x10E6/uL (ref 3.77–5.28)
RDW: 13.1 % (ref 11.7–15.4)
WBC: 6.1 x10E3/uL (ref 3.4–10.8)

## 2024-03-09 LAB — BASIC METABOLIC PANEL WITH GFR
BUN/Creatinine Ratio: 19 (ref 9–23)
BUN: 16 mg/dL (ref 6–20)
CO2: 21 mmol/L (ref 20–29)
Calcium: 9.3 mg/dL (ref 8.7–10.2)
Chloride: 103 mmol/L (ref 96–106)
Creatinine, Ser: 0.85 mg/dL (ref 0.57–1.00)
Glucose: 71 mg/dL (ref 70–99)
Potassium: 4.1 mmol/L (ref 3.5–5.2)
Sodium: 140 mmol/L (ref 134–144)
eGFR: 96 mL/min/1.73

## 2024-03-09 LAB — TSH: TSH: 2.62 u[IU]/mL (ref 0.450–4.500)

## 2024-03-09 LAB — MAGNESIUM: Magnesium: 2.2 mg/dL (ref 1.6–2.3)

## 2024-03-11 ENCOUNTER — Ambulatory Visit: Payer: Self-pay | Admitting: Medical

## 2024-03-19 ENCOUNTER — Other Ambulatory Visit: Payer: Self-pay | Admitting: Medical

## 2024-03-19 DIAGNOSIS — Z79899 Other long term (current) drug therapy: Secondary | ICD-10-CM

## 2024-03-19 DIAGNOSIS — G901 Familial dysautonomia [Riley-Day]: Secondary | ICD-10-CM

## 2024-03-19 DIAGNOSIS — R0789 Other chest pain: Secondary | ICD-10-CM

## 2024-03-19 DIAGNOSIS — O1203 Gestational edema, third trimester: Secondary | ICD-10-CM

## 2024-03-19 DIAGNOSIS — G90A Postural orthostatic tachycardia syndrome (POTS): Secondary | ICD-10-CM

## 2024-03-19 DIAGNOSIS — R002 Palpitations: Secondary | ICD-10-CM

## 2024-03-20 LAB — TYPE AND SCREEN
ABO/RH(D): O NEG
Antibody Screen: POSITIVE
Donor AG Type: NEGATIVE
Donor AG Type: NEGATIVE
Unit division: 0
Unit division: 0

## 2024-03-22 ENCOUNTER — Ambulatory Visit

## 2024-05-09 ENCOUNTER — Ambulatory Visit: Admitting: Medical
